# Patient Record
Sex: Female | Born: 1995 | Race: White | Hispanic: No | Marital: Single | Smoking: Never smoker
Health system: Southern US, Community
[De-identification: ages and names within clinical notes are randomized; demographics above are authoritative.]

## PROBLEM LIST (undated history)

## (undated) DIAGNOSIS — S060XAA Concussion with loss of consciousness status unknown, initial encounter: Secondary | ICD-10-CM

## (undated) DIAGNOSIS — F419 Anxiety disorder, unspecified: Secondary | ICD-10-CM

## (undated) DIAGNOSIS — S060X9A Concussion with loss of consciousness of unspecified duration, initial encounter: Secondary | ICD-10-CM

## (undated) DIAGNOSIS — R55 Syncope and collapse: Secondary | ICD-10-CM

## (undated) DIAGNOSIS — T7421XA Adult sexual abuse, confirmed, initial encounter: Secondary | ICD-10-CM

## (undated) DIAGNOSIS — Z20828 Contact with and (suspected) exposure to other viral communicable diseases: Secondary | ICD-10-CM

## (undated) DIAGNOSIS — J45909 Unspecified asthma, uncomplicated: Secondary | ICD-10-CM

## (undated) DIAGNOSIS — G43909 Migraine, unspecified, not intractable, without status migrainosus: Secondary | ICD-10-CM

## (undated) DIAGNOSIS — A770 Spotted fever due to Rickettsia rickettsii: Secondary | ICD-10-CM

## (undated) HISTORY — DX: Unspecified asthma, uncomplicated: J45.909

## (undated) HISTORY — DX: Spotted fever due to Rickettsia rickettsii: A77.0

## (undated) HISTORY — DX: Concussion with loss of consciousness status unknown, initial encounter: S06.0XAA

## (undated) HISTORY — DX: Adult sexual abuse, confirmed, initial encounter: T74.21XA

## (undated) HISTORY — DX: Syncope and collapse: R55

## (undated) HISTORY — DX: Anxiety disorder, unspecified: F41.9

## (undated) HISTORY — DX: Contact with and (suspected) exposure to other viral communicable diseases: Z20.828

## (undated) HISTORY — DX: Migraine, unspecified, not intractable, without status migrainosus: G43.909

## (undated) HISTORY — PX: TONSILLECTOMY AND ADENOIDECTOMY: SHX28

## (undated) HISTORY — DX: Concussion with loss of consciousness of unspecified duration, initial encounter: S06.0X9A

---
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
## 2015-01-14 ENCOUNTER — Other Ambulatory Visit: Payer: Self-pay | Admitting: Nurse Practitioner

## 2015-01-14 MED ORDER — DESOGESTREL-ETHINYL ESTRADIOL 0.15-0.02/0.01 MG (21/5) PO TABS: 1.0000 | ORAL_TABLET | Freq: Every day | ORAL | Status: DC

## 2015-01-14 NOTE — Telephone Encounter (Signed)
pts mother scheduled pts aex with dr Quincy Simmonds for 6/24. Pt normally sees [facility], but not able to wait until next available due to school schedule. Pts mother requests refill on birth control until appointment. Pt will run out this week.  [facility] [address]

## 2015-01-14 NOTE — Telephone Encounter (Signed)
Medication refill request: Desiree Carlson (Birth Control) Last AEX:   Next AEX: 01-24-15 Last MMG (if hormonal medication request): age 20 Refill authorized: please advise

## 2015-01-24 ENCOUNTER — Encounter: Payer: Self-pay | Admitting: Obstetrics and Gynecology

## 2015-01-24 ENCOUNTER — Ambulatory Visit (INDEPENDENT_AMBULATORY_CARE_PROVIDER_SITE_OTHER): Payer: BLUE CROSS/BLUE SHIELD | Admitting: Obstetrics and Gynecology

## 2015-01-24 VITALS — BP 110/68 | HR 68 | Resp 18 | Ht 67.5 in | Wt 191.0 lb

## 2015-01-24 DIAGNOSIS — Z113 Encounter for screening for infections with a predominantly sexual mode of transmission: Secondary | ICD-10-CM | POA: Diagnosis not present

## 2015-01-24 DIAGNOSIS — Z01419 Encounter for gynecological examination (general) (routine) without abnormal findings: Secondary | ICD-10-CM

## 2015-01-24 MED ORDER — NORELGESTROMIN-ETH ESTRADIOL 150-35 MCG/24HR TD PTWK: 1.0000 | MEDICATED_PATCH | TRANSDERMAL | Status: DC

## 2015-01-24 NOTE — Patient Instructions (Signed)

## 2015-01-24 NOTE — Progress Notes (Signed)
19 y.o. G0P0 Single Caucasian female here for annual exam.    Studying at [employer].  Works as Automotive engineer at [employer].   Became sexually active in the last year.   Less cramping with birth control pills than off of them.  Difficulty with taking it on time.  Can vary by 3 - 4 hours. Completely forgot until the next day 3 - 4 times.   PCP:   Patsi Sears   Patient's last menstrual period was 01/17/2015.          Sexually active: Yes.    The current method of family planning is OCP (estrogen/progesterone).    Exercising: Yes.    cardio, weights, Sports  Smoker:  no  Health Maintenance: Pap:  N/A Gardasil Completed 2014  TDaP:  UTD Screening Labs:  Hb today: PCP, Urine today: PCP   reports that she has never smoked. She has never used smokeless tobacco. She reports that she does not drink alcohol or use illicit drugs.  Past Medical History  Diagnosis Date  . Vasovagal syncope     under cardiology care  . Concussion     playing soccer  . Asthma     usually sports induced    Past Surgical History  Procedure Laterality Date  . Tonsillectomy and adenoidectomy      obstructing airway    Current Outpatient Prescriptions  Medication Sig Dispense Refill  . amitriptyline (ELAVIL) 50 MG tablet Take 1 tablet (50 mg total) by mouth at bedtime. (Take 25 mg by mouth every night for the first 2 weeks) 30 tablet 3  . cetirizine (ZYRTEC) 10 MG tablet Take 10 mg by mouth at bedtime.    Marland Kitchen desogestrel-ethinyl estradiol (KARIVA,AZURETTE,MIRCETTE) 0.15-0.02/0.01 MG (21/5) tablet Take 1 tablet by mouth daily. 1 Package 0  . Magnesium Oxide 500 MG TABS Take by mouth.    . Melatonin 5 MG TABS Take by mouth.    . montelukast (SINGULAIR) 10 MG tablet Take 1 tablet by mouth daily.    . mupirocin ointment (BACTROBAN) 2 % APPLY TO AFFECTED SKIN 3 TIMES A DAY  2  . vitamin B-12 (CYANOCOBALAMIN) 1000 MCG tablet Take 1,000 mcg by mouth daily.    . cephALEXin (KEFLEX) 500 MG capsule      . Coenzyme Q10 200 MG TABS Take by mouth.    . propranolol (INDERAL) 20 MG tablet Take 1 tablet (20 mg total) by mouth 2 (two) times daily. (Start with one tablet by mouth each bedtime for the first week) (Patient not taking: Reported on 01/24/2015) 60 tablet 3   No current facility-administered medications for this visit.    Family History  Problem Relation Age of Onset  . Hyperlipidemia Father   . Supraventricular tachycardia Maternal Grandmother   . COPD Paternal Grandmother   . Migraines Paternal Grandmother   . Diabetes Paternal Grandfather   . Seizures Mother     Had 2 febrile seizures as a child  . Anxiety disorder Brother   . Anxiety disorder Other     Maternal 1st Cousin    ROS:  Pertinent items are noted in HPI.  Otherwise, a comprehensive ROS was negative.  Exam:   BP 110/68 mmHg  Pulse 68  Resp 18  Ht 5' 7.5" (1.715 m)  Wt 191 lb (86.637 kg)  BMI 29.46 kg/m2  LMP 01/17/2015    General appearance: alert, cooperative and appears stated age Head: Normocephalic, without obvious abnormality, atraumatic Neck: no adenopathy, supple, symmetrical, trachea midline  and thyroid normal to inspection and palpation Lungs: clear to auscultation bilaterally Breasts: normal appearance, no masses or tenderness, Inspection negative, No nipple retraction or dimpling, No nipple discharge or bleeding, No axillary or supraclavicular adenopathy Heart: regular rate and rhythm Abdomen: soft, non-tender; bowel sounds normal; no masses,  no organomegaly Extremities: extremities normal, atraumatic, no cyanosis or edema Skin: Skin color, texture, turgor normal. No rashes or lesions Lymph nodes: Cervical, supraclavicular, and axillary nodes normal. No abnormal inguinal nodes palpated Neurologic: Grossly normal  Pelvic: External genitalia:  no lesions              Urethra:  normal appearing urethra with no masses, tenderness or lesions              Bartholins and Skenes: normal                  Vagina: normal appearing vagina with normal color and discharge, no lesions              Cervix: no lesions              Pap taken: No. Bimanual Exam:  Uterus:  normal size, contour, position, consistency, mobility, non-tender              Adnexa: normal adnexa and no mass, fullness, tenderness              Rectovaginal: No..  Confirms.              Anus:  normal sphincter tone, no lesions  Chaperone was present for exam.  Assessment:   Well woman visit with normal exam. Need for STD testing.  Need for reliable contraception.   Plan: Yearly mammogram recommended after age 17.  Recommended self breast exam.  Pap and HR HPV as above.  Due at age 3. Discussed Calcium, Vitamin D, regular exercise program including cardiovascular and weight bearing exercise. Labs performed.  Yes.  .   See orders.  GC/CT done.  Future orders for serum STD testing.  Refills given on medications.  Yes.  .  See orders.  Switch to Ortho Evra.  Instructed in use.  May consider the [facility] IUD. Condoms discussed. Follow up annually and prn.     After visit summary provided.

## 2015-01-29 LAB — IPS N GONORRHOEA AND CHLAMYDIA BY PCR

## 2015-01-30 ENCOUNTER — Telehealth: Payer: Self-pay

## 2015-01-30 NOTE — Telephone Encounter (Signed)
Called patient at [phone] to discuss Neg. GC/CT results, LMOVM to call me back.

## 2015-01-30 NOTE — Telephone Encounter (Signed)
Patient notified GC/CT negative.

## 2015-01-30 NOTE — Telephone Encounter (Signed)
-----   Message from Nunzio Cobbs, MD sent at 01/30/2015  9:37 AM EDT ----- Please report negative GC/CT to patient.

## 2015-02-10 ENCOUNTER — Other Ambulatory Visit (INDEPENDENT_AMBULATORY_CARE_PROVIDER_SITE_OTHER): Payer: BLUE CROSS/BLUE SHIELD

## 2015-02-10 DIAGNOSIS — Z113 Encounter for screening for infections with a predominantly sexual mode of transmission: Secondary | ICD-10-CM

## 2015-02-11 LAB — STD PANEL
HIV: NONREACTIVE
Hepatitis B Surface Ag: NEGATIVE

## 2015-02-11 LAB — HEPATITIS C ANTIBODY: HCV AB: NEGATIVE

## 2015-03-04 ENCOUNTER — Other Ambulatory Visit: Payer: Self-pay | Admitting: Obstetrics and Gynecology

## 2015-03-04 NOTE — Telephone Encounter (Signed)
I spoke to patient and advised Dr. Quincy Simmonds sent in one year supply of Ortho Evra on 01/24/15.  Advised [facility]  [facility]

## 2015-03-04 NOTE — Telephone Encounter (Signed)
Patient is requesting 3 refills of "patches". confirmed pharmacy on file.

## 2015-03-12 DIAGNOSIS — A770 Spotted fever due to Rickettsia rickettsii: Secondary | ICD-10-CM | POA: Insufficient documentation

## 2015-03-27 ENCOUNTER — Telehealth: Payer: Self-pay | Admitting: Obstetrics and Gynecology

## 2015-03-27 NOTE — Telephone Encounter (Signed)
I think it would be good for the patient to call office directly.  I would recommend a pregnancy test if patient is sexually active and call if the test is positive.  Does she want to switch back to an OCP?

## 2015-03-27 NOTE — Telephone Encounter (Signed)
Spoke with patient's mother Desiree Carlson. Okay per ROI. Mother states that daughter has been experiencing increased pelvic cramping like she is going to start her cycle and abdominal cramping ever since starting the Ortho Evra patch. Patient started the patch on 02/17/2015. Mother is unable to advise if patient has been changing her patch weekly and alternating locations. Patient is away at college and in class so she is unable to talk. Mother states that patient is having intermittent sharp pains around the area of the patch. Patient was previously on OCP and had mild cramping. Patient is not experiencing any nausea, vomiting, or fever per mother. Advised will speak with Dr.Silva regarding patient's symptoms and return call. Mother is agreeable.

## 2015-03-27 NOTE — Telephone Encounter (Signed)
Spoke with patient's mother Abigail Butts. Mother states that patient is in class today until 4 pm. She will notify her daughter to call the office after class. Advised phones are on today until 4:30 pm. Mother is agreeable.

## 2015-03-27 NOTE — Telephone Encounter (Signed)
Patient's mom "Abigail Butts" calling to discuss patient problems with her "patch". Abigail Butts says her daughter "keeps texting her that she is in pain and believes it is due to her patch". Dpr on file to talk with mom. Last seen 02/10/15.

## 2015-03-27 NOTE — Telephone Encounter (Signed)
Patient's mom, Abigail Butts, called back and said, "My daughter has been changing her patch weekly and switching it from side to side. She continues to have abdominal pain and uterine pain."

## 2015-03-31 NOTE — Telephone Encounter (Signed)
Left message to call Kaitlyn at [phone]. 

## 2015-04-03 NOTE — Telephone Encounter (Signed)
Left message to call Kaitlyn at [phone]. 

## 2015-04-09 NOTE — Telephone Encounter (Signed)
Spoke with patient's mother Desiree Carlson. Desiree Carlson states she is calling for refills on patient's Ortho Evra. Advised mother patient was given 12 refills at aex. Mother is agreeable and will have this rx transferred closer to the patient's school. Advised mother that if patient is still having problems with patch she needs to be seen for evaluation. Mother states that the patient does not want to come off of the patch at this time but may want to try alternatives at a later time. Mother is asking if patient could email or text our office. Advised may use mychart to communicate with our office. Mychart message access code sent for patient sign up. Mother will have patient sign up for future use. Advised patient will need to call our office with any concerns or problems. Can also seek urgent care or care at school if needed. Mother is agreeable.  Routing to Dr.Silva for review before closing encounter. I have been unable to reach the patient regarding her symptoms.

## 2015-04-09 NOTE — Telephone Encounter (Signed)
DPI form has been followed correctly.  Patient responsibility to call or contact us for problems.  Great suggesting for use of My Chart for patient!  OK to close encounter.

## 2015-05-06 ENCOUNTER — Telehealth: Payer: Self-pay | Admitting: Nurse Practitioner

## 2015-05-06 NOTE — Telephone Encounter (Signed)
Np/cx/rd for med reck. Spoke with Desiree Carlson she will have her mom to call back to reschhedule appt.

## 2015-05-07 ENCOUNTER — Ambulatory Visit: Payer: BLUE CROSS/BLUE SHIELD | Admitting: Nurse Practitioner

## 2015-05-08 NOTE — Telephone Encounter (Signed)
Patient canceled her appointment for med reck will call back to reschedule.

## 2015-05-09 ENCOUNTER — Ambulatory Visit: Payer: BLUE CROSS/BLUE SHIELD | Admitting: Nurse Practitioner

## 2015-08-12 ENCOUNTER — Ambulatory Visit: Payer: Self-pay | Admitting: Internal Medicine

## 2015-09-10 ENCOUNTER — Encounter: Payer: Self-pay | Admitting: *Deleted

## 2015-09-19 ENCOUNTER — Ambulatory Visit (INDEPENDENT_AMBULATORY_CARE_PROVIDER_SITE_OTHER): Payer: BLUE CROSS/BLUE SHIELD | Admitting: Internal Medicine

## 2015-09-19 ENCOUNTER — Encounter: Payer: Self-pay | Admitting: Internal Medicine

## 2015-09-19 VITALS — BP 116/72 | HR 63 | Ht 68.5 in | Wt 190.0 lb

## 2015-09-19 DIAGNOSIS — R55 Syncope and collapse: Secondary | ICD-10-CM | POA: Diagnosis not present

## 2015-09-19 NOTE — Progress Notes (Signed)
HPI [name] is referred today by Dr. Velna Hatchet for evaluation of syncope. She is a pleasant 20 yo woman with a h/o syncope dating back 3 years. She notes that she has passed out at least 6 times. The episodes are somewhat variable but usually associated with upright posture and related to HA. Her last episode occurred after standing for many hours at a concert. She did not have much fluid and it was warm. She was leaving the concert with a friend and passed out. She feels associated nausea and vomiting. HA is a prominent component and typically occurs after the episodes. She will feel washed out for up to a day or two. She has not lost continence or bitten her tongue. No associated sensation of palpitations. They have never occurred while driving or sitting. No Known Allergies   Current Outpatient Prescriptions  Medication Sig Dispense Refill  . cetirizine (ZYRTEC) 10 MG tablet Take 10 mg by mouth at bedtime.    . Melatonin 5 MG TABS Take by mouth.    . montelukast (SINGULAIR) 10 MG tablet Take 1 tablet by mouth daily.    . norelgestromin-ethinyl estradiol (ORTHO EVRA) 150-35 MCG/24HR transdermal patch Place 1 patch onto the skin once a week. 3 patch 12  . propranolol (INDERAL) 20 MG tablet Take 1 tablet (20 mg total) by mouth 2 (two) times daily. (Start with one tablet by mouth each bedtime for the first week) 60 tablet 3  . vitamin B-12 (CYANOCOBALAMIN) 1000 MCG tablet Take 1,000 mcg by mouth daily.     No current facility-administered medications for this visit.     Past Medical History  Diagnosis Date  . Vasovagal syncope     under cardiology care  . Concussion     playing soccer  . Asthma     usually sports induced    ROS:   All systems reviewed and negative except as noted in the HPI.   Past Surgical History  Procedure Laterality Date  . Tonsillectomy and adenoidectomy      obstructing airway     Family History  Problem Relation Age of Onset  .  Supraventricular tachycardia Maternal Grandmother   . COPD Paternal Grandmother   . Migraines Paternal Grandmother   . Diabetes Paternal Grandfather   . Seizures Mother     Had 2 febrile seizures as a child  . Anxiety disorder Brother   . Anxiety disorder Other     Maternal 1st Cousin  . Hyperlipidemia Father   . Tuberculosis Mother      Social History   Social History  . Marital Status: Single    Spouse Name: N/A  . Number of Children: N/A  . Years of Education: N/A   Occupational History  . Not on file.   Social History Main Topics  . Smoking status: Never Smoker   . Smokeless tobacco: Never Used  . Alcohol Use: No  . Drug Use: No  . Sexual Activity: Yes    Birth Control/ Protection: Pill   Other Topics Concern  . Not on file   Social History Narrative     BP 116/72 mmHg  Pulse 63  Ht 5' 8.5" (1.74 m)  Wt 190 lb (86.183 kg)  BMI 28.47 kg/m2 Orthostatic vitals: HR went from 60 to 80 with standing. Blood pressure dropped almost 10 mmHg with standing. No hypotension however. Physical Exam:  Well appearing young woman, NAD HEENT: Unremarkable Neck:  6 cm JVD, no thyromegally  Lymphatics:  No adenopathy Back:  No CVA tenderness Lungs:  Clear with no wheezes HEART:  Regular rate rhythm, no murmurs, no rubs, no clicks Abd:  soft, positive bowel sounds, no organomegally, no rebound, no guarding Ext:  2 plus pulses, no edema, no cyanosis, no clubbing Skin:  No rashes no nodules Neuro:  CN II through XII intact, motor grossly intact  EKG - nsr with no pre-excitation   Assess/Plan: 1. Syncope 2. Autonomic dysfunction/vasovagal syncope 3. Migraine HA's. Rec: Today we discussed the pathophysiology of autonomic dysfunction/Vasovagal syncope in detail. I have recommended she avoid the usual noxious stimuli as much as possible, drink plenty of fluids and eat plenty of salt, avoid ETOH and caffeine, not miss meals, and most importantly, when she feels a spell coming  on, to lie down. I will see her back on an as needed basis.  There was a question about anti-anxiety meds to help with situational anxiety that she experiences. From my perspective these medications will not make her spells worse but would need to be prescribed with great caution. I will defer to her medical/HA MD's.   Gregg Taylor,M.D.  Mikle Bosworth.D.

## 2015-09-19 NOTE — Patient Instructions (Addendum)
Medication Instructions:  Your physician recommends that you continue on your current medications as directed. Please refer to the Current Medication list given to you today.  Labwork: None ordered  Testing/Procedures: None ordered  Follow-Up: No follow up is needed at this time with Dr. Lovena Le.  He will see you on an as needed basis.  Any Other Special Instructions Will Be Listed Below (If Applicable). POTS booklet given to you today.  If you need a refill on your cardiac medications before your next appointment, please call your pharmacy.  Thank you for choosing [facility]!!

## 2015-09-29 ENCOUNTER — Other Ambulatory Visit: Payer: Self-pay | Admitting: Obstetrics and Gynecology

## 2015-10-05 ENCOUNTER — Encounter (HOSPITAL_BASED_OUTPATIENT_CLINIC_OR_DEPARTMENT_OTHER): Payer: Self-pay | Admitting: *Deleted

## 2015-10-05 ENCOUNTER — Emergency Department (HOSPITAL_BASED_OUTPATIENT_CLINIC_OR_DEPARTMENT_OTHER): Payer: BLUE CROSS/BLUE SHIELD

## 2015-10-05 ENCOUNTER — Emergency Department (HOSPITAL_BASED_OUTPATIENT_CLINIC_OR_DEPARTMENT_OTHER)
Admission: EM | Admit: 2015-10-05 | Discharge: 2015-10-05 | Disposition: A | Discharge status: Home / Self Care | Payer: BLUE CROSS/BLUE SHIELD | Attending: Emergency Medicine | Admitting: Emergency Medicine

## 2015-10-05 DIAGNOSIS — R1010 Upper abdominal pain, unspecified: Secondary | ICD-10-CM | POA: Diagnosis present

## 2015-10-05 DIAGNOSIS — R1013 Epigastric pain: Secondary | ICD-10-CM

## 2015-10-05 DIAGNOSIS — Z79899 Other long term (current) drug therapy: Secondary | ICD-10-CM | POA: Diagnosis not present

## 2015-10-05 DIAGNOSIS — J45909 Unspecified asthma, uncomplicated: Secondary | ICD-10-CM | POA: Diagnosis not present

## 2015-10-05 DIAGNOSIS — Z792 Long term (current) use of antibiotics: Secondary | ICD-10-CM | POA: Diagnosis not present

## 2015-10-05 DIAGNOSIS — Z3202 Encounter for pregnancy test, result negative: Secondary | ICD-10-CM | POA: Diagnosis not present

## 2015-10-05 DIAGNOSIS — Z87828 Personal history of other (healed) physical injury and trauma: Secondary | ICD-10-CM | POA: Diagnosis not present

## 2015-10-05 LAB — COMPREHENSIVE METABOLIC PANEL
ALBUMIN: 4 g/dL (ref 3.5–5.0)
ALT: 17 U/L (ref 14–54)
AST: 18 U/L (ref 15–41)
Alkaline Phosphatase: 56 U/L (ref 38–126)
Anion gap: 10 (ref 5–15)
BUN: 14 mg/dL (ref 6–20)
CHLORIDE: 103 mmol/L (ref 101–111)
CO2: 24 mmol/L (ref 22–32)
CREATININE: 0.86 mg/dL (ref 0.44–1.00)
Calcium: 8.9 mg/dL (ref 8.9–10.3)
GFR calc Af Amer: >60 mL/min (ref >60)
GFR calc non Af Amer: >60 mL/min (ref >60)
Glucose, Bld: 99 mg/dL (ref 65–99)
Potassium: 4.1 mmol/L (ref 3.5–5.1)
SODIUM: 137 mmol/L (ref 135–145)
Total Bilirubin: 0.4 mg/dL (ref 0.3–1.2)
Total Protein: 7.5 g/dL (ref 6.5–8.1)

## 2015-10-05 LAB — CBC WITH DIFFERENTIAL/PLATELET
Basophils Absolute: 0 10*3/uL (ref 0.0–0.1)
Basophils Relative: 0 %
EOS ABS: 0.1 10*3/uL (ref 0.0–0.7)
Eosinophils Relative: 1 %
HEMATOCRIT: 36.7 % (ref 36.0–46.0)
HEMOGLOBIN: 12.2 g/dL (ref 12.0–15.0)
LYMPHS ABS: 1.6 10*3/uL (ref 0.7–4.0)
Lymphocytes Relative: 16 %
MCH: 28.6 pg (ref 26.0–34.0)
MCHC: 33.2 g/dL (ref 30.0–36.0)
MCV: 85.9 fL (ref 78.0–100.0)
MONO ABS: 0.6 10*3/uL (ref 0.1–1.0)
MONOS PCT: 6 %
NEUTROS ABS: 7.9 10*3/uL — AB (ref 1.7–7.7)
NEUTROS PCT: 77 %
Platelets: 272 10*3/uL (ref 150–400)
RBC: 4.27 MIL/uL (ref 3.87–5.11)
RDW: 12.8 % (ref 11.5–15.5)
WBC: 10.2 10*3/uL (ref 4.0–10.5)

## 2015-10-05 LAB — URINALYSIS, ROUTINE W REFLEX MICROSCOPIC
Bilirubin Urine: NEGATIVE
GLUCOSE, UA: NEGATIVE mg/dL
HGB URINE DIPSTICK: NEGATIVE
KETONES UR: NEGATIVE mg/dL
LEUKOCYTES UA: NEGATIVE
Nitrite: NEGATIVE
PH: 6.5 (ref 5.0–8.0)
Protein, ur: NEGATIVE mg/dL
Specific Gravity, Urine: 1.009 (ref 1.005–1.030)

## 2015-10-05 LAB — PREGNANCY, URINE: Preg Test, Ur: NEGATIVE

## 2015-10-05 LAB — LIPASE, BLOOD: Lipase: 30 U/L (ref 11–51)

## 2015-10-05 MED ORDER — IBUPROFEN 400 MG PO TABS
600.0000 mg | ORAL_TABLET | Freq: Once | ORAL | Status: AC
  Administered 2015-10-05: 600 mg via ORAL
  Filled 2015-10-05: qty 1

## 2015-10-05 NOTE — ED Notes (Signed)
Pt reports upper abd pain that began yesterday, worse w/ movement and deep inspiration - pt denies any n/v/d, vaginal discharge/bleeding or fever. Pt admits she was seen on Friday for sinus infection and pink eye - pt was started on amoxicillin at that time.

## 2015-10-05 NOTE — Discharge Instructions (Signed)
We did not find serious cause of your pain today. The pain that you describe may be more musculoskeletal in nature. Continue to take tylenol and motrin as needed for pain control  Return without fail for worsening symptoms, including fever, vomiting and unable to keep down food/fluids, worsening pain, or any other symptoms concerning to you.   Abdominal Pain, Adult Many things can cause belly (abdominal) pain. Most times, the belly pain is not dangerous. Many cases of belly pain can be watched and treated at home. HOME CARE   Do not take medicines that help you go poop (laxatives) unless told to by your doctor.  Only take medicine as told by your doctor.  Eat or drink as told by your doctor. Your doctor will tell you if you should be on a special diet. GET HELP IF:  You do not know what is causing your belly pain.  You have belly pain while you are sick to your stomach (nauseous) or have runny poop (diarrhea).  You have pain while you pee or poop.  Your belly pain wakes you up at night.  You have belly pain that gets worse or better when you eat.  You have belly pain that gets worse when you eat fatty foods.  You have a fever. GET HELP RIGHT AWAY IF:   The pain does not go away within 2 hours.  You keep throwing up (vomiting).  The pain changes and is only in the right or left part of the belly.  You have bloody or tarry looking poop. MAKE SURE YOU:   Understand these instructions.  Will watch your condition.  Will get help right away if you are not doing well or get worse.   This information is not intended to replace advice given to you by your health care provider. Make sure you discuss any questions you have with your health care provider.   Document Released: 01/05/2008 Document Revised: 08/09/2014 Document Reviewed: 03/28/2013 Elsevier Interactive Patient Education [employer].

## 2015-10-05 NOTE — ED Notes (Signed)
Patient transported to X-ray 

## 2015-10-05 NOTE — ED Notes (Signed)
DC instructions reviewed with pt, opportunity for questions provided

## 2015-10-05 NOTE — ED Provider Notes (Signed)
CSN: [id number]     Arrival date & time 10/05/15  1323 History   First MD Initiated Contact with Patient 10/05/15 1339     Chief Complaint  Patient presents with  . Abdominal Pain     (Consider location/radiation/quality/duration/timing/severity/associated sxs/prior Treatment) HPI 20 year old female who presents with epigastric abdominal pain. History of asthma and no prior abdominal surgeries. States that had upper abdominal pain yesterday while at rest, initially noted when she was moving. States that she also noticed that pain was worsened when she took a deep breath in. Just prior to this also noted chest pain underneath her left breast that also was worse with movement and deep breathing prior to onset of abdominal pain. Has not had any nausea, vomiting, diarrhea, dysuria, urinary frequency hematuria, flank pain or back pain, vaginal bleeding or abnormal vaginal discharge. Recently has had of viral sinus infection and has had nasal congestion and runny nose. Had mild nonproductive cough but no fevers or chills. No difficulty breathing. No recent immobilization, recent traveling, personal or family history of PE/DVT, lower extremity edema or pain. Past Medical History  Diagnosis Date  . Vasovagal syncope     under cardiology care  . Concussion     playing soccer  . Asthma     usually sports induced   Past Surgical History  Procedure Laterality Date  . Tonsillectomy and adenoidectomy      obstructing airway   Family History  Problem Relation Age of Onset  . Supraventricular tachycardia Maternal Grandmother   . COPD Paternal Grandmother   . Migraines Paternal Grandmother   . Diabetes Paternal Grandfather   . Seizures Mother     Had 2 febrile seizures as a child  . Anxiety disorder Brother   . Anxiety disorder Other     Maternal 1st Cousin  . Hyperlipidemia Father   . Tuberculosis Mother    Social History  Substance Use Topics  . Smoking status: Never Smoker   . Smokeless  tobacco: Never Used  . Alcohol Use: No   OB History    Gravida Para Term Preterm AB TAB SAB Ectopic Multiple Living   0 0             Review of Systems 10/14 systems reviewed and are negative other than those stated in the HPI   Allergies  Review of patient's allergies indicates no known allergies.  Home Medications   Prior to Admission medications   Medication Sig Start Date End Date Taking? Authorizing Provider  amoxicillin-clavulanate (AUGMENTIN) 875-125 MG tablet Take 1 tablet by mouth 2 (two) times daily.   Yes Historical Provider, MD  cetirizine (ZYRTEC) 10 MG tablet Take 10 mg by mouth at bedtime.   Yes Historical Provider, MD  Melatonin 5 MG TABS Take by mouth.   Yes Historical Provider, MD  montelukast (SINGULAIR) 10 MG tablet Take 1 tablet by mouth daily. 12/13/12  Yes Historical Provider, MD  tobramycin (TOBREX) 0.3 % ophthalmic ointment 1 application 3 (three) times daily.   Yes Historical Provider, MD  Marilu Favre 150-35 MCG/24HR transdermal patch PLACE 1 PATCH ONTO THE SKIN ONCE A WEEK. 09/29/15  Yes Brook Oletta Lamas, MD  propranolol (INDERAL) 20 MG tablet Take 1 tablet (20 mg total) by mouth 2 (two) times daily. (Start with one tablet by mouth each bedtime for the first week) 05/29/13   Teressa Lower, MD  vitamin B-12 (CYANOCOBALAMIN) 1000 MCG tablet Take 1,000 mcg by mouth daily.    Historical Provider, MD  BP 128/92 mmHg  Pulse 76  Temp(Src) 98.3 F (36.8 C) (Oral)  Resp 16  Ht 5\' 8"  (1.727 m)  Wt 189 lb (85.73 kg)  BMI 28.74 kg/m2  SpO2 100%  LMP 09/26/2015 Physical Exam Physical Exam  Nursing note and vitals reviewed. Constitutional: Well developed, well nourished, non-toxic, and in no acute distress Head: Normocephalic and atraumatic.  Mouth/Throat: Oropharynx is clear and moist.  Neck: Normal range of motion. Neck supple.  Cardiovascular: Normal rate and regular rhythm.   Pulmonary/Chest: Effort normal and breath sounds normal.  Abdominal: Soft.  There is epigastri tenderness. There is no rebound and no guarding. No tenderness at McBurney's point. Negative Murphy's sign. Musculoskeletal: Normal range of motion.  Neurological: Alert, no facial droop, fluent speech, moves all extremities symmetrically Skin: Skin is warm and dry.  Psychiatric: Cooperative  ED Course  Procedures (including critical care time) Labs Review Labs Reviewed  CBC WITH DIFFERENTIAL/PLATELET - Abnormal; Notable for the following:    Neutro Abs 7.9 (*)    All other components within normal limits  URINALYSIS, ROUTINE W REFLEX MICROSCOPIC (NOT AT [facility])  PREGNANCY, URINE  COMPREHENSIVE METABOLIC PANEL  LIPASE, BLOOD    Imaging Review Dg Chest 2 View  10/05/2015  CLINICAL DATA:  Epigastric abdominal pain. Pain worsens with deep breath. EXAM: CHEST  2 VIEW COMPARISON:  None. FINDINGS: The heart size and mediastinal contours are within normal limits. Both lungs are clear. The visualized skeletal structures are unremarkable. IMPRESSION: No active cardiopulmonary disease. Electronically Signed   By: Kerby Moors M.D.   On: 10/05/2015 14:32   I have personally reviewed and evaluated these images and lab results as part of my medical decision-making.   EKG Interpretation None      MDM   Final diagnoses:  Epigastric abdominal pain    20 year old female who presents with epigastric abdominal pain. On presentation she is well-appearing and in no acute distress. Vital signs are non-concerning here in the emergency department. She overall has a soft and benign abdomen, with epigastric tenderness to palpation. Also noted epigastric tenderness and right lower chest wall pain with movement, which makes pain seems more musculoskeletal in nature. Unremarkable CBC, comprehensive metabolic panel, lipase, urinalysis and urine pregnancy test. PERC negative and ruled out for PE. EKG unremarkable. CXR unremarkable. Given motrin for pain control. At this time, feel unlikely  that this is a serious intraabdominal or intrathoracic process. Appropriate for discharge home. Strict return and follow-up instructions reviewed. She expressed understanding of all discharge instructions and felt comfortable with the plan of care.     Forde Dandy, MD 10/05/15 [phone]

## 2015-10-21 ENCOUNTER — Telehealth: Payer: Self-pay | Admitting: Nurse Practitioner

## 2015-10-21 NOTE — Telephone Encounter (Signed)
Patient's mom Abigail Butts (dpr on file) would like to speak with nurse regarding patient wanting a prescription for an anxiety medication.

## 2015-10-21 NOTE — Telephone Encounter (Signed)
DPR confirmed to speak to mother, Desiree Carlson. Return call to mother, she is at work and is on conference call during this phone call. She reports she concerned about her daughter who is a Doctor, hospital at [employer] and she is experiencing more than normal amount of stress for college student. Desiree Carlson states she does not feel like this is related to the birth control patch because this has been "on-going and escalating over the last year."  Desiree Carlson states she is worried about her daughter's mental state. She reports she has crying jags, hopelessness, too much pressure on herself and feels desperate. Asked mother if she has specifically asked [name] if she has thoughts to harm herself or others. Mother states she has asked and patient denies this but has asked her mom to get her something to help her. Mother states [name] felt comfortable talking to [name] so they requested [name] speak to [name] by phone and perhaps prescribe something by phone since she is in school in [place] five days a week.  Advised that although patient may need assistance, this is not something we would provide over the phone and generally we do not treat anxiety in young adults due to the potential for misdiagnosis in this age group.    Mother's conferenece call is going on during this and she has limited ability to continue to talk so she interrupts to say " I believe I hear you to say that she would have to come in and likely you wouldn't help her anyway."  Advised that she would need office visit, probably with MD and that cannot say if they would decide to treat or not but again, generally we do not treat anxiety in this age group. Advised that she could see PCP and mother quickly cuts me off stating she will call them, just wanted to start with [name] due to daughter's comfort level and disconnects call.    Call reviewed with Dr Sabra Heck, encounter closed.   CC: Edman Circle, FNP

## 2015-10-24 NOTE — Telephone Encounter (Signed)
Forwarding to Dr. Quincy Simmonds, Juluis Rainier.

## 2016-02-26 ENCOUNTER — Other Ambulatory Visit: Payer: Self-pay | Admitting: Obstetrics and Gynecology

## 2016-02-26 NOTE — Telephone Encounter (Signed)
Medication refill request: Marilu Favre Last AEX:  01/24/15 BS Next AEX: Not scheduled at this time Last MMG (if hormonal medication request): n/a Refill authorized: 09/29/15 #9 Patches 1R. Please advise. Thank you.

## 2016-02-26 NOTE — Telephone Encounter (Signed)
Medication refill request: Xulane Patch  Last AEX:  01-24-15 Next AEX: not scheduled  Last MMG (if hormonal medication request): N/A  Refill authorized: Please advise Called patient to schedule her AEX, she said that she has moved to [facility] and is wanting to get at least one refill to give her time to get in somewhere else

## 2016-02-27 MED ORDER — NORELGESTROMIN-ETH ESTRADIOL 150-35 MCG/24HR TD PTWK: MEDICATED_PATCH | TRANSDERMAL | 0 refills | Status: DC

## 2016-02-27 NOTE — Telephone Encounter (Signed)
Patient has appointment on 03/01/16 with BS. Patient is out of town and is out of medication. Please advise. Thank you.   Pharmacy on file is correct.

## 2016-02-27 NOTE — Addendum Note (Signed)
Addended by: Zoila Shutter D on: 02/27/2016 09:11 AM   Modules accepted: Orders

## 2016-03-01 ENCOUNTER — Ambulatory Visit (INDEPENDENT_AMBULATORY_CARE_PROVIDER_SITE_OTHER): Payer: BLUE CROSS/BLUE SHIELD | Admitting: Obstetrics and Gynecology

## 2016-03-01 ENCOUNTER — Encounter: Payer: Self-pay | Admitting: Obstetrics and Gynecology

## 2016-03-01 VITALS — BP 120/80 | HR 72 | Resp 14 | Ht 67.5 in | Wt 194.4 lb

## 2016-03-01 DIAGNOSIS — Z113 Encounter for screening for infections with a predominantly sexual mode of transmission: Secondary | ICD-10-CM

## 2016-03-01 DIAGNOSIS — Z01419 Encounter for gynecological examination (general) (routine) without abnormal findings: Secondary | ICD-10-CM

## 2016-03-01 MED ORDER — NORELGESTROMIN-ETH ESTRADIOL 150-35 MCG/24HR TD PTWK: MEDICATED_PATCH | TRANSDERMAL | 3 refills | Status: DC

## 2016-03-01 NOTE — Progress Notes (Signed)
20 y.o. G0P0 Single Caucasian female here for annual exam.    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   Now on Effexor for anxiety 2 - 3 weeks ago.  Feels some fatigue.  Has normal thyroid function but it is not hypothyroid per patient.  Feels tired all the time. Will see her PCP back in September for a recheck.  On OrthoEvra.  Thinks she is happy with this.  Cramping can prevent her from wanting to get out of bed.  States she is a fainter when she has blood work done.  PCP: Reginold Agent, NP.    Patient's last menstrual period was 02/19/2016.     Period Cycle (Days): 28 Period Duration (Days): 5  Period Pattern: Regular Menstrual Flow: Heavy, Moderate Menstrual Control: Maxi pad, Tampon Menstrual Control Change Freq (Hours): 2-3 times a day Dysmenorrhea: (!) Moderate Dysmenorrhea Symptoms: Cramping     Sexually active: Yes.    The current method of family planning is Patch.   Condoms.  Exercising: Yes.    Running, Sports, Exercise Classes Smoker:  no  Health Maintenance: Pap: n/a History of abnormal Pap:  no TDaP:  01/2015 Gardasil:   Completed 2014 HIV: 02/10/15 Neg Hep C: 02/10/15 Neg Screening Labs:  Hb today: PCP, Urine today: PCP   reports that she has never smoked. She has never used smokeless tobacco. She reports that she drinks about 1.8 oz of alcohol per week . She reports that she does not use drugs.  Past Medical History:  Diagnosis Date  . Asthma    usually sports induced  . Concussion    playing soccer  . Vasovagal syncope    under cardiology care    Past Surgical History:  Procedure Laterality Date  . TONSILLECTOMY AND ADENOIDECTOMY     obstructing airway    Current Outpatient Prescriptions  Medication Sig Dispense Refill  . ALPRAZolam (XANAX) 0.25 MG tablet TAKE 1 TABLET BY MOUTH TWICE A DAY AS NEEDED FOR ANXIETY  2  . buPROPion (WELLBUTRIN XL) 150 MG 24 hr tablet Take 150 mg by mouth daily.  3  .  cetirizine (ZYRTEC) 10 MG tablet Take 10 mg by mouth at bedtime.    . Melatonin 5 MG TABS Take by mouth.    . montelukast (SINGULAIR) 10 MG tablet Take 1 tablet by mouth daily.    . mupirocin ointment (BACTROBAN) 2 % APPLY ON THE SKIN 3 TIMES A DAY  2  . norelgestromin-ethinyl estradiol (XULANE) 150-35 MCG/24HR transdermal patch PLACE 1 PATCH ONTO THE SKIN ONCE A WEEK. 9 patch 0  . PROAIR RESPICLICK 123XX123 (90 Base) MCG/ACT AEPB INHALE 2 PUFFS EVERY 4-6 HOURS AS NEEDED FOR ASTHMA  3  . venlafaxine XR (EFFEXOR-XR) 75 MG 24 hr capsule TAKE ONE CAPSULE BY MOUTH EVERY DAY *START AFTER FINISHING 37.5MG  DAILY*  1   No current facility-administered medications for this visit.     Family History  Problem Relation Age of Onset  . Supraventricular tachycardia Maternal Grandmother   . COPD Paternal Grandmother   . Migraines Paternal Grandmother   . Diabetes Paternal Grandfather   . Seizures Mother     Had 2 febrile seizures as a child  . Anxiety disorder Brother   . Anxiety disorder Other     Maternal 1st Cousin  . Hyperlipidemia Father   . Tuberculosis Mother     ROS:  Pertinent items are noted in HPI.  Otherwise, a comprehensive ROS was negative.  Exam:  BP 120/80 (BP Location: Right Arm, Patient Position: Sitting, Cuff Size: Normal)   Pulse 72   Resp 14   Ht 5' 7.5" (1.715 m)   Wt 194 lb 6.4 oz (88.2 kg)   LMP 02/19/2016   BMI 30.00 kg/m     General appearance: alert, cooperative and appears stated age Head: Normocephalic, without obvious abnormality, atraumatic Neck: no adenopathy, supple, symmetrical, trachea midline and thyroid normal to inspection and palpation Lungs: clear to auscultation bilaterally Breasts: normal appearance, no masses or tenderness, No nipple retraction or dimpling, No nipple discharge or bleeding, No axillary or supraclavicular adenopathy Heart: regular rate and rhythm Abdomen: soft, non-tender; no masses, no organomegaly Extremities: extremities normal,  atraumatic, no cyanosis or edema Skin: Skin color, texture, turgor normal. No rashes or lesions Lymph nodes: Cervical, supraclavicular, and axillary nodes normal. No abnormal inguinal nodes palpated Neurologic: Grossly normal  Pelvic: External genitalia:  no lesions              Urethra:  normal appearing urethra with no masses, tenderness or lesions              Bartholins and Skenes: normal                 Vagina: normal appearing vagina with normal color and discharge, no lesions              Cervix: no lesions              Pap taken: No. Bimanual Exam:  Uterus:  normal size, contour, position, consistency, mobility, non-tender              Adnexa: no mass, fullness, tenderness             Chaperone was present for exam.  Assessment:   Well woman visit with normal exam. Dysmenorrhea.  Fatigue.  Borderline TFTs per patient.  Anxiety.  Plan: Yearly mammogram recommended after age 41.  Recommended self breast exam.  Pap and HR HPV as above. Discussed Calcium, Vitamin D, MVI, regular exercise program including cardiovascular and weight bearing exercise. Ortho Evra continuous for 9 weeks.  Then have cycle.  Disp:  9 patches, RF 3.  GC/CT probe today.  Can return for serum STD screening.  Will place a future order.  Lab is now closed for today.  She will follow up with her PCP regarding her fatigue.  Can do TFTs then.   Follow up annually and prn.   After visit summary provided.

## 2016-03-01 NOTE — Patient Instructions (Signed)
Health Maintenance, Female Adopting a healthy lifestyle and getting preventive care can go a long way to promote health and wellness. Talk with your health care provider about what schedule of regular examinations is right for you. This is a good chance for you to check in with your provider about disease prevention and staying healthy. In between checkups, there are plenty of things you can do on your own. Experts have done a lot of research about which lifestyle changes and preventive measures are most likely to keep you healthy. Ask your health care provider for more information. WEIGHT AND DIET  Eat a healthy diet  Be sure to include plenty of vegetables, fruits, low-fat dairy products, and lean protein.  Do not eat a lot of foods high in solid fats, added sugars, or salt.  Get regular exercise. This is one of the most important things you can do for your health.  Most adults should exercise for at least 150 minutes each week. The exercise should increase your heart rate and make you sweat (moderate-intensity exercise).  Most adults should also do strengthening exercises at least twice a week. This is in addition to the moderate-intensity exercise.  Maintain a healthy weight  Body mass index (BMI) is a measurement that can be used to identify possible weight problems. It estimates body fat based on height and weight. Your health care provider can help determine your BMI and help you achieve or maintain a healthy weight.  For females 20 years of age and older:   A BMI below 18.5 is considered underweight.  A BMI of 18.5 to 24.9 is normal.  A BMI of 25 to 29.9 is considered overweight.  A BMI of 30 and above is considered obese.  Watch levels of cholesterol and blood lipids  You should start having your blood tested for lipids and cholesterol at 20 years of age, then have this test every 5 years.  You may need to have your cholesterol levels checked more often if:  Your lipid  or cholesterol levels are high.  You are older than 20 years of age.  You are at high risk for heart disease.  CANCER SCREENING   Lung Cancer  Lung cancer screening is recommended for adults 55-80 years old who are at high risk for lung cancer because of a history of smoking.  A yearly low-dose CT scan of the lungs is recommended for people who:  Currently smoke.  Have quit within the past 15 years.  Have at least a 30-pack-year history of smoking. A pack year is smoking an average of one pack of cigarettes a day for 1 year.  Yearly screening should continue until it has been 15 years since you quit.  Yearly screening should stop if you develop a health problem that would prevent you from having lung cancer treatment.  Breast Cancer  Practice breast self-awareness. This means understanding how your breasts normally appear and feel.  It also means doing regular breast self-exams. Let your health care provider know about any changes, no matter how small.  If you are in your 20s or 30s, you should have a clinical breast exam (CBE) by a health care provider every 1-3 years as part of a regular health exam.  If you are 40 or older, have a CBE every year. Also consider having a breast X-ray (mammogram) every year.  If you have a family history of breast cancer, talk to your health care provider about genetic screening.  If you   are at high risk for breast cancer, talk to your health care provider about having an MRI and a mammogram every year.  Breast cancer gene (BRCA) assessment is recommended for women who have family members with BRCA-related cancers. BRCA-related cancers include:  Breast.  Ovarian.  Tubal.  Peritoneal cancers.  Results of the assessment will determine the need for genetic counseling and BRCA1 and BRCA2 testing. Cervical Cancer Your health care provider may recommend that you be screened regularly for cancer of the pelvic organs (ovaries, uterus, and  vagina). This screening involves a pelvic examination, including checking for microscopic changes to the surface of your cervix (Pap test). You may be encouraged to have this screening done every 3 years, beginning at age 21.  For women ages 30-65, health care providers may recommend pelvic exams and Pap testing every 3 years, or they may recommend the Pap and pelvic exam, combined with testing for human papilloma virus (HPV), every 5 years. Some types of HPV increase your risk of cervical cancer. Testing for HPV may also be done on women of any age with unclear Pap test results.  Other health care providers may not recommend any screening for nonpregnant women who are considered low risk for pelvic cancer and who do not have symptoms. Ask your health care provider if a screening pelvic exam is right for you.  If you have had past treatment for cervical cancer or a condition that could lead to cancer, you need Pap tests and screening for cancer for at least 20 years after your treatment. If Pap tests have been discontinued, your risk factors (such as having a new sexual partner) need to be reassessed to determine if screening should resume. Some women have medical problems that increase the chance of getting cervical cancer. In these cases, your health care provider may recommend more frequent screening and Pap tests. Colorectal Cancer  This type of cancer can be detected and often prevented.  Routine colorectal cancer screening usually begins at 20 years of age and continues through 20 years of age.  Your health care provider may recommend screening at an earlier age if you have risk factors for colon cancer.  Your health care provider may also recommend using home test kits to check for hidden blood in the stool.  A small camera at the end of a tube can be used to examine your colon directly (sigmoidoscopy or colonoscopy). This is done to check for the earliest forms of colorectal  cancer.  Routine screening usually begins at age 50.  Direct examination of the colon should be repeated every 5-10 years through 20 years of age. However, you may need to be screened more often if early forms of precancerous polyps or small growths are found. Skin Cancer  Check your skin from head to toe regularly.  Tell your health care provider about any new moles or changes in moles, especially if there is a change in a mole's shape or color.  Also tell your health care provider if you have a mole that is larger than the size of a pencil eraser.  Always use sunscreen. Apply sunscreen liberally and repeatedly throughout the day.  Protect yourself by wearing long sleeves, pants, a wide-brimmed hat, and sunglasses whenever you are outside. HEART DISEASE, DIABETES, AND HIGH BLOOD PRESSURE   High blood pressure causes heart disease and increases the risk of stroke. High blood pressure is more likely to develop in:  People who have blood pressure in the high end   of the normal range (130-139/85-89 mm Hg).  People who are overweight or obese.  People who are African American.  If you are 38-23 years of age, have your blood pressure checked every 3-5 years. If you are 61 years of age or older, have your blood pressure checked every year. You should have your blood pressure measured twice--once when you are at a hospital or clinic, and once when you are not at a hospital or clinic. Record the average of the two measurements. To check your blood pressure when you are not at a hospital or clinic, you can use:  An automated blood pressure machine at a pharmacy.  A home blood pressure monitor.  If you are between 45 years and 39 years old, ask your health care provider if you should take aspirin to prevent strokes.  Have regular diabetes screenings. This involves taking a blood sample to check your fasting blood sugar level.  If you are at a normal weight and have a low risk for diabetes,  have this test once every three years after 20 years of age.  If you are overweight and have a high risk for diabetes, consider being tested at a younger age or more often. PREVENTING INFECTION  Hepatitis B  If you have a higher risk for hepatitis B, you should be screened for this virus. You are considered at high risk for hepatitis B if:  You were born in a country where hepatitis B is common. Ask your health care provider which countries are considered high risk.  Your parents were born in a high-risk country, and you have not been immunized against hepatitis B (hepatitis B vaccine).  You have HIV or AIDS.  You use needles to inject street drugs.  You live with someone who has hepatitis B.  You have had sex with someone who has hepatitis B.  You get hemodialysis treatment.  You take certain medicines for conditions, including cancer, organ transplantation, and autoimmune conditions. Hepatitis C  Blood testing is recommended for:  Everyone born from [birth year] through 1965.  Anyone with known risk factors for hepatitis C. Sexually transmitted infections (STIs)  You should be screened for sexually transmitted infections (STIs) including gonorrhea and chlamydia if:  You are sexually active and are younger than 20 years of age.  You are older than 20 years of age and your health care provider tells you that you are at risk for this type of infection.  Your sexual activity has changed since you were last screened and you are at an increased risk for chlamydia or gonorrhea. Ask your health care provider if you are at risk.  If you do not have HIV, but are at risk, it may be recommended that you take a prescription medicine daily to prevent HIV infection. This is called pre-exposure prophylaxis (PrEP). You are considered at risk if:  You are sexually active and do not regularly use condoms or know the HIV status of your partner(s).  You take drugs by injection.  You are sexually  active with a partner who has HIV. Talk with your health care provider about whether you are at high risk of being infected with HIV. If you choose to begin PrEP, you should first be tested for HIV. You should then be tested every 3 months for as long as you are taking PrEP.  PREGNANCY   If you are premenopausal and you may become pregnant, ask your health care provider about preconception counseling.  If you may  become pregnant, take 400 to 800 micrograms (mcg) of folic acid every day.  If you want to prevent pregnancy, talk to your health care provider about birth control (contraception). OSTEOPOROSIS AND MENOPAUSE   Osteoporosis is a disease in which the bones lose minerals and strength with aging. This can result in serious bone fractures. Your risk for osteoporosis can be identified using a bone density scan.  If you are 61 years of age or older, or if you are at risk for osteoporosis and fractures, ask your health care provider if you should be screened.  Ask your health care provider whether you should take a calcium or vitamin D supplement to lower your risk for osteoporosis.  Menopause may have certain physical symptoms and risks.  Hormone replacement therapy may reduce some of these symptoms and risks. Talk to your health care provider about whether hormone replacement therapy is right for you.  HOME CARE INSTRUCTIONS   Schedule regular health, dental, and eye exams.  Stay current with your immunizations.   Do not use any tobacco products including cigarettes, chewing tobacco, or electronic cigarettes.  If you are pregnant, do not drink alcohol.  If you are breastfeeding, limit how much and how often you drink alcohol.  Limit alcohol intake to no more than 1 drink per day for nonpregnant women. One drink equals 12 ounces of beer, 5 ounces of wine, or 1 ounces of hard liquor.  Do not use street drugs.  Do not share needles.  Ask your health care provider for help if  you need support or information about quitting drugs.  Tell your health care provider if you often feel depressed.  Tell your health care provider if you have ever been abused or do not feel safe at home.   This information is not intended to replace advice given to you by your health care provider. Make sure you discuss any questions you have with your health care provider.   Document Released: 02/01/2011 Document Revised: 08/09/2014 Document Reviewed: 06/20/2013 Elsevier Interactive Patient Education [employer].

## 2016-03-02 LAB — GC/CHLAMYDIA PROBE AMP
CT Probe RNA: NOT DETECTED
GC PROBE AMP APTIMA: NOT DETECTED

## 2016-08-24 ENCOUNTER — Telehealth: Payer: Self-pay | Admitting: Obstetrics and Gynecology

## 2016-08-24 NOTE — Telephone Encounter (Signed)
Patient wants to speak with a nurse about how to get her medications while she is out of the country studying abroad

## 2016-08-24 NOTE — Telephone Encounter (Addendum)
Spoke with patient, advised [facility] to [facility] and [facility]ou[facility], start process few weeks in advance prior to leaving. Patient states she is leaving on Saturday 1/27 and will not return until May 2018, can not pick up prescription early due to insurance. Advised patient she has enough refills remaining , will need to contact [facility] at [phone] for Travel Voucher Override to assist with additional months supply to cover time out of country. Advised patient to return call to office for any additional questions. Patient verbalizes understanding and is agreeable.  Routing to provider for final review. Patient is agreeable to disposition. Will close encounter.

## 2016-08-24 NOTE — Telephone Encounter (Signed)
Spoke with patient. Patient states she will be traveling to [place] at the end of May 2018. Patient states she will be able to pick up last Xulane  refill for 90 day supply prior to leaving, but will still need 1 additional month to get through time away. Patient states unable to have medication mailed to her or pick up additional month early as insurance wont allow. Patient states  Last AEX 03/01/16, next AEX 03/09/17. Advised patient would review with provider and return call with recommendations.  Dr. Quincy Simmonds, any recommendations?

## 2016-09-20 IMAGING — CR DG CHEST 2V
2 series · 2 of 2 positions shown · non-contrast
Comparison: None.

CLINICAL DATA: Epigastric abdominal pain. Pain worsens with deep
breath.

EXAM:
CHEST  2 VIEW

[w chest pa]
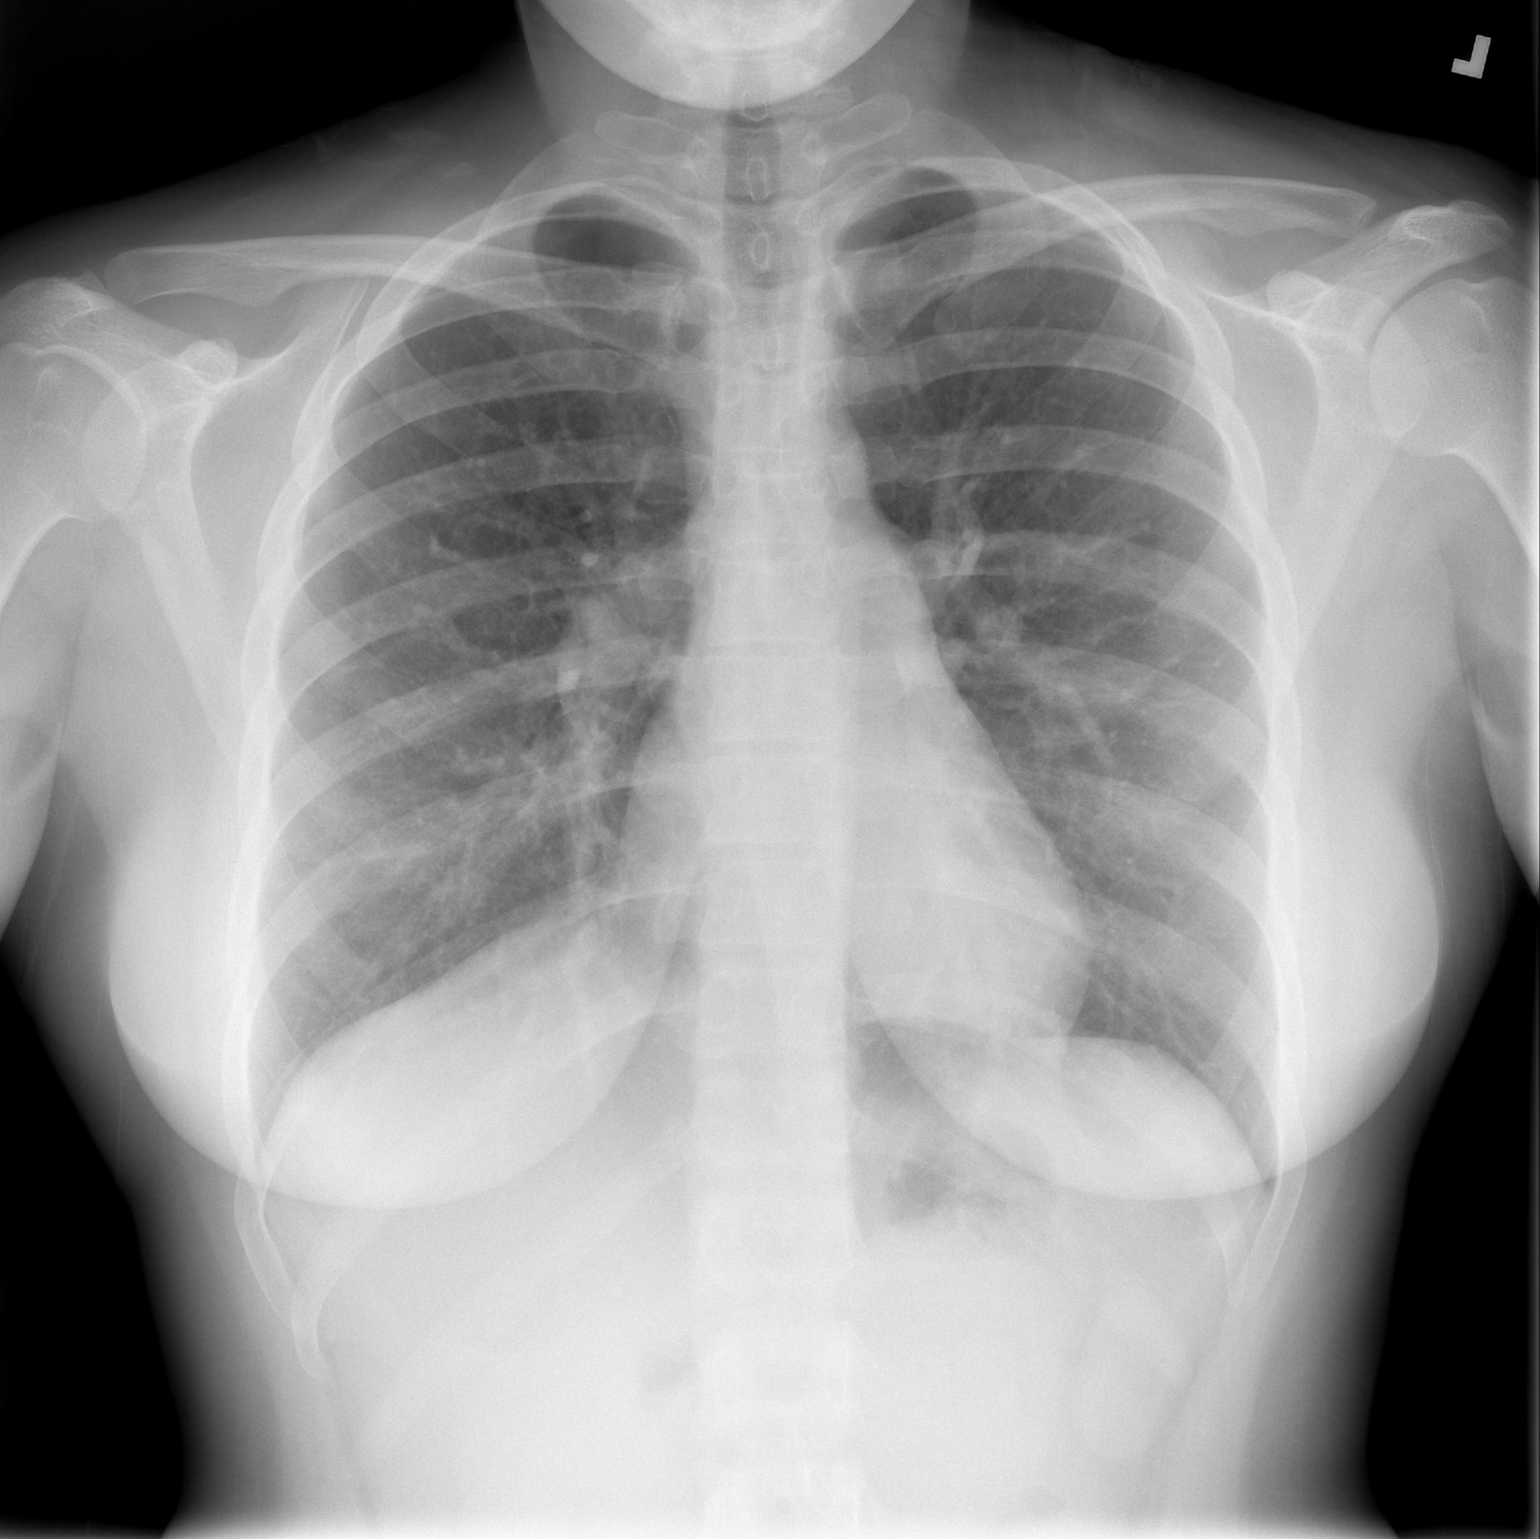

[w chest lat]
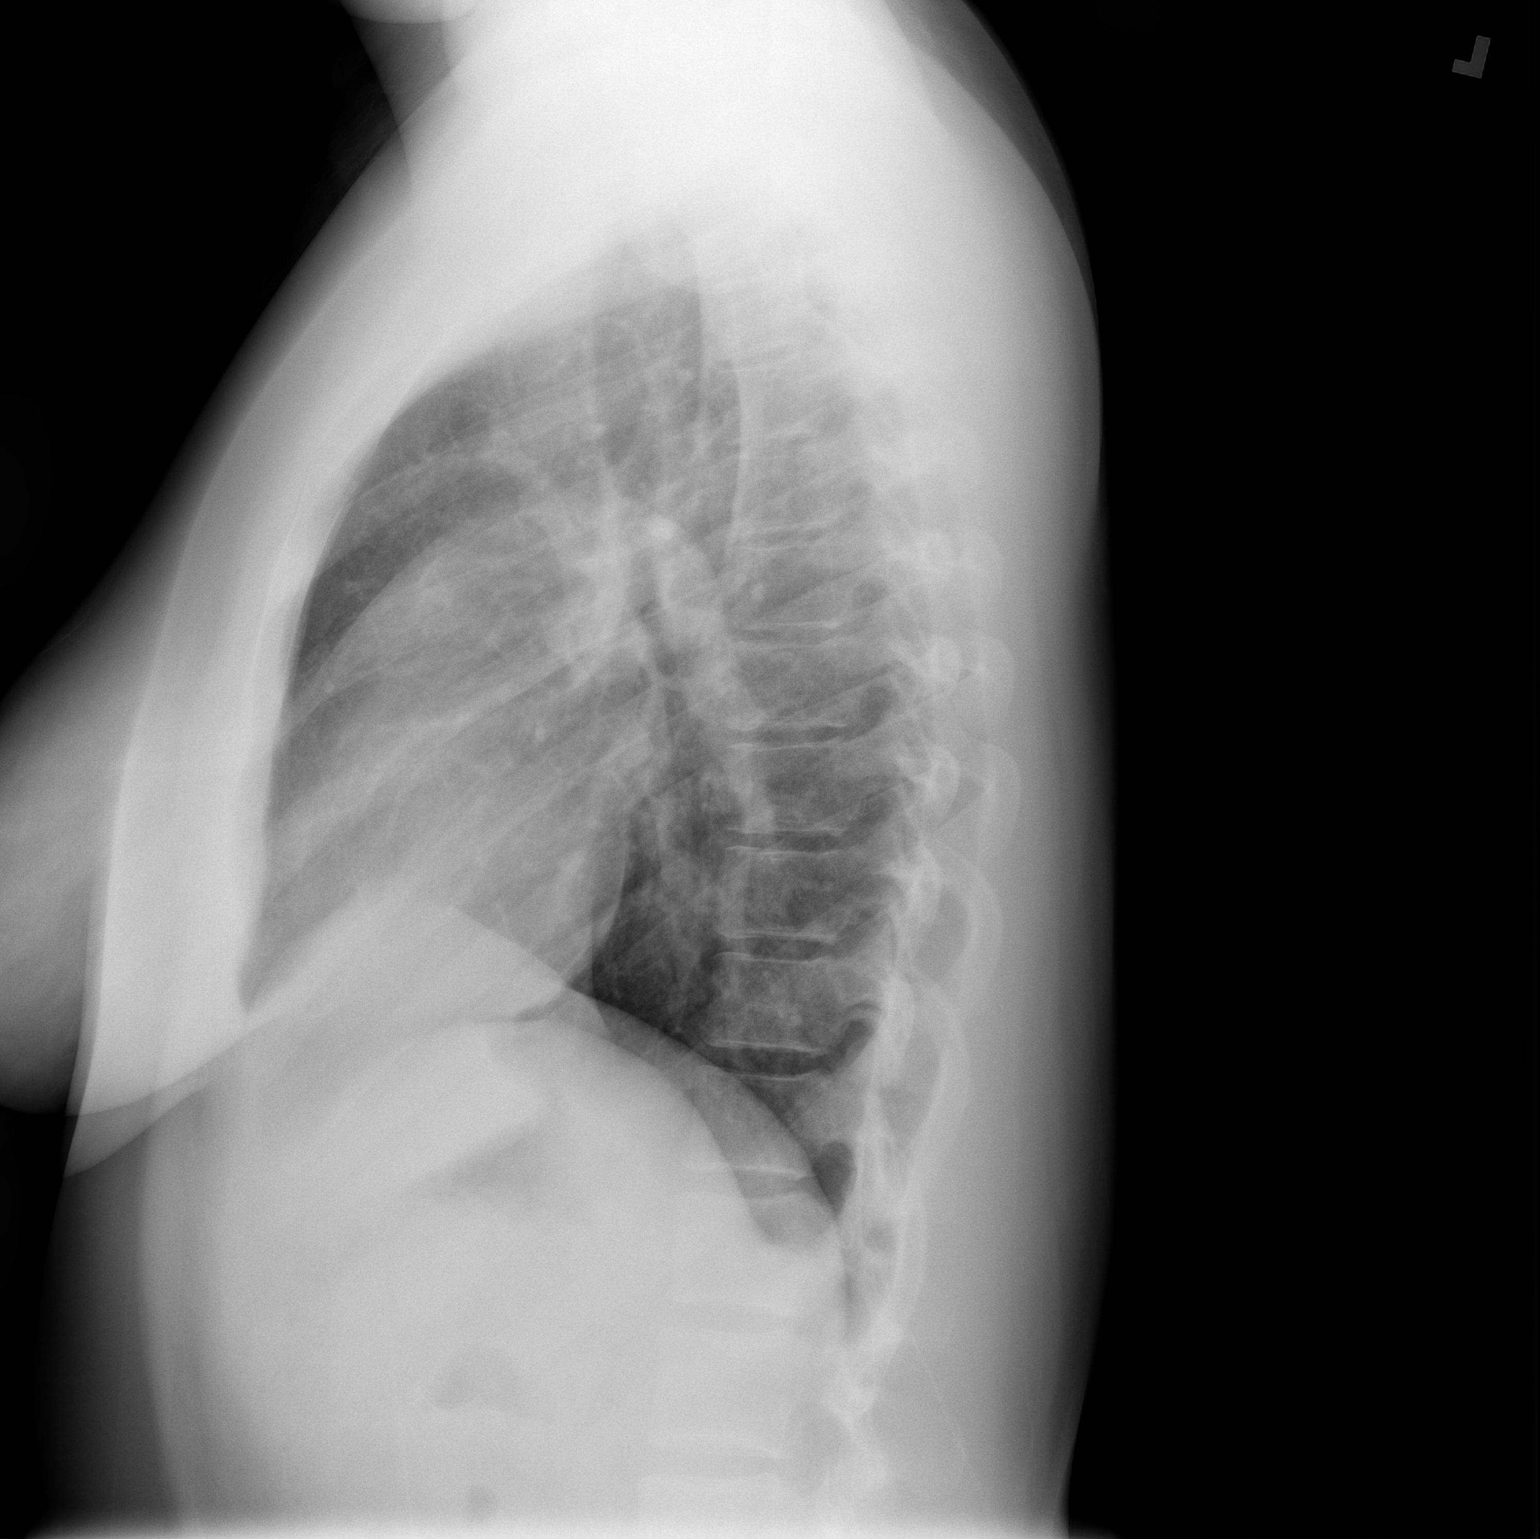

[2 of 2 positions shown; findings below may reference images not displayed]

FINDINGS: The heart size and mediastinal contours are within normal limits.
Both lungs are clear. The visualized skeletal structures are
unremarkable.
IMPRESSION: No active cardiopulmonary disease.

## 2016-11-14 ENCOUNTER — Other Ambulatory Visit: Payer: Self-pay | Admitting: Obstetrics and Gynecology

## 2016-11-15 NOTE — Telephone Encounter (Deleted)
Per pharmacy, patient is due for refill. Please advise, thank you.

## 2016-11-15 NOTE — Telephone Encounter (Addendum)
Medication refill request: XULANE patch Last AEX:  03/01/16 BS Next AEX: 03/09/17  Last MMG (if hormonal medication request): n/a Refill authorized: 03/01/16 #9 w/3 refills;   Per pharmacy, patient is due for refill. Please advise, thank you.

## 2017-03-09 ENCOUNTER — Ambulatory Visit (INDEPENDENT_AMBULATORY_CARE_PROVIDER_SITE_OTHER): Payer: BLUE CROSS/BLUE SHIELD | Admitting: Obstetrics and Gynecology

## 2017-03-09 ENCOUNTER — Encounter: Payer: Self-pay | Admitting: Obstetrics and Gynecology

## 2017-03-09 ENCOUNTER — Other Ambulatory Visit (HOSPITAL_COMMUNITY)
Admission: RE | Admit: 2017-03-09 | Discharge: 2017-03-09 | Disposition: A | Discharge status: Home / Self Care | Payer: BLUE CROSS/BLUE SHIELD | Source: Ambulatory Visit | Attending: Obstetrics and Gynecology | Admitting: Obstetrics and Gynecology

## 2017-03-09 VITALS — BP 120/70 | HR 68 | Resp 16 | Ht 67.75 in | Wt 193.0 lb

## 2017-03-09 DIAGNOSIS — Z01419 Encounter for gynecological examination (general) (routine) without abnormal findings: Secondary | ICD-10-CM | POA: Diagnosis not present

## 2017-03-09 DIAGNOSIS — Z113 Encounter for screening for infections with a predominantly sexual mode of transmission: Secondary | ICD-10-CM | POA: Diagnosis not present

## 2017-03-09 MED ORDER — NORELGESTROMIN-ETH ESTRADIOL 150-35 MCG/24HR TD PTWK: MEDICATED_PATCH | TRANSDERMAL | 3 refills | Status: DC

## 2017-03-09 NOTE — Patient Instructions (Signed)
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
## 2017-03-10 LAB — CYTOLOGY - PAP
CHLAMYDIA, DNA PROBE: NEGATIVE
Diagnosis: NEGATIVE
NEISSERIA GONORRHEA: NEGATIVE
TRICH (WINDOWPATH): NEGATIVE

## 2017-06-28 ENCOUNTER — Other Ambulatory Visit: Payer: Self-pay | Admitting: Internal Medicine

## 2017-06-28 DIAGNOSIS — R1031 Right lower quadrant pain: Secondary | ICD-10-CM

## 2017-06-28 DIAGNOSIS — R221 Localized swelling, mass and lump, neck: Secondary | ICD-10-CM

## 2017-06-28 DIAGNOSIS — J0191 Acute recurrent sinusitis, unspecified: Secondary | ICD-10-CM

## 2017-06-30 ENCOUNTER — Ambulatory Visit
Admission: RE | Admit: 2017-06-30 | Discharge: 2017-06-30 | Disposition: A | Discharge status: Home / Self Care | Payer: BLUE CROSS/BLUE SHIELD | Source: Ambulatory Visit | Attending: Internal Medicine | Admitting: Internal Medicine

## 2017-06-30 DIAGNOSIS — R1031 Right lower quadrant pain: Secondary | ICD-10-CM

## 2017-07-01 ENCOUNTER — Other Ambulatory Visit: Payer: BLUE CROSS/BLUE SHIELD

## 2017-07-04 ENCOUNTER — Other Ambulatory Visit: Payer: BLUE CROSS/BLUE SHIELD

## 2017-09-08 DIAGNOSIS — F4310 Post-traumatic stress disorder, unspecified: Secondary | ICD-10-CM | POA: Diagnosis not present

## 2017-09-08 DIAGNOSIS — F411 Generalized anxiety disorder: Secondary | ICD-10-CM | POA: Diagnosis not present

## 2017-09-08 DIAGNOSIS — F900 Attention-deficit hyperactivity disorder, predominantly inattentive type: Secondary | ICD-10-CM | POA: Diagnosis not present

## 2017-09-08 DIAGNOSIS — F331 Major depressive disorder, recurrent, moderate: Secondary | ICD-10-CM | POA: Diagnosis not present

## 2017-10-06 DIAGNOSIS — F331 Major depressive disorder, recurrent, moderate: Secondary | ICD-10-CM | POA: Diagnosis not present

## 2017-10-06 DIAGNOSIS — F900 Attention-deficit hyperactivity disorder, predominantly inattentive type: Secondary | ICD-10-CM | POA: Diagnosis not present

## 2017-10-06 DIAGNOSIS — F4310 Post-traumatic stress disorder, unspecified: Secondary | ICD-10-CM | POA: Diagnosis not present

## 2017-10-06 DIAGNOSIS — F411 Generalized anxiety disorder: Secondary | ICD-10-CM | POA: Diagnosis not present

## 2017-10-18 DIAGNOSIS — F900 Attention-deficit hyperactivity disorder, predominantly inattentive type: Secondary | ICD-10-CM | POA: Diagnosis not present

## 2017-10-18 DIAGNOSIS — F4311 Post-traumatic stress disorder, acute: Secondary | ICD-10-CM | POA: Diagnosis not present

## 2017-10-18 DIAGNOSIS — F331 Major depressive disorder, recurrent, moderate: Secondary | ICD-10-CM | POA: Diagnosis not present

## 2017-10-18 DIAGNOSIS — F411 Generalized anxiety disorder: Secondary | ICD-10-CM | POA: Diagnosis not present

## 2017-10-18 DIAGNOSIS — F4310 Post-traumatic stress disorder, unspecified: Secondary | ICD-10-CM | POA: Diagnosis not present

## 2017-10-24 DIAGNOSIS — F900 Attention-deficit hyperactivity disorder, predominantly inattentive type: Secondary | ICD-10-CM | POA: Diagnosis not present

## 2017-10-24 DIAGNOSIS — F411 Generalized anxiety disorder: Secondary | ICD-10-CM | POA: Diagnosis not present

## 2017-10-24 DIAGNOSIS — F4310 Post-traumatic stress disorder, unspecified: Secondary | ICD-10-CM | POA: Diagnosis not present

## 2017-10-24 DIAGNOSIS — F331 Major depressive disorder, recurrent, moderate: Secondary | ICD-10-CM | POA: Diagnosis not present

## 2017-10-25 DIAGNOSIS — F4311 Post-traumatic stress disorder, acute: Secondary | ICD-10-CM | POA: Diagnosis not present

## 2017-11-01 DIAGNOSIS — F4311 Post-traumatic stress disorder, acute: Secondary | ICD-10-CM | POA: Diagnosis not present

## 2017-11-08 DIAGNOSIS — F4311 Post-traumatic stress disorder, acute: Secondary | ICD-10-CM | POA: Diagnosis not present

## 2017-11-15 DIAGNOSIS — F4311 Post-traumatic stress disorder, acute: Secondary | ICD-10-CM | POA: Diagnosis not present

## 2017-11-22 DIAGNOSIS — F4311 Post-traumatic stress disorder, acute: Secondary | ICD-10-CM | POA: Diagnosis not present

## 2017-12-19 DIAGNOSIS — R300 Dysuria: Secondary | ICD-10-CM | POA: Diagnosis not present

## 2017-12-19 DIAGNOSIS — N390 Urinary tract infection, site not specified: Secondary | ICD-10-CM | POA: Diagnosis not present

## 2017-12-23 ENCOUNTER — Other Ambulatory Visit: Payer: Self-pay | Admitting: Obstetrics and Gynecology

## 2017-12-23 ENCOUNTER — Telehealth: Payer: Self-pay | Admitting: Obstetrics and Gynecology

## 2017-12-23 MED ORDER — NORELGESTROMIN-ETH ESTRADIOL 150-35 MCG/24HR TD PTWK: MEDICATED_PATCH | TRANSDERMAL | 0 refills | Status: DC

## 2017-12-23 NOTE — Telephone Encounter (Signed)
[facility] states [facility] is filling rx every 2 months. Patient is out of refills. Call to patient. Patient is using patches continuously for 3 months then taking a week off. Rx was written for 1 patch per week then one week off each month. Advised will review with Dr.Silva regarding refill and return call.  Dr.Silva please advise directions for use. Okay to use continuously for 3 months?

## 2017-12-23 NOTE — Telephone Encounter (Signed)
Call returned to patient, left detailed message, ok per dpr. Advised as seen below per Dr. Quincy Simmonds. Rx has been sent to [place]. Return call to office if any additional questions.

## 2017-12-23 NOTE — Telephone Encounter (Signed)
Ok to use Lavon continuously for 3 months at a time and then have menses.  Can refill until due for annual exam is due.

## 2017-12-23 NOTE — Telephone Encounter (Signed)
[facility]iree Carlson with [facility] in [place] called and states refill was denied for Ortho Evra patch. Patient's initial refill in 03/2017 does not equal 365 days. Please advise.   [facility] in [place] phone [phone]

## 2017-12-29 DIAGNOSIS — F4311 Post-traumatic stress disorder, acute: Secondary | ICD-10-CM | POA: Diagnosis not present

## 2018-01-04 DIAGNOSIS — F4310 Post-traumatic stress disorder, unspecified: Secondary | ICD-10-CM | POA: Diagnosis not present

## 2018-01-04 DIAGNOSIS — F4311 Post-traumatic stress disorder, acute: Secondary | ICD-10-CM | POA: Diagnosis not present

## 2018-01-04 DIAGNOSIS — F411 Generalized anxiety disorder: Secondary | ICD-10-CM | POA: Diagnosis not present

## 2018-01-04 DIAGNOSIS — F900 Attention-deficit hyperactivity disorder, predominantly inattentive type: Secondary | ICD-10-CM | POA: Diagnosis not present

## 2018-01-04 DIAGNOSIS — F331 Major depressive disorder, recurrent, moderate: Secondary | ICD-10-CM | POA: Diagnosis not present

## 2018-01-09 DIAGNOSIS — F4311 Post-traumatic stress disorder, acute: Secondary | ICD-10-CM | POA: Diagnosis not present

## 2018-01-18 DIAGNOSIS — F4311 Post-traumatic stress disorder, acute: Secondary | ICD-10-CM | POA: Diagnosis not present

## 2018-01-25 DIAGNOSIS — F4311 Post-traumatic stress disorder, acute: Secondary | ICD-10-CM | POA: Diagnosis not present

## 2018-01-30 DIAGNOSIS — F4311 Post-traumatic stress disorder, acute: Secondary | ICD-10-CM | POA: Diagnosis not present

## 2018-02-08 DIAGNOSIS — F900 Attention-deficit hyperactivity disorder, predominantly inattentive type: Secondary | ICD-10-CM | POA: Diagnosis not present

## 2018-02-08 DIAGNOSIS — F331 Major depressive disorder, recurrent, moderate: Secondary | ICD-10-CM | POA: Diagnosis not present

## 2018-02-08 DIAGNOSIS — F411 Generalized anxiety disorder: Secondary | ICD-10-CM | POA: Diagnosis not present

## 2018-02-08 DIAGNOSIS — F4310 Post-traumatic stress disorder, unspecified: Secondary | ICD-10-CM | POA: Diagnosis not present

## 2018-02-09 DIAGNOSIS — F4311 Post-traumatic stress disorder, acute: Secondary | ICD-10-CM | POA: Diagnosis not present

## 2018-02-15 DIAGNOSIS — H5213 Myopia, bilateral: Secondary | ICD-10-CM | POA: Diagnosis not present

## 2018-02-17 DIAGNOSIS — F4311 Post-traumatic stress disorder, acute: Secondary | ICD-10-CM | POA: Diagnosis not present

## 2018-03-07 DIAGNOSIS — F4311 Post-traumatic stress disorder, acute: Secondary | ICD-10-CM | POA: Diagnosis not present

## 2018-03-14 DIAGNOSIS — F4311 Post-traumatic stress disorder, acute: Secondary | ICD-10-CM | POA: Diagnosis not present

## 2018-03-15 DIAGNOSIS — K130 Diseases of lips: Secondary | ICD-10-CM | POA: Diagnosis not present

## 2018-03-15 DIAGNOSIS — B880 Other acariasis: Secondary | ICD-10-CM | POA: Diagnosis not present

## 2018-03-22 ENCOUNTER — Other Ambulatory Visit (HOSPITAL_COMMUNITY)
Admission: RE | Admit: 2018-03-22 | Discharge: 2018-03-22 | Disposition: A | Discharge status: Home / Self Care | Payer: BLUE CROSS/BLUE SHIELD | Source: Ambulatory Visit | Attending: Obstetrics and Gynecology | Admitting: Obstetrics and Gynecology

## 2018-03-22 ENCOUNTER — Ambulatory Visit: Payer: BLUE CROSS/BLUE SHIELD | Admitting: Obstetrics and Gynecology

## 2018-03-22 ENCOUNTER — Other Ambulatory Visit: Payer: Self-pay

## 2018-03-22 ENCOUNTER — Encounter: Payer: Self-pay | Admitting: Obstetrics and Gynecology

## 2018-03-22 VITALS — BP 122/76 | HR 80 | Resp 16 | Ht 67.75 in | Wt 187.0 lb

## 2018-03-22 DIAGNOSIS — Z113 Encounter for screening for infections with a predominantly sexual mode of transmission: Secondary | ICD-10-CM | POA: Insufficient documentation

## 2018-03-22 DIAGNOSIS — Z01419 Encounter for gynecological examination (general) (routine) without abnormal findings: Secondary | ICD-10-CM | POA: Diagnosis not present

## 2018-03-22 DIAGNOSIS — Z3009 Encounter for other general counseling and advice on contraception: Secondary | ICD-10-CM

## 2018-03-22 DIAGNOSIS — N760 Acute vaginitis: Secondary | ICD-10-CM

## 2018-03-22 DIAGNOSIS — N926 Irregular menstruation, unspecified: Secondary | ICD-10-CM

## 2018-03-22 LAB — POCT URINE PREGNANCY: Preg Test, Ur: NEGATIVE
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
## 2018-03-22 NOTE — Patient Instructions (Signed)
EXERCISE AND DIET:  We recommended that you start or continue a regular exercise program for good health. Regular exercise means any activity that makes your heart beat faster and makes you sweat.  We recommend exercising at least 30 minutes per day at least 3 days a week, preferably 4 or 5.  We also recommend a diet low in fat and sugar.  Inactivity, poor dietary choices and obesity can cause diabetes, heart attack, stroke, and kidney damage, among others.    ALCOHOL AND SMOKING:  Women should limit their alcohol intake to no more than 7 drinks/beers/glasses of wine (combined, not each!) per week. Moderation of alcohol intake to this level decreases your risk of breast cancer and liver damage. And of course, no recreational drugs are part of a healthy lifestyle.  And absolutely no smoking or even second hand smoke. Most people know smoking can cause heart and lung diseases, but did you know it also contributes to weakening of your bones? Aging of your skin?  Yellowing of your teeth and nails?  CALCIUM AND VITAMIN D:  Adequate intake of calcium and Vitamin D are recommended.  The recommendations for exact amounts of these supplements seem to change often, but generally speaking 600 mg of calcium (either carbonate or citrate) and 800 units of Vitamin D per day seems prudent. Certain women may benefit from higher intake of Vitamin D.  If you are among these women, your doctor will have told you during your visit.    PAP SMEARS:  Pap smears, to check for cervical cancer or precancers,  have traditionally been done yearly, although recent scientific advances have shown that most women can have pap smears less often.  However, every woman still should have a physical exam from her gynecologist every year. It will include a breast check, inspection of the vulva and vagina to check for abnormal growths or skin changes, a visual exam of the cervix, and then an exam to evaluate the size and shape of the uterus and  ovaries.  And after 22 years of age, a rectal exam is indicated to check for rectal cancers. We will also provide age appropriate advice regarding health maintenance, like when you should have certain vaccines, screening for sexually transmitted diseases, bone density testing, colonoscopy, mammograms, etc.   MAMMOGRAMS:  All women over 40 years old should have a yearly mammogram. Many facilities now offer a "3D" mammogram, which may cost around $50 extra out of pocket. If possible,  we recommend you accept the option to have the 3D mammogram performed.  It both reduces the number of women who will be called back for extra views which then turn out to be normal, and it is better than the routine mammogram at detecting truly abnormal areas.    COLONOSCOPY:  Colonoscopy to screen for colon cancer is recommended for all women at age 50.  We know, you hate the idea of the prep.  We agree, BUT, having colon cancer and not knowing it is worse!!  Colon cancer so often starts as a polyp that can be seen and removed at colonscopy, which can quite literally save your life!  And if your first colonoscopy is normal and you have no family history of colon cancer, most women don't have to have it again for 10 years.  Once every ten years, you can do something that may end up saving your life, right?  We will be happy to help you get it scheduled when you are ready.    Be sure to check your insurance coverage so you understand how much it will cost.  It may be covered as a preventative service at no cost, but you should check your particular policy.      Intrauterine Device Information An intrauterine device (IUD) is inserted into your uterus to prevent pregnancy. There are two types of IUDs available:  Copper IUD-This type of IUD is wrapped in copper wire and is placed inside the uterus. Copper makes the uterus and fallopian tubes produce a fluid that kills sperm. The copper IUD can stay in place for 10 years.  Hormone  IUD-This type of IUD contains the hormone progestin (synthetic progesterone). The hormone thickens the cervical mucus and prevents sperm from entering the uterus. It also thins the uterine lining to prevent implantation of a fertilized egg. The hormone can weaken or kill the sperm that get into the uterus. One type of hormone IUD can stay in place for 5 years, and another type can stay in place for 3 years.  Your health care provider will make sure you are a good candidate for a contraceptive IUD. Discuss with your health care provider the possible side effects. Advantages of an intrauterine device  IUDs are highly effective, reversible, long acting, and low maintenance.  There are no estrogen-related side effects.  An IUD can be used when breastfeeding.  IUDs are not associated with weight gain.  The copper IUD works immediately after insertion.  The hormone IUD works right away if inserted within 7 days of your period starting. You will need to use a backup method of birth control for 7 days if the hormone IUD is inserted at any other time in your cycle.  The copper IUD does not interfere with your female hormones.  The hormone IUD can make heavy menstrual periods lighter and decrease cramping.  The hormone IUD can be used for 3 or 5 years.  The copper IUD can be used for 10 years. Disadvantages of an intrauterine device  The hormone IUD can be associated with irregular bleeding patterns.  The copper IUD can make your menstrual flow heavier and more painful.  You may experience cramping and vaginal bleeding after insertion. This information is not intended to replace advice given to you by your health care provider. Make sure you discuss any questions you have with your health care provider. Document Released: 06/22/2004 Document Revised: 12/25/2015 Document Reviewed: 01/07/2013 Elsevier Interactive Patient Education  2017 [employer].

## 2018-03-23 LAB — CERVICOVAGINAL ANCILLARY ONLY
Bacterial vaginitis: NEGATIVE
CANDIDA VAGINITIS: NEGATIVE
CHLAMYDIA, DNA PROBE: NEGATIVE
Neisseria Gonorrhea: NEGATIVE
Trichomonas: NEGATIVE

## 2018-03-23 LAB — HEPATITIS C ANTIBODY

## 2018-03-23 LAB — COMPREHENSIVE METABOLIC PANEL
ALBUMIN: 4.2 g/dL (ref 3.5–5.5)
ALK PHOS: 64 IU/L (ref 39–117)
ALT: 28 IU/L (ref 0–32)
AST: 35 IU/L (ref 0–40)
Albumin/Globulin Ratio: 1.5 (ref 1.2–2.2)
BUN / CREAT RATIO: 17 (ref 9–23)
BUN: 12 mg/dL (ref 6–20)
Bilirubin Total: 0.2 mg/dL (ref 0.0–1.2)
CALCIUM: 8.9 mg/dL (ref 8.7–10.2)
CO2: 24 mmol/L (ref 20–29)
CREATININE: 0.7 mg/dL (ref 0.57–1.00)
Chloride: 102 mmol/L (ref 96–106)
GFR, EST AFRICAN AMERICAN: 142 mL/min/{1.73_m2} (ref >59)
GFR, EST NON AFRICAN AMERICAN: 123 mL/min/{1.73_m2} (ref >59)
GLUCOSE: 77 mg/dL (ref 65–99)
Globulin, Total: 2.8 g/dL (ref 1.5–4.5)
Potassium: 4.5 mmol/L (ref 3.5–5.2)
Sodium: 139 mmol/L (ref 134–144)
TOTAL PROTEIN: 7 g/dL (ref 6.0–8.5)

## 2018-03-23 LAB — CBC
HEMOGLOBIN: 12.7 g/dL (ref 11.1–15.9)
Hematocrit: 39.4 % (ref 34.0–46.6)
MCH: 30.2 pg (ref 26.6–33.0)
MCHC: 32.2 g/dL (ref 31.5–35.7)
MCV: 94 fL (ref 79–97)
Platelets: 333 10*3/uL (ref 150–450)
RBC: 4.2 x10E6/uL (ref 3.77–5.28)
RDW: 12.9 % (ref 12.3–15.4)
WBC: 7.7 10*3/uL (ref 3.4–10.8)

## 2018-03-23 LAB — LIPID PANEL
CHOL/HDL RATIO: 3.4 ratio (ref 0.0–4.4)
Cholesterol, Total: 206 mg/dL — ABNORMAL HIGH (ref 100–199)
HDL: 60 mg/dL (ref >39)
LDL CALC: 128 mg/dL — AB (ref 0–99)
Triglycerides: 88 mg/dL (ref 0–149)
VLDL Cholesterol Cal: 18 mg/dL (ref 5–40)

## 2018-03-23 LAB — HEP, RPR, HIV PANEL
HIV SCREEN 4TH GENERATION: NONREACTIVE
Hepatitis B Surface Ag: NEGATIVE
RPR Ser Ql: NONREACTIVE

## 2018-03-28 ENCOUNTER — Telehealth: Payer: Self-pay | Admitting: Obstetrics and Gynecology

## 2018-03-28 DIAGNOSIS — F4311 Post-traumatic stress disorder, acute: Secondary | ICD-10-CM | POA: Diagnosis not present

## 2018-03-28 MED ORDER — MISOPROSTOL 200 MCG PO TABS: ORAL_TABLET | ORAL | 0 refills | Status: DC

## 2018-03-28 NOTE — Telephone Encounter (Signed)
Kyleena IUD appointment scheduled for patient. She is coming from [place]. Offered appointments, would like to plan for when she removes her next ortho evra patch. Placing new patch on today. Procedure appointment scheduled for 04/21/2018 with Dr. Quincy Simmonds. Instructions given. Cytotec sent to Pharmacy in [place] per patient request.   Take Cytotec 200 mcg tablet.  1 tablet PO night before the procedure. 1 tablet PO the morning of the procedure. 800 mg (Can purchase over the counter, you will need four 200 mg pills) 1 hour before appointment.  Take with food.  Be sure to eat and drink before appointment.

## 2018-03-28 NOTE — Telephone Encounter (Signed)
OK for IUD insertion.  I signed the orders for the Cytotec.

## 2018-03-28 NOTE — Telephone Encounter (Signed)
Spoke with patient regarding benefit for a Kyleena IUD insertion. Patient understood and agreeable. Patient "just finished" her cycle, but states she uses a patch for birth control. Call forwarded to Traci Fast, RN to review scheduling time frame and pre-appointment instructions  Forwarding to [employer], RN

## 2018-04-10 DIAGNOSIS — F900 Attention-deficit hyperactivity disorder, predominantly inattentive type: Secondary | ICD-10-CM | POA: Diagnosis not present

## 2018-04-10 DIAGNOSIS — F411 Generalized anxiety disorder: Secondary | ICD-10-CM | POA: Diagnosis not present

## 2018-04-10 DIAGNOSIS — F4310 Post-traumatic stress disorder, unspecified: Secondary | ICD-10-CM | POA: Diagnosis not present

## 2018-04-10 DIAGNOSIS — F331 Major depressive disorder, recurrent, moderate: Secondary | ICD-10-CM | POA: Diagnosis not present

## 2018-04-19 ENCOUNTER — Telehealth: Payer: Self-pay | Admitting: Obstetrics and Gynecology

## 2018-04-19 NOTE — Telephone Encounter (Signed)
Patient cancelled her kyleena procedure for Friday because she can't get off work. Would like a call to reschedule.

## 2018-04-20 NOTE — Telephone Encounter (Signed)
Call placed to reschedule procedure appointment.

## 2018-04-21 ENCOUNTER — Ambulatory Visit: Payer: Self-pay | Admitting: Obstetrics and Gynecology

## 2018-04-29 ENCOUNTER — Other Ambulatory Visit: Payer: Self-pay | Admitting: Obstetrics and Gynecology

## 2018-05-02 ENCOUNTER — Other Ambulatory Visit: Payer: Self-pay | Admitting: Obstetrics and Gynecology

## 2018-05-02 DIAGNOSIS — F4311 Post-traumatic stress disorder, acute: Secondary | ICD-10-CM | POA: Diagnosis not present

## 2018-05-02 NOTE — Telephone Encounter (Signed)
Medication refill request: Xulane patch Last AEX:  03/22/2018 Next AEX: 04/02/2019 Last MMG (if hormonal medication request): n/a Refill authorized: #12 patches with 3 refills.

## 2018-05-08 DIAGNOSIS — J45901 Unspecified asthma with (acute) exacerbation: Secondary | ICD-10-CM | POA: Diagnosis not present

## 2018-05-08 DIAGNOSIS — J069 Acute upper respiratory infection, unspecified: Secondary | ICD-10-CM | POA: Diagnosis not present

## 2018-05-08 DIAGNOSIS — R05 Cough: Secondary | ICD-10-CM | POA: Diagnosis not present

## 2018-05-17 DIAGNOSIS — F4311 Post-traumatic stress disorder, acute: Secondary | ICD-10-CM | POA: Diagnosis not present

## 2018-05-22 DIAGNOSIS — R350 Frequency of micturition: Secondary | ICD-10-CM | POA: Diagnosis not present

## 2018-05-22 DIAGNOSIS — R0789 Other chest pain: Secondary | ICD-10-CM | POA: Diagnosis not present

## 2018-05-22 DIAGNOSIS — R1084 Generalized abdominal pain: Secondary | ICD-10-CM | POA: Diagnosis not present

## 2018-05-22 DIAGNOSIS — R1031 Right lower quadrant pain: Secondary | ICD-10-CM | POA: Diagnosis not present

## 2018-05-22 DIAGNOSIS — R61 Generalized hyperhidrosis: Secondary | ICD-10-CM | POA: Diagnosis not present

## 2018-05-22 DIAGNOSIS — B2790 Infectious mononucleosis, unspecified without complication: Secondary | ICD-10-CM | POA: Diagnosis not present

## 2018-05-22 DIAGNOSIS — J111 Influenza due to unidentified influenza virus with other respiratory manifestations: Secondary | ICD-10-CM | POA: Diagnosis not present

## 2018-05-22 DIAGNOSIS — R112 Nausea with vomiting, unspecified: Secondary | ICD-10-CM | POA: Diagnosis not present

## 2018-05-24 DIAGNOSIS — F4311 Post-traumatic stress disorder, acute: Secondary | ICD-10-CM | POA: Diagnosis not present

## 2018-05-25 DIAGNOSIS — R5381 Other malaise: Secondary | ICD-10-CM | POA: Diagnosis not present

## 2018-05-25 DIAGNOSIS — A770 Spotted fever due to Rickettsia rickettsii: Secondary | ICD-10-CM | POA: Diagnosis not present

## 2018-05-29 DIAGNOSIS — D2261 Melanocytic nevi of right upper limb, including shoulder: Secondary | ICD-10-CM | POA: Diagnosis not present

## 2018-05-29 DIAGNOSIS — D2262 Melanocytic nevi of left upper limb, including shoulder: Secondary | ICD-10-CM | POA: Diagnosis not present

## 2018-05-29 DIAGNOSIS — R21 Rash and other nonspecific skin eruption: Secondary | ICD-10-CM | POA: Diagnosis not present

## 2018-05-29 DIAGNOSIS — D225 Melanocytic nevi of trunk: Secondary | ICD-10-CM | POA: Diagnosis not present

## 2018-05-30 DIAGNOSIS — K625 Hemorrhage of anus and rectum: Secondary | ICD-10-CM | POA: Diagnosis not present

## 2018-05-30 DIAGNOSIS — K582 Mixed irritable bowel syndrome: Secondary | ICD-10-CM | POA: Diagnosis not present

## 2018-05-30 DIAGNOSIS — K602 Anal fissure, unspecified: Secondary | ICD-10-CM | POA: Diagnosis not present

## 2018-05-30 DIAGNOSIS — R194 Change in bowel habit: Secondary | ICD-10-CM | POA: Diagnosis not present

## 2018-06-06 ENCOUNTER — Encounter: Payer: Self-pay | Admitting: Obstetrics and Gynecology

## 2018-06-06 DIAGNOSIS — F331 Major depressive disorder, recurrent, moderate: Secondary | ICD-10-CM | POA: Diagnosis not present

## 2018-06-06 DIAGNOSIS — F4310 Post-traumatic stress disorder, unspecified: Secondary | ICD-10-CM | POA: Diagnosis not present

## 2018-06-06 DIAGNOSIS — F411 Generalized anxiety disorder: Secondary | ICD-10-CM | POA: Diagnosis not present

## 2018-06-06 DIAGNOSIS — F900 Attention-deficit hyperactivity disorder, predominantly inattentive type: Secondary | ICD-10-CM | POA: Diagnosis not present

## 2018-06-07 DIAGNOSIS — F4311 Post-traumatic stress disorder, acute: Secondary | ICD-10-CM | POA: Diagnosis not present

## 2018-06-09 DIAGNOSIS — F4311 Post-traumatic stress disorder, acute: Secondary | ICD-10-CM | POA: Diagnosis not present

## 2018-06-13 ENCOUNTER — Ambulatory Visit (INDEPENDENT_AMBULATORY_CARE_PROVIDER_SITE_OTHER): Payer: BLUE CROSS/BLUE SHIELD | Admitting: Infectious Diseases

## 2018-06-13 ENCOUNTER — Encounter: Payer: Self-pay | Admitting: Infectious Diseases

## 2018-06-13 VITALS — BP 144/103 | HR 76 | Temp 98.3°F | Wt 184.8 lb

## 2018-06-13 DIAGNOSIS — M2550 Pain in unspecified joint: Secondary | ICD-10-CM | POA: Diagnosis not present

## 2018-06-13 NOTE — Addendum Note (Signed)
Addended by: Dolan Amen D on: 06/13/2018 03:22 PM   Modules accepted: Orders

## 2018-06-13 NOTE — Progress Notes (Signed)
   Subjective:    Patient ID: RONIQUA KINTZ, female    DOB: 1996/07/20, 22 y.o.   MRN: [id number]  HPI 22 yo F with hx of illness lasting for several years. Has been intermittent.  Since August this year has seen 4 MDs. Had labs done in October. 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  Has been having inconsistent BM, heartburn, anorexia, abd pain, lower back pain (consistent for several months), chest pain, confused (had epsiodes where she could not find her way home). Exhaustion.  Headaches unrelieved with OTCs (post-ocular). Tingling in fingers and toes. Nausea daily, occas emesis.  Was seen at [facility] and saw her Reginold Agent NP. Was dx with RMSF. Was seen by urgent care in [place] for CP.  Was then seen by GI and was referred here.   Rash- has had bumps, thought they were chiggers.   CMV, EBV.  Had mono her senior year of high school of high school.  Lived in [place] for 3 months 2018, otherwise only lived in [place].  Started lithium last week.  Vyvanse for ADHD. Xanax prn for anxiety, panic.   The past medical history, family history and social history were reviewed/updated in EPIC   Review of Systems  Constitutional: Positive for appetite change, chills and fever. Negative for unexpected weight change.  Cardiovascular: Positive for chest pain.  Gastrointestinal: Positive for abdominal pain, constipation and diarrhea.  Genitourinary: Negative for difficulty urinating.  Musculoskeletal: Positive for arthralgias and back pain.  Neurological: Positive for numbness and headaches.  Hematological: Negative for adenopathy.  Psychiatric/Behavioral: The patient is nervous/anxious.        Objective:   Physical Exam  Constitutional: She is oriented to person, place, and time. She appears well-developed and well-nourished.  HENT:  Head: Normocephalic and atraumatic.  Mouth/Throat: No oropharyngeal exudate.  Eyes: Pupils are equal, round,  and reactive to light. Conjunctivae and EOM are normal. No scleral icterus.  Neck: Normal range of motion. Neck supple.  Cardiovascular: Normal rate, regular rhythm and normal heart sounds.  Pulmonary/Chest: Effort normal and breath sounds normal.  Abdominal: Soft. Bowel sounds are normal. She exhibits no distension. There is no tenderness.  Musculoskeletal: Normal range of motion. She exhibits no edema.       Arms: Lymphadenopathy:    She has no cervical adenopathy.  Neurological: She is alert and oriented to person, place, and time. No cranial nerve deficit or sensory deficit. She exhibits normal muscle tone.  Skin: Skin is warm and dry. No rash noted.  Psychiatric: She has a normal mood and affect.      Assessment & Plan:
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
## 2018-06-13 NOTE — Addendum Note (Signed)
Addended by: Dolan Amen D on: 06/13/2018 03:25 PM   Modules accepted: Orders

## 2018-06-15 DIAGNOSIS — F4311 Post-traumatic stress disorder, acute: Secondary | ICD-10-CM | POA: Diagnosis not present

## 2018-06-16 LAB — COMPREHENSIVE METABOLIC PANEL
AG Ratio: 1.5 (calc) (ref 1.0–2.5)
ALBUMIN MSPROF: 4.2 g/dL (ref 3.6–5.1)
ALT: 12 U/L (ref 6–29)
AST: 14 U/L (ref 10–30)
Alkaline phosphatase (APISO): 57 U/L (ref 33–115)
BUN: 12 mg/dL (ref 7–25)
CHLORIDE: 105 mmol/L (ref 98–110)
CO2: 24 mmol/L (ref 20–32)
Calcium: 9.3 mg/dL (ref 8.6–10.2)
Creat: 0.7 mg/dL (ref 0.50–1.10)
GLOBULIN: 2.8 g/dL (ref 1.9–3.7)
GLUCOSE: 80 mg/dL (ref 65–99)
POTASSIUM: 4.4 mmol/L (ref 3.5–5.3)
Sodium: 138 mmol/L (ref 135–146)
Total Bilirubin: 0.2 mg/dL (ref 0.2–1.2)
Total Protein: 7 g/dL (ref 6.1–8.1)

## 2018-06-16 LAB — CBC WITH DIFFERENTIAL/PLATELET
BASOS PCT: 0.4 %
Basophils Absolute: 55 cells/uL (ref 0–200)
EOS ABS: 164 {cells}/uL (ref 15–500)
EOS PCT: 1.2 %
HCT: 40.3 % (ref 35.0–45.0)
Hemoglobin: 13.5 g/dL (ref 11.7–15.5)
Lymphs Abs: 2466 cells/uL (ref 850–3900)
MCH: 30.2 pg (ref 27.0–33.0)
MCHC: 33.5 g/dL (ref 32.0–36.0)
MCV: 90.2 fL (ref 80.0–100.0)
MONOS PCT: 6.3 %
MPV: 11.4 fL (ref 7.5–12.5)
NEUTROS ABS: 10152 {cells}/uL — AB (ref 1500–7800)
Neutrophils Relative %: 74.1 %
PLATELETS: 372 10*3/uL (ref 140–400)
RBC: 4.47 10*6/uL (ref 3.80–5.10)
RDW: 12 % (ref 11.0–15.0)
TOTAL LYMPHOCYTE: 18 %
WBC mixed population: 863 cells/uL (ref 200–950)
WBC: 13.7 10*3/uL — AB (ref 3.8–10.8)

## 2018-06-16 LAB — CORTISOL: Cortisol, Plasma: 16.6 ug/dL

## 2018-06-16 LAB — EPSTEIN-BARR VIRUS NUCLEAR ANTIGEN ANTIBODY, IGG

## 2018-06-16 LAB — SEDIMENTATION RATE: SED RATE: 14 mm/h (ref 0–20)

## 2018-06-16 LAB — EPSTEIN-BARR VIRUS VCA, IGG: EBV VCA IgG: 98.5 U/mL — ABNORMAL HIGH

## 2018-06-16 LAB — TOXOPLASMA GONDII ANTIBODY, IGM: Toxoplasma Antibody- IgM: <8 AU/mL

## 2018-06-16 LAB — CMV ABS, IGG+IGM (CYTOMEGALOVIRUS)
CMV IgM: <30 AU/mL
CYTOMEGALOVIRUS AB-IGG: 0.84 U/mL — AB

## 2018-06-16 LAB — TOXOPLASMA GONDII ANTIBODY, IGG: Toxoplasma IgG Ratio: <7.2 IU/mL

## 2018-06-16 LAB — ANCA SCREEN W REFLEX TITER: ANCA SCREEN: NEGATIVE

## 2018-06-16 LAB — ANGIOTENSIN CONVERTING ENZYME: Angiotensin-Converting Enzyme: 32 U/L (ref 9–67)

## 2018-06-16 LAB — ANTI-NUCLEAR AB-TITER (ANA TITER): ANA Titer 1: 1:40 {titer} — ABNORMAL HIGH

## 2018-06-16 LAB — EPSTEIN-BARR VIRUS VCA, IGM: EBV VCA IgM: <36 U/mL

## 2018-06-16 LAB — ANA: ANA: POSITIVE — AB

## 2018-06-16 LAB — C-REACTIVE PROTEIN: CRP: 10.6 mg/L — AB (ref <8.0)

## 2018-06-20 ENCOUNTER — Telehealth: Payer: Self-pay | Admitting: Infectious Diseases

## 2018-06-20 DIAGNOSIS — R768 Other specified abnormal immunological findings in serum: Secondary | ICD-10-CM

## 2018-06-20 NOTE — Telephone Encounter (Signed)
Called pt and let her know about her tests.  Leaving town 12-1 to go to treatment facility in [place].  Will try to get her into [facility] prior to that

## 2018-06-21 DIAGNOSIS — F4311 Post-traumatic stress disorder, acute: Secondary | ICD-10-CM | POA: Diagnosis not present

## 2018-06-27 DIAGNOSIS — F4311 Post-traumatic stress disorder, acute: Secondary | ICD-10-CM | POA: Diagnosis not present

## 2018-07-03 ENCOUNTER — Telehealth: Payer: Self-pay | Admitting: *Deleted

## 2018-07-03 NOTE — Telephone Encounter (Signed)
-----   Message from [facility] sent at 07/03/2018 11:34 AM EST ----- Contact: mother- [phone] or patient - [phone] Patient's mother called because is going to a facility for treatment and would like to speak to Dr. Johnnye Sima before this happens to get some input. Patient will be admitted this upcoming Thursday (December 5th)

## 2018-07-03 NOTE — Telephone Encounter (Signed)
Called pt back and spoke with her and her mother about her sx.  Suggested NSAID or tylenol for her muscle and joint aches.  For her GI upset (alternating diarrhea, constipation, has blood in BM).  Will call Gi at [facility] for appt for her while there.
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
## 2018-07-21 ENCOUNTER — Ambulatory Visit: Payer: BLUE CROSS/BLUE SHIELD | Admitting: Rheumatology

## 2019-03-18 ENCOUNTER — Emergency Department
Admission: EM | Admit: 2019-03-18 | Discharge: 2019-03-18 | Disposition: A | Discharge status: Home / Self Care | Payer: BC Managed Care – PPO | Attending: Emergency Medicine | Admitting: Emergency Medicine

## 2019-03-18 DIAGNOSIS — R1111 Vomiting without nausea: Secondary | ICD-10-CM

## 2019-03-18 DIAGNOSIS — F10129 Alcohol abuse with intoxication, unspecified: Secondary | ICD-10-CM | POA: Insufficient documentation

## 2019-03-18 DIAGNOSIS — F1290 Cannabis use, unspecified, uncomplicated: Secondary | ICD-10-CM | POA: Insufficient documentation

## 2019-03-18 DIAGNOSIS — F10929 Alcohol use, unspecified with intoxication, unspecified: Secondary | ICD-10-CM

## 2019-03-18 LAB — ECG 12-LEAD
Atrial Rate: 82 {beats}/min
P Axis: 60 degrees
P-R Interval: 200 ms
Q-T Interval: 404 ms
QRS Duration: 94 ms
QTC Calculation (Bezet): 472 ms
R Axis: 56 degrees
T Axis: 45 degrees
Ventricular Rate: 82 {beats}/min

## 2019-03-18 LAB — ETHANOL: Alcohol: 212 mg/dL — ABNORMAL HIGH

## 2019-03-18 MED ORDER — ONDANSETRON HCL 4 MG/2ML IJ SOLN
4.00 mg | Freq: Once | INTRAMUSCULAR | Status: AC
Start: 2019-03-18 — End: 2019-03-18
  Administered 2019-03-18: 01:00:00 4 mg via INTRAVENOUS
  Filled 2019-03-18: qty 2

## 2019-03-18 MED ORDER — LACTATED RINGERS IV BOLUS
1000.00 mL | Freq: Once | INTRAVENOUS | Status: AC
Start: 2019-03-18 — End: 2019-03-18
  Administered 2019-03-18: 01:00:00 1000 mL via INTRAVENOUS
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
## 2019-03-18 NOTE — Discharge Instructions (Signed)
Alcohol Intoxication    You have been seen for alcohol intoxication. This is another name for being drunk.    Using alcohol as you did today suggests you might have a problem with alcohol abuse.    Alcohol abuse can cause many problems such as liver disease, stomach ulcers and pancreatitis.    Drinking enough alcohol can cause you to stop breathing and die.    Fortunately, you did not suffer any life-threatening complications today.    Do not drive a vehicle if you have been drinking alcohol. You might hurt or kill yourself or someone else if you drive after drinking alcohol.    Return here or go to the nearest Emergency Department immediately if:   You drink enough to lose consciousness or black out.   You become confused, have trouble thinking or are so tired you find it hard to function, even if you haven't been drinking alcohol.    If you can't follow up with your doctor, or if at any time you feel you need to be rechecked or seen again, come back here or go to the nearest emergency department.               Marijuana Abuse    You have been seen for marijuana abuse.    Marijuana is an illegal drug that people use for a variety of reasons. Marijuana can be abused in a number of ways. It can be smoked or eaten. Smoking marijuana causes damage to your lungs. It can increase your risk of lung infections or cancers. Marijuana use can also increase the risk of heart attacks and strokes. Using marijuana may also make it more likely that you will use other drugs. These can be very harmful to your health. People under the influence of marijuana are more likely to get into an accident.    Some symptoms of marijuana use are:   Sleepiness.   Weight gain from an increased appetite.   Paranoia or sense that someone is out to get you.   Fast heart rate.   Dizziness or lightheadedness when you stand up.   Red eyes.   Anxiety or panic attack.    You should stop using marijuana because of the health  risks.    YOU SHOULD SEEK MEDICAL ATTENTION IMMEDIATELY, EITHER HERE OR AT THE NEAREST EMERGENCY DEPARTMENT, IF ANY OF THE FOLLOWING OCCUR:   Your symptoms get worse.   You have any other problems or concerns.             Vomiting    You have been seen for vomiting.    Vomiting (throwing-up) can be caused by many different things. Most of the time the cause IS NOT serious. The doctor feels it is OK for you to go home today.    Common causes of vomiting include the following:   Gastroenteritis (stomach flu), usually with diarrhea.   Other illnesses. Sometimes medical conditions like diabetes, heart problems, headaches, or infections can make someone throw up.    Bowel obstructions (blockages) can cause vomiting and make patients unable to have bowel movements (stool) or pass gas.   Vomiting can be a symptom of appendicitis, especially if there is also pain in the right lower abdomen (belly).    Sometimes it is hard to find out what is causing the vomiting. Vomiting can be treated with anti-nausea medicines like promethazine (Phenergan), prochlorperazine (Compazine) or ondansetron (Zofran).    Try to drink liquids to avoid dehydration. Don't  drink a lot of fluid all at once. Take small sips throughout the day.    YOU SHOULD SEEK MEDICAL ATTENTION IMMEDIATELY, EITHER HERE OR AT THE NEAREST EMERGENCY DEPARTMENT, IF ANY OF THE FOLLOWING OCCURS:   You can't stop vomiting or your vomiting doesn't get better with medication.   You cannot keep liquids down.   You have severe sudden chest or belly pain after vomiting.   You have abdominal pain.

## 2019-04-02 ENCOUNTER — Ambulatory Visit: Payer: BLUE CROSS/BLUE SHIELD | Admitting: Obstetrics and Gynecology

## 2020-01-09 DIAGNOSIS — M797 Fibromyalgia: Secondary | ICD-10-CM | POA: Insufficient documentation

## 2020-01-28 DIAGNOSIS — R1030 Lower abdominal pain, unspecified: Secondary | ICD-10-CM | POA: Insufficient documentation

## 2020-01-28 DIAGNOSIS — R1310 Dysphagia, unspecified: Secondary | ICD-10-CM | POA: Insufficient documentation

## 2020-01-28 DIAGNOSIS — K602 Anal fissure, unspecified: Secondary | ICD-10-CM | POA: Insufficient documentation

## 2020-01-28 DIAGNOSIS — R112 Nausea with vomiting, unspecified: Secondary | ICD-10-CM | POA: Insufficient documentation

## 2020-01-28 DIAGNOSIS — R197 Diarrhea, unspecified: Secondary | ICD-10-CM | POA: Insufficient documentation

## 2020-01-28 DIAGNOSIS — K921 Melena: Secondary | ICD-10-CM | POA: Insufficient documentation

## 2020-03-24 DIAGNOSIS — K3189 Other diseases of stomach and duodenum: Secondary | ICD-10-CM | POA: Insufficient documentation

## 2020-04-14 DIAGNOSIS — Z91410 Personal history of adult physical and sexual abuse: Secondary | ICD-10-CM | POA: Insufficient documentation

## 2020-06-25 DIAGNOSIS — R0981 Nasal congestion: Secondary | ICD-10-CM | POA: Diagnosis not present

## 2020-06-25 DIAGNOSIS — J209 Acute bronchitis, unspecified: Secondary | ICD-10-CM | POA: Diagnosis not present

## 2020-11-25 DIAGNOSIS — L738 Other specified follicular disorders: Secondary | ICD-10-CM | POA: Diagnosis not present

## 2020-11-25 DIAGNOSIS — L720 Epidermal cyst: Secondary | ICD-10-CM | POA: Diagnosis not present

## 2021-03-02 DIAGNOSIS — C801 Malignant (primary) neoplasm, unspecified: Secondary | ICD-10-CM

## 2021-03-02 HISTORY — DX: Malignant (primary) neoplasm, unspecified: C80.1

## 2021-03-03 DIAGNOSIS — F332 Major depressive disorder, recurrent severe without psychotic features: Secondary | ICD-10-CM | POA: Diagnosis not present

## 2021-03-10 DIAGNOSIS — Z79899 Other long term (current) drug therapy: Secondary | ICD-10-CM | POA: Diagnosis not present

## 2021-03-10 DIAGNOSIS — R946 Abnormal results of thyroid function studies: Secondary | ICD-10-CM | POA: Diagnosis not present

## 2021-03-10 DIAGNOSIS — Z0000 Encounter for general adult medical examination without abnormal findings: Secondary | ICD-10-CM | POA: Diagnosis not present
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
## 2021-03-13 ENCOUNTER — Inpatient Hospital Stay: Payer: BC Managed Care – PPO | Attending: Physician Assistant | Admitting: Physician Assistant

## 2021-03-13 ENCOUNTER — Inpatient Hospital Stay: Payer: BC Managed Care – PPO

## 2021-03-13 ENCOUNTER — Other Ambulatory Visit: Payer: Self-pay

## 2021-03-13 ENCOUNTER — Encounter: Payer: Self-pay | Admitting: Physician Assistant

## 2021-03-13 VITALS — BP 117/67 | HR 66 | Temp 98.9°F | Resp 16 | Ht 67.75 in | Wt 234.5 lb

## 2021-03-13 DIAGNOSIS — R110 Nausea: Secondary | ICD-10-CM | POA: Diagnosis not present

## 2021-03-13 DIAGNOSIS — R1030 Lower abdominal pain, unspecified: Secondary | ICD-10-CM | POA: Insufficient documentation

## 2021-03-13 DIAGNOSIS — Z79899 Other long term (current) drug therapy: Secondary | ICD-10-CM | POA: Insufficient documentation

## 2021-03-13 DIAGNOSIS — J45909 Unspecified asthma, uncomplicated: Secondary | ICD-10-CM | POA: Diagnosis not present

## 2021-03-13 DIAGNOSIS — Z8619 Personal history of other infectious and parasitic diseases: Secondary | ICD-10-CM | POA: Diagnosis not present

## 2021-03-13 DIAGNOSIS — D72829 Elevated white blood cell count, unspecified: Secondary | ICD-10-CM | POA: Diagnosis not present

## 2021-03-13 DIAGNOSIS — R5383 Other fatigue: Secondary | ICD-10-CM | POA: Diagnosis not present

## 2021-03-13 DIAGNOSIS — Z808 Family history of malignant neoplasm of other organs or systems: Secondary | ICD-10-CM | POA: Diagnosis not present

## 2021-03-13 DIAGNOSIS — R109 Unspecified abdominal pain: Secondary | ICD-10-CM | POA: Insufficient documentation

## 2021-03-13 DIAGNOSIS — C9210 Chronic myeloid leukemia, BCR/ABL-positive, not having achieved remission: Secondary | ICD-10-CM | POA: Diagnosis not present

## 2021-03-16 ENCOUNTER — Other Ambulatory Visit (HOSPITAL_COMMUNITY): Payer: Self-pay | Admitting: Internal Medicine

## 2021-03-16 ENCOUNTER — Other Ambulatory Visit: Payer: Self-pay

## 2021-03-16 ENCOUNTER — Inpatient Hospital Stay: Payer: BC Managed Care – PPO

## 2021-03-16 DIAGNOSIS — Z808 Family history of malignant neoplasm of other organs or systems: Secondary | ICD-10-CM | POA: Diagnosis not present

## 2021-03-16 DIAGNOSIS — D72829 Elevated white blood cell count, unspecified: Secondary | ICD-10-CM | POA: Insufficient documentation

## 2021-03-16 DIAGNOSIS — R109 Unspecified abdominal pain: Secondary | ICD-10-CM | POA: Diagnosis not present

## 2021-03-16 DIAGNOSIS — R110 Nausea: Secondary | ICD-10-CM | POA: Diagnosis not present

## 2021-03-16 DIAGNOSIS — Z0000 Encounter for general adult medical examination without abnormal findings: Secondary | ICD-10-CM | POA: Diagnosis not present

## 2021-03-16 DIAGNOSIS — C9210 Chronic myeloid leukemia, BCR/ABL-positive, not having achieved remission: Secondary | ICD-10-CM | POA: Diagnosis not present

## 2021-03-16 DIAGNOSIS — R1030 Lower abdominal pain, unspecified: Secondary | ICD-10-CM | POA: Diagnosis not present

## 2021-03-16 DIAGNOSIS — Z79899 Other long term (current) drug therapy: Secondary | ICD-10-CM | POA: Diagnosis not present

## 2021-03-16 DIAGNOSIS — J45909 Unspecified asthma, uncomplicated: Secondary | ICD-10-CM | POA: Diagnosis not present

## 2021-03-16 DIAGNOSIS — R5383 Other fatigue: Secondary | ICD-10-CM | POA: Diagnosis not present

## 2021-03-16 DIAGNOSIS — K654 Sclerosing mesenteritis: Secondary | ICD-10-CM

## 2021-03-16 DIAGNOSIS — Z8619 Personal history of other infectious and parasitic diseases: Secondary | ICD-10-CM | POA: Diagnosis not present

## 2021-03-16 DIAGNOSIS — Z1339 Encounter for screening examination for other mental health and behavioral disorders: Secondary | ICD-10-CM | POA: Diagnosis not present

## 2021-03-16 LAB — CMP (CANCER CENTER ONLY)
ALT: 32 U/L (ref 0–44)
AST: 24 U/L (ref 15–41)
Albumin: 3.7 g/dL (ref 3.5–5.0)
Alkaline Phosphatase: 87 U/L (ref 38–126)
Anion gap: 12 (ref 5–15)
BUN: 10 mg/dL (ref 6–20)
CO2: 25 mmol/L (ref 22–32)
Calcium: 9.2 mg/dL (ref 8.9–10.3)
Chloride: 103 mmol/L (ref 98–111)
Creatinine: 0.88 mg/dL (ref 0.44–1.00)
GFR, Estimated: >60 mL/min (ref >60)
Glucose, Bld: 98 mg/dL (ref 70–99)
Potassium: 4.3 mmol/L (ref 3.5–5.1)
Sodium: 140 mmol/L (ref 135–145)
Total Bilirubin: 0.3 mg/dL (ref 0.3–1.2)
Total Protein: 7.4 g/dL (ref 6.5–8.1)

## 2021-03-16 LAB — CBC WITH DIFFERENTIAL (CANCER CENTER ONLY)
Abs Immature Granulocytes: 3.42 10*3/uL — ABNORMAL HIGH (ref 0.00–0.07)
Basophils Absolute: 1.1 10*3/uL — ABNORMAL HIGH (ref 0.0–0.1)
Basophils Relative: 3 %
Eosinophils Absolute: 1.1 10*3/uL — ABNORMAL HIGH (ref 0.0–0.5)
Eosinophils Relative: 3 %
HCT: 42.5 % (ref 36.0–46.0)
Hemoglobin: 13.9 g/dL (ref 12.0–15.0)
Immature Granulocytes: 10 %
Lymphocytes Relative: 13 %
Lymphs Abs: 4.5 10*3/uL — ABNORMAL HIGH (ref 0.7–4.0)
MCH: 27.4 pg (ref 26.0–34.0)
MCHC: 32.7 g/dL (ref 30.0–36.0)
MCV: 83.8 fL (ref 80.0–100.0)
Monocytes Absolute: 1.6 10*3/uL — ABNORMAL HIGH (ref 0.1–1.0)
Monocytes Relative: 4 %
Neutro Abs: 24.1 10*3/uL — ABNORMAL HIGH (ref 1.7–7.7)
Neutrophils Relative %: 67 %
Platelet Count: 361 10*3/uL (ref 150–400)
RBC: 5.07 MIL/uL (ref 3.87–5.11)
RDW: 13.8 % (ref 11.5–15.5)
WBC Count: 35.7 10*3/uL — ABNORMAL HIGH (ref 4.0–10.5)
nRBC: 0 % (ref 0.0–0.2)

## 2021-03-16 LAB — SAVE SMEAR(SSMR), FOR PROVIDER SLIDE REVIEW

## 2021-03-16 LAB — SEDIMENTATION RATE: Sed Rate: 15 mm/hr (ref 0–22)

## 2021-03-16 LAB — C-REACTIVE PROTEIN: CRP: 1.6 mg/dL — ABNORMAL HIGH (ref <1.0)

## 2021-03-17 ENCOUNTER — Ambulatory Visit (HOSPITAL_COMMUNITY)
Admission: RE | Admit: 2021-03-17 | Discharge: 2021-03-17 | Disposition: A | Discharge status: Home / Self Care | Payer: BC Managed Care – PPO | Source: Ambulatory Visit | Attending: Internal Medicine | Admitting: Internal Medicine

## 2021-03-17 DIAGNOSIS — K654 Sclerosing mesenteritis: Secondary | ICD-10-CM | POA: Diagnosis not present

## 2021-03-17 DIAGNOSIS — R109 Unspecified abdominal pain: Secondary | ICD-10-CM | POA: Diagnosis not present

## 2021-03-17 MED ORDER — IOHEXOL 350 MG/ML SOLN
80.0000 mL | Freq: Once | INTRAVENOUS | Status: AC | PRN
  Administered 2021-03-17: 80 mL via INTRAVENOUS

## 2021-03-18 ENCOUNTER — Telehealth: Payer: Self-pay | Admitting: *Deleted

## 2021-03-18 NOTE — Telephone Encounter (Addendum)
[name] patient regarding CT scan results. Spoke with pt and advised that her scan was normal. No unusual findings.  Advised that Dede Query, PA is waiting for the rest of her specialized lab results to come back before deciding next steps. Advised that those labs can take 10 days or more.m Advised that Murray Hodgkins will call when she has those results. Pt voiced understanding

## 2021-03-18 NOTE — Telephone Encounter (Signed)
Received vm message from Dr. Hoover Brunette office. Pt has had CT scan of ABD/pelvis. No unusual findings. He states pt is ok for Bone Marrow biopsy   Please advise

## 2021-03-24 ENCOUNTER — Telehealth: Payer: Self-pay | Admitting: Physician Assistant

## 2021-03-24 LAB — JAK2 (INCLUDING V617F AND EXON 12), MPL,& CALR W/RFL MPN PANEL (NGS)

## 2021-03-24 LAB — BCR ABL1 FISH (GENPATH)

## 2021-03-24 NOTE — Telephone Encounter (Signed)
Scheduled per provider request. Called and spoke with patient. Confirmed appt

## 2021-03-25 ENCOUNTER — Inpatient Hospital Stay (HOSPITAL_BASED_OUTPATIENT_CLINIC_OR_DEPARTMENT_OTHER): Payer: BC Managed Care – PPO | Admitting: Hematology and Oncology

## 2021-03-25 ENCOUNTER — Other Ambulatory Visit: Payer: Self-pay

## 2021-03-25 VITALS — BP 126/70 | HR 68 | Temp 98.6°F | Resp 17 | Wt 237.3 lb

## 2021-03-25 DIAGNOSIS — R1030 Lower abdominal pain, unspecified: Secondary | ICD-10-CM | POA: Diagnosis not present

## 2021-03-25 DIAGNOSIS — R109 Unspecified abdominal pain: Secondary | ICD-10-CM | POA: Diagnosis not present

## 2021-03-25 DIAGNOSIS — C9210 Chronic myeloid leukemia, BCR/ABL-positive, not having achieved remission: Secondary | ICD-10-CM | POA: Diagnosis not present

## 2021-03-25 DIAGNOSIS — J45909 Unspecified asthma, uncomplicated: Secondary | ICD-10-CM | POA: Diagnosis not present

## 2021-03-25 DIAGNOSIS — R110 Nausea: Secondary | ICD-10-CM | POA: Diagnosis not present

## 2021-03-25 DIAGNOSIS — R5383 Other fatigue: Secondary | ICD-10-CM | POA: Diagnosis not present

## 2021-03-25 DIAGNOSIS — Z79899 Other long term (current) drug therapy: Secondary | ICD-10-CM | POA: Diagnosis not present

## 2021-03-25 DIAGNOSIS — Z8619 Personal history of other infectious and parasitic diseases: Secondary | ICD-10-CM | POA: Diagnosis not present

## 2021-03-25 DIAGNOSIS — Z808 Family history of malignant neoplasm of other organs or systems: Secondary | ICD-10-CM | POA: Diagnosis not present

## 2021-03-30 ENCOUNTER — Telehealth: Payer: Self-pay | Admitting: Pharmacist

## 2021-03-30 ENCOUNTER — Telehealth: Payer: Self-pay | Admitting: *Deleted

## 2021-03-30 ENCOUNTER — Telehealth: Payer: Self-pay

## 2021-03-30 ENCOUNTER — Other Ambulatory Visit (HOSPITAL_COMMUNITY): Payer: Self-pay

## 2021-03-30 DIAGNOSIS — C9210 Chronic myeloid leukemia, BCR/ABL-positive, not having achieved remission: Secondary | ICD-10-CM

## 2021-03-30 MED ORDER — DASATINIB 100 MG PO TABS
100.0000 mg | ORAL_TABLET | Freq: Every day | ORAL | 3 refills | Status: DC
  Filled 2021-03-30: qty 30, 30d supply, fill #0

## 2021-03-30 MED ORDER — DASATINIB 100 MG PO TABS: 100.0000 mg | ORAL_TABLET | Freq: Every day | ORAL | 3 refills | Status: DC

## 2021-03-30 NOTE — Telephone Encounter (Signed)
Oral Oncology Patient Advocate Encounter   Received notification from [place] that prior authorization for [facility] is required.   PA submitted on CoverMyMeds Key [id number] Status is pending   Oral Oncology Clinic will continue to follow.  [facility] [place] Phone [phone] Fax [phone] 03/30/2021 10:00 AM

## 2021-03-30 NOTE — Telephone Encounter (Signed)
Her care for reproductive planning [facility] happen at the [facility].  She will need to ask her medical oncologist for a referral to that university's reproductive endocrinology team to start this discussion.   Please let me know if I can do anything further!

## 2021-03-30 NOTE — Telephone Encounter (Signed)
Received call from pt's mother with 2 concerns.   #1 pt is c/o pain 'all over'  Asked her to be more specific. Pt  9on speaker phone) states she has pain in her abdomen, head, joints, hurts to take a deep breathe. This has started snce her appt with Dr. Lorenso Courier last week. #2 Pt's mother was asking about fertility preservation as her daughter is to start Dasatinib soon. She is asking for a contact with fertility clinic to potentially harvest pt's eggs.  Discussed the above with Dr. Lorenso Courier.  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 Pt has a h/o anxiety and he feels likely her c/o are somatic manifestations of her anxiety with new diagnoses. As far as fertility-Dasatinib does not affect future reproduction abilities but it does cause fetal harm. Pt should not get pregnant for that reason. So while pt is on this medication should use birth control consistently. She is likely to be on this for at least 3 years. At that time, it will be determined if she would need to continue indefinitely.The bottom line if that pt should not get pregnant while on Dasatinib. If she should get to a point where should does not need to take the medication, she should be able to get pregnant as the medication doers not affect her reproductive abilities.  Advised that she could explore surrogate pregnancy with her eggs if she needs to stay on Dasatinib. If that is the case then harvesting eggs may be what she needs.  Explained all of the above to the patient and her mother. Provide # to [facility] reproduction clinic. Advised that we will send a referral.

## 2021-03-30 NOTE — Telephone Encounter (Signed)
Mom informed of Dr. Elza Rafter recommendation. She said that they are not yet at a university they are still at [facility]. Trying to get in at [facility].  I did explain this is not a procedure that Dr. Quincy Simmonds performs and that she would need referral, possibly locally to Dr. Kerin Perna, but I think that her oncologist would have experience with this and be able to help with the referral. She will contact oncologist.

## 2021-03-30 NOTE — Telephone Encounter (Signed)
Oral Oncology Patient Advocate Encounter  Prior Authorization for Sprycel has been approved.    PA# [id number] Effective dates: 03/30/2021 through 10/28/2021  Patient must fill at [facility] will continue to follow.   [place] [facility] [place] Phone [phone] Fax [phone] 03/30/2021 1:55 PM

## 2021-03-30 NOTE — Telephone Encounter (Addendum)
Oral Oncology Pharmacist Encounter  Received new prescription for Sprycel (dasatinib) for the treatment of newly diagnosed chronic phase CML, planned duration until disease progression or unacceptable drug toxicity.  Prescription dose and frequency assessed for appropriateness. CBC w/ Diff and CMP from 03/16/21 assessed, noted pt with leukocytosis (WBC 35.7 K/uL), CMP unremarkable - no baseline dose adjustments required.  Current medication list in Epic reviewed, DDIs with Sprycel identified: Concomitant use of Sprycel and pantoprazole is contraindicated due to PPIs decreased serum concentration of Sprycel. If patient is still utilizing pantoprazole, this will need to be discontinued. If patient needs to utilize acid-reducing therapy while on Sprycel, will need to switch to agents such as Tums and separate at least 2 hours prior to Sprycel. Category C DDI between Sprycel and Pristiq as well as Sprycel and Viibryd due to risk possible increase bleed risk. Will counsel patient to monitor for s/sx of bleeding/bruising. No change in therapy warranted at this time.  Evaluated chart and no patient barriers to medication adherence noted.   Patient's insurance requires that Sprycel be filled through [facility]. Prescription redirected for dispensing.  Oral Oncology Clinic will continue to follow for insurance authorization, copayment issues, initial counseling and start date.  Leron Croak, PharmD, BCPS Hematology/Oncology Clinical Pharmacist [facility] [phone] 03/30/2021 10:13 AM

## 2021-03-30 NOTE — Telephone Encounter (Signed)
Mom Called. Patient has been diagnosed with Myl------? Leukemia and they would like information on how to proceed to "harvest eggs prior to her treatments".

## 2021-03-30 NOTE — Telephone Encounter (Signed)
Pt and her mother voiced understanding.

## 2021-03-31 ENCOUNTER — Telehealth: Payer: Self-pay | Admitting: Hematology and Oncology

## 2021-03-31 DIAGNOSIS — Z3169 Encounter for other general counseling and advice on procreation: Secondary | ICD-10-CM | POA: Diagnosis not present

## 2021-03-31 NOTE — Telephone Encounter (Signed)
Scheduled appt per 8/30 sch msg. Pt aware.
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
## 2021-04-02 ENCOUNTER — Other Ambulatory Visit: Payer: Self-pay | Admitting: Radiology

## 2021-04-03 ENCOUNTER — Ambulatory Visit (HOSPITAL_COMMUNITY)
Admission: RE | Admit: 2021-04-03 | Discharge: 2021-04-03 | Disposition: A | Discharge status: Home / Self Care | Payer: BC Managed Care – PPO | Source: Ambulatory Visit | Attending: Hematology and Oncology | Admitting: Hematology and Oncology

## 2021-04-03 ENCOUNTER — Telehealth: Payer: Self-pay | Admitting: Radiology

## 2021-04-03 ENCOUNTER — Encounter (HOSPITAL_COMMUNITY): Payer: Self-pay

## 2021-04-03 ENCOUNTER — Other Ambulatory Visit: Payer: Self-pay

## 2021-04-03 DIAGNOSIS — Z7951 Long term (current) use of inhaled steroids: Secondary | ICD-10-CM | POA: Insufficient documentation

## 2021-04-03 DIAGNOSIS — C9210 Chronic myeloid leukemia, BCR/ABL-positive, not having achieved remission: Secondary | ICD-10-CM | POA: Diagnosis not present

## 2021-04-03 DIAGNOSIS — Z79899 Other long term (current) drug therapy: Secondary | ICD-10-CM | POA: Diagnosis not present

## 2021-04-03 DIAGNOSIS — J45909 Unspecified asthma, uncomplicated: Secondary | ICD-10-CM | POA: Diagnosis not present

## 2021-04-03 LAB — CBC WITH DIFFERENTIAL/PLATELET
Abs Immature Granulocytes: 3 10*3/uL — ABNORMAL HIGH (ref 0.00–0.07)
Band Neutrophils: 3 %
Basophils Absolute: 1.3 10*3/uL — ABNORMAL HIGH (ref 0.0–0.1)
Basophils Relative: 3 %
Eosinophils Absolute: 0.4 10*3/uL (ref 0.0–0.5)
Eosinophils Relative: 1 %
HCT: 37.4 % (ref 36.0–46.0)
Hemoglobin: 11.9 g/dL — ABNORMAL LOW (ref 12.0–15.0)
Lymphocytes Relative: 5 %
Lymphs Abs: 2.2 10*3/uL (ref 0.7–4.0)
MCH: 27.9 pg (ref 26.0–34.0)
MCHC: 31.8 g/dL (ref 30.0–36.0)
MCV: 87.6 fL (ref 80.0–100.0)
Metamyelocytes Relative: 1 %
Monocytes Absolute: 0.4 10*3/uL (ref 0.1–1.0)
Monocytes Relative: 1 %
Myelocytes: 6 %
Neutro Abs: 35.7 10*3/uL — ABNORMAL HIGH (ref 1.7–7.7)
Neutrophils Relative %: 80 %
Platelets: 306 10*3/uL (ref 150–400)
RBC: 4.27 MIL/uL (ref 3.87–5.11)
RDW: 15 % (ref 11.5–15.5)
WBC: 43 10*3/uL — ABNORMAL HIGH (ref 4.0–10.5)
nRBC: 0 % (ref 0.0–0.2)

## 2021-04-03 MED ORDER — DIPHENHYDRAMINE HCL 50 MG/ML IJ SOLN
INTRAMUSCULAR | Status: AC
  Filled 2021-04-03: qty 1

## 2021-04-03 MED ORDER — FENTANYL CITRATE (PF) 100 MCG/2ML IJ SOLN
INTRAMUSCULAR | Status: AC
  Filled 2021-04-03: qty 2

## 2021-04-03 MED ORDER — FENTANYL CITRATE (PF) 100 MCG/2ML IJ SOLN
INTRAMUSCULAR | Status: AC | PRN
  Administered 2021-04-03 (×2): 50 ug via INTRAVENOUS

## 2021-04-03 MED ORDER — SODIUM CHLORIDE 0.9 % IV SOLN: INTRAVENOUS | Status: DC

## 2021-04-03 MED ORDER — DIPHENHYDRAMINE HCL 50 MG/ML IJ SOLN
INTRAMUSCULAR | Status: AC | PRN
Start: 2021-04-03 — End: 2021-04-03
  Administered 2021-04-03: 25 mg via INTRAVENOUS

## 2021-04-03 MED ORDER — LIDOCAINE HCL (PF) 1 % IJ SOLN
INTRAMUSCULAR | Status: AC | PRN
  Administered 2021-04-03: 20 mL

## 2021-04-03 MED ORDER — MIDAZOLAM HCL 2 MG/2ML IJ SOLN
INTRAMUSCULAR | Status: AC
  Filled 2021-04-03: qty 6

## 2021-04-03 MED ORDER — MIDAZOLAM HCL 2 MG/2ML IJ SOLN
INTRAMUSCULAR | Status: AC | PRN
  Administered 2021-04-03 (×4): 1 mg via INTRAVENOUS

## 2021-04-03 NOTE — Discharge Instructions (Signed)
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
## 2021-04-03 NOTE — Procedures (Signed)
Interventional Radiology Procedure Note  Procedure: CT guided bone marrow aspiration and biopsy  Complications: None  EBL: < 10 mL  Findings: Aspirate and core biopsy performed of bone marrow in left iliac bone.  Plan: Bedrest supine x 1 hrs  Eva Griffo T. Davonna Ertl, M.D Pager:  [phone]    

## 2021-04-03 NOTE — Progress Notes (Signed)
Pt mother called about pt c/o pain. S/p CT guided bone marrow bx early today. No complications noted during procedure and no out of the ordinary post procedure pain. States has been taking Tylenol and using ice packs without much relief. States site looks ok, no swelling, bruising or bleeding.  Recommended begin Ibuprofen 600-'800mg'$  every 6-8 hrs in addition to Tylenol. Can alternate ice/heat for comfort. Expect discomfort to continue to improve, can contact call service if not improved by tomorrow.  Ascencion Dike PA-C Interventional Radiology 04/03/2021 3:05 PM

## 2021-04-08 ENCOUNTER — Other Ambulatory Visit: Payer: Self-pay | Admitting: *Deleted

## 2021-04-08 ENCOUNTER — Telehealth: Payer: Self-pay | Admitting: *Deleted

## 2021-04-08 DIAGNOSIS — C9210 Chronic myeloid leukemia, BCR/ABL-positive, not having achieved remission: Secondary | ICD-10-CM

## 2021-04-08 NOTE — Telephone Encounter (Signed)
Received call from pt's mother with concerns about her daughter. Pt is c/o ongoing pain at site of bone marrow biopsy from 04/03/21 and continued generalized body aches and pains.  Advised that pt can take tylenol /ibuprofen for the pain and also aspply ice packs to the biopsy site. \Advised it will gradually get better. Reaffirmed that CML is not causing her current symptoms, that all her other labs look good. Pt's mother understands that her daughter's high anxiety levels are not helping her, pt is staying in bed all the time, binging on high sugar foods etc. Advised that I could send in a SW referral to assist with pt's coping skills. Pt currently in therapy but therapist is unfamiliar with CML. Advised that Dr. Lorenso Courier and I will talk more to her about the results of her biopsy at her Friday appt. And answer questions that she has.  Pt's mom voiced understanding and appreciation for lengthy call and advise.

## 2021-04-09 DIAGNOSIS — R7401 Elevation of levels of liver transaminase levels: Secondary | ICD-10-CM | POA: Diagnosis not present

## 2021-04-09 DIAGNOSIS — Z6836 Body mass index (BMI) 36.0-36.9, adult: Secondary | ICD-10-CM | POA: Diagnosis not present

## 2021-04-09 DIAGNOSIS — C9210 Chronic myeloid leukemia, BCR/ABL-positive, not having achieved remission: Secondary | ICD-10-CM | POA: Diagnosis not present

## 2021-04-09 DIAGNOSIS — F419 Anxiety disorder, unspecified: Secondary | ICD-10-CM | POA: Diagnosis not present

## 2021-04-09 DIAGNOSIS — C801 Malignant (primary) neoplasm, unspecified: Secondary | ICD-10-CM | POA: Diagnosis not present

## 2021-04-09 DIAGNOSIS — J45909 Unspecified asthma, uncomplicated: Secondary | ICD-10-CM | POA: Diagnosis not present

## 2021-04-09 DIAGNOSIS — R5382 Chronic fatigue, unspecified: Secondary | ICD-10-CM | POA: Diagnosis not present

## 2021-04-09 DIAGNOSIS — Z79899 Other long term (current) drug therapy: Secondary | ICD-10-CM | POA: Diagnosis not present

## 2021-04-09 DIAGNOSIS — Z7951 Long term (current) use of inhaled steroids: Secondary | ICD-10-CM | POA: Diagnosis not present

## 2021-04-10 ENCOUNTER — Other Ambulatory Visit: Payer: Self-pay

## 2021-04-10 ENCOUNTER — Inpatient Hospital Stay: Payer: BC Managed Care – PPO | Attending: Physician Assistant | Admitting: Hematology and Oncology

## 2021-04-10 VITALS — BP 130/70 | HR 82 | Temp 97.7°F | Resp 20 | Ht 67.75 in | Wt 239.2 lb

## 2021-04-10 DIAGNOSIS — R1030 Lower abdominal pain, unspecified: Secondary | ICD-10-CM | POA: Diagnosis not present

## 2021-04-10 DIAGNOSIS — Z808 Family history of malignant neoplasm of other organs or systems: Secondary | ICD-10-CM | POA: Insufficient documentation

## 2021-04-10 DIAGNOSIS — Z79899 Other long term (current) drug therapy: Secondary | ICD-10-CM | POA: Diagnosis not present

## 2021-04-10 DIAGNOSIS — C9210 Chronic myeloid leukemia, BCR/ABL-positive, not having achieved remission: Secondary | ICD-10-CM | POA: Insufficient documentation

## 2021-04-10 DIAGNOSIS — D72829 Elevated white blood cell count, unspecified: Secondary | ICD-10-CM | POA: Diagnosis not present
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
## 2021-04-10 NOTE — Telephone Encounter (Signed)
Oral Chemotherapy Pharmacist Encounter  I spoke with patient for overview of: Sprycel for the treatment of chronic phase chronic myeloid leukemia, planned duration until disease progression or unacceptable toxicity.  Counseled patient on administration, dosing, side effects, monitoring, drug-food interactions, safe handling, storage, and disposal. Patient's LMP was 03/10/21, patient endorses being on birth control.   Patient will take Sprycel 100 mg tablets, 1 tablet by mouth once daily without regard to food.  Patient instructed that administering with food may decrease GI irritation.   Patient knows to avoid grapefruit or grapefruit juice while on therapy with Sprycel.  Sprycel start date: patient will start once received from [place], anticipate this will be delivered to patient week of 04/13/21-04/17/21.  Adverse effects include but are not limited to: fluid retention, nausea, diarrhea, rash, fatigue, dyspnea, hemorrhage, and thrombocytopenia.  Patient has anti-emetic on hand and knows to take it if nausea develops.   Patient will obtain anti diarrheal and alert the office of 4 or more loose stools above baseline.  Reviewed with patient importance of keeping a medication schedule and plan for any missed doses. No barriers to medication adherence identified.  Medication reconciliation performed and medication/allergy list updated. Patient agreeable to not taking pantoprazole while on Sprycel. Medication list updated.   Insurance authorization for Sprycel has been obtained. Patient's insurance requires Sprycel to be filled through [place]. Phone number provided to set up medication shipment [phone])  All questions answered.  Ms. Teal and her mother voiced understanding and appreciation.   Medication education handout placed in mail for patient. Patient knows to call the office with questions or concerns. Oral Chemotherapy Clinic phone number provided to  patient.   Leron Croak, PharmD, BCPS Hematology/Oncology Clinical Pharmacist [facility] [phone] 04/10/2021 1:31 PM

## 2021-04-13 ENCOUNTER — Encounter: Payer: Self-pay | Admitting: Licensed Clinical Social Worker

## 2021-04-13 ENCOUNTER — Encounter: Payer: Self-pay | Admitting: General Practice

## 2021-04-13 NOTE — Progress Notes (Signed)
[place] Work  Clinical Social Work was referred by medical team for assessment of support needs around diagnosis.  Clinical Social Worker contacted caregiver by phone (mom- Abigail Butts) to offer support and assess for needs.    Pt is currently connected with counseling, but, per mom, therapist is not at all familiar with diagnosis.  They are looking for peer support group for Desiree Carlson as well as potentially extra one-on-one support around specific parts of diagnosis/ treatment.  CSW explained options of blood cancer support group and potential support from spiritual care/ [facility]. Mom is also interested in being added to caregiver support group.  CSW sent request to [facility] to contact mom re: more specifics about support group and for potential individual support for pt.    [place], [place] Worker [employer]

## 2021-04-13 NOTE — Progress Notes (Signed)
[place] Spiritual Care Note  Referred by Sharyn Lull Zavala/LCSW for support to Leaann's mother Abigail Butts, who has inquired about Blood Cancer Support Group, which I facilitate, and individual emotional support. Altamese Cabal by phone, providing information and answering questions; she has my direct-dial number and plans to reach out about next steps.   [place], [place], [facility] Pager [phone] Voicemail [phone]

## 2021-04-14 ENCOUNTER — Encounter (HOSPITAL_COMMUNITY): Payer: Self-pay | Admitting: Hematology and Oncology

## 2021-04-14 DIAGNOSIS — C9210 Chronic myeloid leukemia, BCR/ABL-positive, not having achieved remission: Secondary | ICD-10-CM | POA: Diagnosis not present

## 2021-04-20 DIAGNOSIS — C9210 Chronic myeloid leukemia, BCR/ABL-positive, not having achieved remission: Secondary | ICD-10-CM | POA: Diagnosis not present

## 2021-04-21 DIAGNOSIS — F419 Anxiety disorder, unspecified: Secondary | ICD-10-CM | POA: Diagnosis not present

## 2021-04-21 DIAGNOSIS — C9210 Chronic myeloid leukemia, BCR/ABL-positive, not having achieved remission: Secondary | ICD-10-CM | POA: Diagnosis not present

## 2021-04-22 ENCOUNTER — Telehealth: Payer: Self-pay | Admitting: *Deleted

## 2021-04-23 DIAGNOSIS — C9210 Chronic myeloid leukemia, BCR/ABL-positive, not having achieved remission: Secondary | ICD-10-CM | POA: Diagnosis not present

## 2021-04-23 DIAGNOSIS — Z6836 Body mass index (BMI) 36.0-36.9, adult: Secondary | ICD-10-CM | POA: Diagnosis not present

## 2021-04-24 ENCOUNTER — Telehealth: Payer: Self-pay | Admitting: *Deleted

## 2021-04-24 NOTE — Telephone Encounter (Signed)
[name] patient's mother , Tessla Spurling after noting in chart that patient seems to have established care @ [facility].  Spoke with Abigail Butts and asked how James was doing. She states that they had gone to [facility] for a 2nd opinion and have decided to stay with that practice. She states they offer additional services for patient in her age group as well as counseling etc.  Abigail Butts states she was pleased with [place] (Dr. Lorenso Courier) but the additional services [facility] offers has been very helpful. Encouraged her to call at any point in time should she need [place]. Advised that we will be here for her and Moesha if needed.  Appts for October and November has been cancelled per [facility] request.  Dr. Lorenso Courier made aware  of the above situation.

## 2021-04-27 DIAGNOSIS — R5383 Other fatigue: Secondary | ICD-10-CM | POA: Diagnosis not present

## 2021-04-27 DIAGNOSIS — J029 Acute pharyngitis, unspecified: Secondary | ICD-10-CM | POA: Diagnosis not present

## 2021-04-27 DIAGNOSIS — D72829 Elevated white blood cell count, unspecified: Secondary | ICD-10-CM | POA: Diagnosis not present

## 2021-04-27 DIAGNOSIS — D84821 Immunodeficiency due to drugs: Secondary | ICD-10-CM | POA: Diagnosis not present

## 2021-04-30 NOTE — Telephone Encounter (Signed)
Opened in error

## 2021-05-04 DIAGNOSIS — K219 Gastro-esophageal reflux disease without esophagitis: Secondary | ICD-10-CM | POA: Diagnosis not present

## 2021-05-04 DIAGNOSIS — R5383 Other fatigue: Secondary | ICD-10-CM | POA: Diagnosis not present

## 2021-05-04 DIAGNOSIS — R110 Nausea: Secondary | ICD-10-CM | POA: Diagnosis not present

## 2021-05-04 DIAGNOSIS — C9210 Chronic myeloid leukemia, BCR/ABL-positive, not having achieved remission: Secondary | ICD-10-CM | POA: Diagnosis not present

## 2021-05-04 DIAGNOSIS — R519 Headache, unspecified: Secondary | ICD-10-CM | POA: Diagnosis not present

## 2021-05-04 DIAGNOSIS — Z7189 Other specified counseling: Secondary | ICD-10-CM | POA: Diagnosis not present

## 2021-05-04 DIAGNOSIS — R197 Diarrhea, unspecified: Secondary | ICD-10-CM | POA: Diagnosis not present

## 2021-05-04 DIAGNOSIS — Z79899 Other long term (current) drug therapy: Secondary | ICD-10-CM | POA: Diagnosis not present

## 2021-05-04 LAB — SURGICAL PATHOLOGY

## 2021-05-14 ENCOUNTER — Ambulatory Visit: Payer: BC Managed Care – PPO | Admitting: Hematology and Oncology

## 2021-05-14 ENCOUNTER — Other Ambulatory Visit: Payer: BC Managed Care – PPO

## 2021-05-18 DIAGNOSIS — F4310 Post-traumatic stress disorder, unspecified: Secondary | ICD-10-CM | POA: Diagnosis not present

## 2021-05-18 DIAGNOSIS — F988 Other specified behavioral and emotional disorders with onset usually occurring in childhood and adolescence: Secondary | ICD-10-CM | POA: Diagnosis not present

## 2021-05-18 DIAGNOSIS — F339 Major depressive disorder, recurrent, unspecified: Secondary | ICD-10-CM | POA: Diagnosis not present

## 2021-05-18 DIAGNOSIS — F419 Anxiety disorder, unspecified: Secondary | ICD-10-CM | POA: Diagnosis not present

## 2021-05-21 DIAGNOSIS — R5383 Other fatigue: Secondary | ICD-10-CM | POA: Diagnosis not present

## 2021-05-21 DIAGNOSIS — F419 Anxiety disorder, unspecified: Secondary | ICD-10-CM | POA: Diagnosis not present

## 2021-05-21 DIAGNOSIS — Z7951 Long term (current) use of inhaled steroids: Secondary | ICD-10-CM | POA: Diagnosis not present

## 2021-05-21 DIAGNOSIS — R197 Diarrhea, unspecified: Secondary | ICD-10-CM | POA: Diagnosis not present

## 2021-05-21 DIAGNOSIS — K219 Gastro-esophageal reflux disease without esophagitis: Secondary | ICD-10-CM | POA: Diagnosis not present

## 2021-05-21 DIAGNOSIS — J069 Acute upper respiratory infection, unspecified: Secondary | ICD-10-CM | POA: Diagnosis not present

## 2021-05-21 DIAGNOSIS — C9210 Chronic myeloid leukemia, BCR/ABL-positive, not having achieved remission: Secondary | ICD-10-CM | POA: Diagnosis not present

## 2021-05-21 DIAGNOSIS — Z79899 Other long term (current) drug therapy: Secondary | ICD-10-CM | POA: Diagnosis not present

## 2021-05-21 DIAGNOSIS — M791 Myalgia, unspecified site: Secondary | ICD-10-CM | POA: Diagnosis not present

## 2021-05-21 DIAGNOSIS — G43909 Migraine, unspecified, not intractable, without status migrainosus: Secondary | ICD-10-CM | POA: Diagnosis not present

## 2021-05-21 DIAGNOSIS — R0602 Shortness of breath: Secondary | ICD-10-CM | POA: Diagnosis not present

## 2021-05-27 DIAGNOSIS — C9210 Chronic myeloid leukemia, BCR/ABL-positive, not having achieved remission: Secondary | ICD-10-CM | POA: Diagnosis not present

## 2021-05-28 DIAGNOSIS — F339 Major depressive disorder, recurrent, unspecified: Secondary | ICD-10-CM | POA: Diagnosis not present

## 2021-05-28 DIAGNOSIS — Z7182 Exercise counseling: Secondary | ICD-10-CM | POA: Diagnosis not present

## 2021-05-28 DIAGNOSIS — F419 Anxiety disorder, unspecified: Secondary | ICD-10-CM | POA: Diagnosis not present

## 2021-05-28 DIAGNOSIS — M898X9 Other specified disorders of bone, unspecified site: Secondary | ICD-10-CM | POA: Diagnosis not present

## 2021-05-28 DIAGNOSIS — R5383 Other fatigue: Secondary | ICD-10-CM | POA: Diagnosis not present

## 2021-05-28 DIAGNOSIS — R519 Headache, unspecified: Secondary | ICD-10-CM | POA: Diagnosis not present

## 2021-05-28 DIAGNOSIS — M791 Myalgia, unspecified site: Secondary | ICD-10-CM | POA: Diagnosis not present

## 2021-05-28 DIAGNOSIS — M549 Dorsalgia, unspecified: Secondary | ICD-10-CM | POA: Diagnosis not present

## 2021-05-28 DIAGNOSIS — C9210 Chronic myeloid leukemia, BCR/ABL-positive, not having achieved remission: Secondary | ICD-10-CM | POA: Diagnosis not present

## 2021-05-28 DIAGNOSIS — M79604 Pain in right leg: Secondary | ICD-10-CM | POA: Diagnosis not present

## 2021-05-28 DIAGNOSIS — M79605 Pain in left leg: Secondary | ICD-10-CM | POA: Diagnosis not present

## 2021-05-28 DIAGNOSIS — Z79899 Other long term (current) drug therapy: Secondary | ICD-10-CM | POA: Diagnosis not present

## 2021-06-15 DIAGNOSIS — F4310 Post-traumatic stress disorder, unspecified: Secondary | ICD-10-CM | POA: Diagnosis not present

## 2021-06-15 DIAGNOSIS — F339 Major depressive disorder, recurrent, unspecified: Secondary | ICD-10-CM | POA: Diagnosis not present

## 2021-06-15 DIAGNOSIS — R051 Acute cough: Secondary | ICD-10-CM | POA: Diagnosis not present

## 2021-06-15 DIAGNOSIS — R52 Pain, unspecified: Secondary | ICD-10-CM | POA: Diagnosis not present

## 2021-06-15 DIAGNOSIS — C9210 Chronic myeloid leukemia, BCR/ABL-positive, not having achieved remission: Secondary | ICD-10-CM | POA: Diagnosis not present

## 2021-06-15 DIAGNOSIS — F419 Anxiety disorder, unspecified: Secondary | ICD-10-CM | POA: Diagnosis not present

## 2021-06-15 DIAGNOSIS — F988 Other specified behavioral and emotional disorders with onset usually occurring in childhood and adolescence: Secondary | ICD-10-CM | POA: Diagnosis not present

## 2021-07-02 DIAGNOSIS — F339 Major depressive disorder, recurrent, unspecified: Secondary | ICD-10-CM | POA: Diagnosis not present

## 2021-07-02 DIAGNOSIS — Z881 Allergy status to other antibiotic agents status: Secondary | ICD-10-CM | POA: Diagnosis not present

## 2021-07-02 DIAGNOSIS — F32A Depression, unspecified: Secondary | ICD-10-CM | POA: Diagnosis not present

## 2021-07-02 DIAGNOSIS — C9210 Chronic myeloid leukemia, BCR/ABL-positive, not having achieved remission: Secondary | ICD-10-CM | POA: Diagnosis not present

## 2021-07-02 DIAGNOSIS — Z6838 Body mass index (BMI) 38.0-38.9, adult: Secondary | ICD-10-CM | POA: Diagnosis not present

## 2021-07-02 DIAGNOSIS — F419 Anxiety disorder, unspecified: Secondary | ICD-10-CM | POA: Diagnosis not present

## 2021-07-02 DIAGNOSIS — R197 Diarrhea, unspecified: Secondary | ICD-10-CM | POA: Diagnosis not present

## 2021-07-02 DIAGNOSIS — Z79899 Other long term (current) drug therapy: Secondary | ICD-10-CM | POA: Diagnosis not present

## 2021-07-08 DIAGNOSIS — C9210 Chronic myeloid leukemia, BCR/ABL-positive, not having achieved remission: Secondary | ICD-10-CM | POA: Diagnosis not present

## 2021-07-08 DIAGNOSIS — R5383 Other fatigue: Secondary | ICD-10-CM | POA: Diagnosis not present

## 2021-07-08 DIAGNOSIS — R5381 Other malaise: Secondary | ICD-10-CM | POA: Diagnosis not present

## 2021-07-13 DIAGNOSIS — F419 Anxiety disorder, unspecified: Secondary | ICD-10-CM | POA: Diagnosis not present

## 2021-07-13 DIAGNOSIS — F988 Other specified behavioral and emotional disorders with onset usually occurring in childhood and adolescence: Secondary | ICD-10-CM | POA: Diagnosis not present

## 2021-07-13 DIAGNOSIS — F339 Major depressive disorder, recurrent, unspecified: Secondary | ICD-10-CM | POA: Diagnosis not present

## 2021-07-13 DIAGNOSIS — F4310 Post-traumatic stress disorder, unspecified: Secondary | ICD-10-CM | POA: Diagnosis not present

## 2021-07-14 DIAGNOSIS — F332 Major depressive disorder, recurrent severe without psychotic features: Secondary | ICD-10-CM | POA: Diagnosis not present

## 2021-07-20 ENCOUNTER — Ambulatory Visit: Payer: BC Managed Care – PPO | Admitting: Hematology and Oncology

## 2021-07-20 ENCOUNTER — Other Ambulatory Visit: Payer: BC Managed Care – PPO

## 2021-07-31 DIAGNOSIS — R52 Pain, unspecified: Secondary | ICD-10-CM | POA: Diagnosis not present

## 2021-07-31 DIAGNOSIS — B349 Viral infection, unspecified: Secondary | ICD-10-CM | POA: Diagnosis not present

## 2021-07-31 DIAGNOSIS — Z20828 Contact with and (suspected) exposure to other viral communicable diseases: Secondary | ICD-10-CM | POA: Diagnosis not present

## 2021-08-12 DIAGNOSIS — R5383 Other fatigue: Secondary | ICD-10-CM | POA: Diagnosis not present

## 2021-08-12 DIAGNOSIS — R5381 Other malaise: Secondary | ICD-10-CM | POA: Diagnosis not present

## 2021-08-12 DIAGNOSIS — C9210 Chronic myeloid leukemia, BCR/ABL-positive, not having achieved remission: Secondary | ICD-10-CM | POA: Diagnosis not present

## 2021-08-18 DIAGNOSIS — F988 Other specified behavioral and emotional disorders with onset usually occurring in childhood and adolescence: Secondary | ICD-10-CM | POA: Diagnosis not present

## 2021-08-18 DIAGNOSIS — F339 Major depressive disorder, recurrent, unspecified: Secondary | ICD-10-CM | POA: Diagnosis not present

## 2021-08-18 DIAGNOSIS — F4310 Post-traumatic stress disorder, unspecified: Secondary | ICD-10-CM | POA: Diagnosis not present

## 2021-08-18 DIAGNOSIS — F419 Anxiety disorder, unspecified: Secondary | ICD-10-CM | POA: Diagnosis not present

## 2021-09-30 DIAGNOSIS — Z6839 Body mass index (BMI) 39.0-39.9, adult: Secondary | ICD-10-CM | POA: Diagnosis not present

## 2021-09-30 DIAGNOSIS — C9210 Chronic myeloid leukemia, BCR/ABL-positive, not having achieved remission: Secondary | ICD-10-CM | POA: Diagnosis not present

## 2021-10-05 ENCOUNTER — Other Ambulatory Visit: Payer: Self-pay

## 2021-10-05 ENCOUNTER — Other Ambulatory Visit (HOSPITAL_COMMUNITY)
Admission: RE | Admit: 2021-10-05 | Discharge: 2021-10-05 | Disposition: A | Discharge status: Home / Self Care | Payer: BC Managed Care – PPO | Source: Ambulatory Visit | Attending: Obstetrics and Gynecology | Admitting: Obstetrics and Gynecology

## 2021-10-05 ENCOUNTER — Ambulatory Visit (INDEPENDENT_AMBULATORY_CARE_PROVIDER_SITE_OTHER): Payer: BC Managed Care – PPO | Admitting: Obstetrics and Gynecology

## 2021-10-05 ENCOUNTER — Encounter: Payer: Self-pay | Admitting: Obstetrics and Gynecology

## 2021-10-05 VITALS — BP 122/70 | HR 83 | Ht 68.0 in | Wt 262.0 lb

## 2021-10-05 DIAGNOSIS — Z119 Encounter for screening for infectious and parasitic diseases, unspecified: Secondary | ICD-10-CM

## 2021-10-05 DIAGNOSIS — Z113 Encounter for screening for infections with a predominantly sexual mode of transmission: Secondary | ICD-10-CM | POA: Insufficient documentation

## 2021-10-05 DIAGNOSIS — Z01419 Encounter for gynecological examination (general) (routine) without abnormal findings: Secondary | ICD-10-CM

## 2021-10-05 NOTE — Patient Instructions (Signed)
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
## 2021-10-06 LAB — CYTOLOGY - PAP
Chlamydia: NEGATIVE
Comment: NEGATIVE
Comment: NEGATIVE
Comment: NEGATIVE
Comment: NORMAL
Diagnosis: NEGATIVE
High risk HPV: NEGATIVE
Neisseria Gonorrhea: NEGATIVE
Trichomonas: NEGATIVE

## 2021-10-06 LAB — HSV(HERPES SIMPLEX VRS) I + II AB-IGG
HAV 1 IGG,TYPE SPECIFIC AB: <0.9 index
HSV 2 IGG,TYPE SPECIFIC AB: <0.9 index

## 2021-10-06 LAB — HEPATITIS C ANTIBODY
Hepatitis C Ab: NONREACTIVE
SIGNAL TO CUT-OFF: 0.11 (ref <1.00)

## 2021-10-06 LAB — HIV ANTIBODY (ROUTINE TESTING W REFLEX): HIV 1&2 Ab, 4th Generation: NONREACTIVE

## 2021-10-06 LAB — RPR: RPR Ser Ql: NONREACTIVE
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
## 2021-11-04 DIAGNOSIS — F329 Major depressive disorder, single episode, unspecified: Secondary | ICD-10-CM | POA: Diagnosis not present

## 2021-11-04 DIAGNOSIS — F064 Anxiety disorder due to known physiological condition: Secondary | ICD-10-CM | POA: Diagnosis not present

## 2021-11-04 DIAGNOSIS — F4312 Post-traumatic stress disorder, chronic: Secondary | ICD-10-CM | POA: Diagnosis not present

## 2021-11-05 ENCOUNTER — Ambulatory Visit: Payer: BC Managed Care – PPO | Admitting: Obstetrics and Gynecology

## 2021-11-19 ENCOUNTER — Ambulatory Visit (INDEPENDENT_AMBULATORY_CARE_PROVIDER_SITE_OTHER): Payer: BC Managed Care – PPO | Admitting: Radiology

## 2021-11-19 ENCOUNTER — Encounter: Payer: Self-pay | Admitting: Radiology

## 2021-11-19 VITALS — BP 114/74

## 2021-11-19 DIAGNOSIS — F329 Major depressive disorder, single episode, unspecified: Secondary | ICD-10-CM | POA: Diagnosis not present

## 2021-11-19 DIAGNOSIS — F4312 Post-traumatic stress disorder, chronic: Secondary | ICD-10-CM | POA: Diagnosis not present

## 2021-11-19 DIAGNOSIS — F064 Anxiety disorder due to known physiological condition: Secondary | ICD-10-CM | POA: Diagnosis not present

## 2021-11-19 DIAGNOSIS — N9089 Other specified noninflammatory disorders of vulva and perineum: Secondary | ICD-10-CM

## 2021-11-19 DIAGNOSIS — Z113 Encounter for screening for infections with a predominantly sexual mode of transmission: Secondary | ICD-10-CM | POA: Diagnosis not present

## 2021-11-19 NOTE — Progress Notes (Signed)
? ? ? ? ?  Subjective: Desiree Carlson is a 26 y.o. female who complains of a right vulvar lesion/ulcer (nails cut area 2 weeks ago), also with ingrown hair upper left groin/crease of leg (x's 1 year), also had unprotected intercourse last Wednesday (used Plan B Thursday morning) and is concerned about pregnancy and STDs (had negative STD testing 09/2021). Was supposed to return for IUD insertion 10/2021 (wants to make sure she does not have STDs prior to insertion---also with hx Leukemia (dx'd 03/2021). ? ?Review of Systems otherwise negative  ? ?Objective: ? ?-Vulva: without discharge, vesicular lesion present mid right labia minora, cultured ?-Vagina: discharge present, aptima swab obtained ?-Cervix: no lesion or discharge, no CMT ?-Perineum: no lesions ?-Uterus: Mobile, non tender ?-Adnexa: no masses or tenderness ? ?Chaperone offered and declined. ? ?Assessment:/Plan:  ?1. Screening examination for STD (sexually transmitted disease) ?STD serology in 4-6 weeks ?May take a UPT in 2 weeks if no menses ?Reassured bleeding may be irregular after taking Plan B ?- SURESWAB CT/NG/T. vaginalis ? ?2. Vulvar lesion ?Comfort measures reviewed ?Will contact with results of culture and manage accordingly ?- SureSwab HSV, Type 1/2 DNA, PCR  ? ?Plans to reschedule IUD insertion soon ?Will contact patient with results of testing completed today. Avoid intercourse until symptoms are resolved. Safe sex encouraged. Avoid the use of soaps or perfumed products in the peri area. Avoid tub baths and sitting in sweaty or wet clothing for prolonged periods of time.   ?

## 2021-11-20 LAB — SURESWAB CT/NG/T. VAGINALIS
C. trachomatis RNA, TMA: NOT DETECTED
N. gonorrhoeae RNA, TMA: NOT DETECTED
Trichomonas vaginalis RNA: NOT DETECTED

## 2021-11-25 DIAGNOSIS — R079 Chest pain, unspecified: Secondary | ICD-10-CM | POA: Diagnosis not present

## 2021-11-25 DIAGNOSIS — J90 Pleural effusion, not elsewhere classified: Secondary | ICD-10-CM | POA: Diagnosis not present

## 2021-11-25 DIAGNOSIS — M549 Dorsalgia, unspecified: Secondary | ICD-10-CM | POA: Diagnosis not present

## 2021-11-25 DIAGNOSIS — Z6839 Body mass index (BMI) 39.0-39.9, adult: Secondary | ICD-10-CM | POA: Diagnosis not present

## 2021-11-25 DIAGNOSIS — F419 Anxiety disorder, unspecified: Secondary | ICD-10-CM | POA: Diagnosis not present

## 2021-11-25 DIAGNOSIS — Z79899 Other long term (current) drug therapy: Secondary | ICD-10-CM | POA: Diagnosis not present

## 2021-11-25 DIAGNOSIS — Z7951 Long term (current) use of inhaled steroids: Secondary | ICD-10-CM | POA: Diagnosis not present

## 2021-11-25 DIAGNOSIS — C9210 Chronic myeloid leukemia, BCR/ABL-positive, not having achieved remission: Secondary | ICD-10-CM | POA: Diagnosis not present

## 2021-11-25 LAB — SURESWAB HSV, TYPE 1/2 DNA, PCR
HSV 1 DNA: NOT DETECTED
HSV 2 DNA: NOT DETECTED

## 2021-11-26 DIAGNOSIS — C9210 Chronic myeloid leukemia, BCR/ABL-positive, not having achieved remission: Secondary | ICD-10-CM | POA: Diagnosis not present

## 2021-12-04 DIAGNOSIS — F339 Major depressive disorder, recurrent, unspecified: Secondary | ICD-10-CM | POA: Diagnosis not present

## 2021-12-04 DIAGNOSIS — F988 Other specified behavioral and emotional disorders with onset usually occurring in childhood and adolescence: Secondary | ICD-10-CM | POA: Diagnosis not present

## 2021-12-04 DIAGNOSIS — F419 Anxiety disorder, unspecified: Secondary | ICD-10-CM | POA: Diagnosis not present

## 2021-12-04 DIAGNOSIS — F4310 Post-traumatic stress disorder, unspecified: Secondary | ICD-10-CM | POA: Diagnosis not present

## 2021-12-10 DIAGNOSIS — F064 Anxiety disorder due to known physiological condition: Secondary | ICD-10-CM | POA: Diagnosis not present

## 2021-12-10 DIAGNOSIS — F4312 Post-traumatic stress disorder, chronic: Secondary | ICD-10-CM | POA: Diagnosis not present

## 2021-12-10 DIAGNOSIS — F329 Major depressive disorder, single episode, unspecified: Secondary | ICD-10-CM | POA: Diagnosis not present

## 2021-12-24 DIAGNOSIS — R21 Rash and other nonspecific skin eruption: Secondary | ICD-10-CM | POA: Diagnosis not present

## 2021-12-24 DIAGNOSIS — F419 Anxiety disorder, unspecified: Secondary | ICD-10-CM | POA: Diagnosis not present

## 2021-12-24 DIAGNOSIS — C9211 Chronic myeloid leukemia, BCR/ABL-positive, in remission: Secondary | ICD-10-CM | POA: Diagnosis not present

## 2021-12-24 DIAGNOSIS — R519 Headache, unspecified: Secondary | ICD-10-CM | POA: Diagnosis not present

## 2021-12-24 DIAGNOSIS — M549 Dorsalgia, unspecified: Secondary | ICD-10-CM | POA: Diagnosis not present

## 2021-12-24 DIAGNOSIS — Z9221 Personal history of antineoplastic chemotherapy: Secondary | ICD-10-CM | POA: Diagnosis not present

## 2021-12-24 DIAGNOSIS — F329 Major depressive disorder, single episode, unspecified: Secondary | ICD-10-CM | POA: Diagnosis not present

## 2021-12-24 DIAGNOSIS — Z6838 Body mass index (BMI) 38.0-38.9, adult: Secondary | ICD-10-CM | POA: Diagnosis not present

## 2021-12-24 DIAGNOSIS — Z7951 Long term (current) use of inhaled steroids: Secondary | ICD-10-CM | POA: Diagnosis not present

## 2021-12-24 DIAGNOSIS — R079 Chest pain, unspecified: Secondary | ICD-10-CM | POA: Diagnosis not present

## 2021-12-24 DIAGNOSIS — Z79899 Other long term (current) drug therapy: Secondary | ICD-10-CM | POA: Diagnosis not present

## 2021-12-24 DIAGNOSIS — C9210 Chronic myeloid leukemia, BCR/ABL-positive, not having achieved remission: Secondary | ICD-10-CM | POA: Diagnosis not present

## 2021-12-24 DIAGNOSIS — J45909 Unspecified asthma, uncomplicated: Secondary | ICD-10-CM | POA: Diagnosis not present

## 2021-12-25 DIAGNOSIS — F064 Anxiety disorder due to known physiological condition: Secondary | ICD-10-CM | POA: Diagnosis not present

## 2021-12-25 DIAGNOSIS — L710 Perioral dermatitis: Secondary | ICD-10-CM | POA: Diagnosis not present

## 2021-12-25 DIAGNOSIS — F4312 Post-traumatic stress disorder, chronic: Secondary | ICD-10-CM | POA: Diagnosis not present

## 2021-12-25 DIAGNOSIS — L219 Seborrheic dermatitis, unspecified: Secondary | ICD-10-CM | POA: Diagnosis not present

## 2021-12-25 DIAGNOSIS — D229 Melanocytic nevi, unspecified: Secondary | ICD-10-CM | POA: Diagnosis not present

## 2021-12-25 DIAGNOSIS — F424 Excoriation (skin-picking) disorder: Secondary | ICD-10-CM | POA: Diagnosis not present

## 2021-12-25 DIAGNOSIS — F329 Major depressive disorder, single episode, unspecified: Secondary | ICD-10-CM | POA: Diagnosis not present

## 2021-12-25 DIAGNOSIS — R21 Rash and other nonspecific skin eruption: Secondary | ICD-10-CM | POA: Diagnosis not present

## 2021-12-25 DIAGNOSIS — C9210 Chronic myeloid leukemia, BCR/ABL-positive, not having achieved remission: Secondary | ICD-10-CM | POA: Diagnosis not present

## 2021-12-29 DIAGNOSIS — R1084 Generalized abdominal pain: Secondary | ICD-10-CM | POA: Diagnosis not present

## 2021-12-29 DIAGNOSIS — K5900 Constipation, unspecified: Secondary | ICD-10-CM | POA: Diagnosis not present

## 2021-12-31 DIAGNOSIS — M25511 Pain in right shoulder: Secondary | ICD-10-CM | POA: Diagnosis not present

## 2021-12-31 DIAGNOSIS — M791 Myalgia, unspecified site: Secondary | ICD-10-CM | POA: Diagnosis not present

## 2021-12-31 DIAGNOSIS — R0789 Other chest pain: Secondary | ICD-10-CM | POA: Diagnosis not present

## 2021-12-31 DIAGNOSIS — R5383 Other fatigue: Secondary | ICD-10-CM | POA: Diagnosis not present

## 2021-12-31 DIAGNOSIS — Z6838 Body mass index (BMI) 38.0-38.9, adult: Secondary | ICD-10-CM | POA: Diagnosis not present

## 2021-12-31 DIAGNOSIS — M549 Dorsalgia, unspecified: Secondary | ICD-10-CM | POA: Diagnosis not present

## 2021-12-31 DIAGNOSIS — C9210 Chronic myeloid leukemia, BCR/ABL-positive, not having achieved remission: Secondary | ICD-10-CM | POA: Diagnosis not present

## 2021-12-31 DIAGNOSIS — M898X9 Other specified disorders of bone, unspecified site: Secondary | ICD-10-CM | POA: Diagnosis not present

## 2021-12-31 DIAGNOSIS — R530 Neoplastic (malignant) related fatigue: Secondary | ICD-10-CM | POA: Diagnosis not present

## 2021-12-31 DIAGNOSIS — R519 Headache, unspecified: Secondary | ICD-10-CM | POA: Diagnosis not present

## 2021-12-31 DIAGNOSIS — R109 Unspecified abdominal pain: Secondary | ICD-10-CM | POA: Diagnosis not present

## 2022-01-05 DIAGNOSIS — F064 Anxiety disorder due to known physiological condition: Secondary | ICD-10-CM | POA: Diagnosis not present

## 2022-01-05 DIAGNOSIS — F4312 Post-traumatic stress disorder, chronic: Secondary | ICD-10-CM | POA: Diagnosis not present

## 2022-01-05 DIAGNOSIS — F329 Major depressive disorder, single episode, unspecified: Secondary | ICD-10-CM | POA: Diagnosis not present

## 2022-01-12 DIAGNOSIS — F4310 Post-traumatic stress disorder, unspecified: Secondary | ICD-10-CM | POA: Diagnosis not present

## 2022-01-12 DIAGNOSIS — F339 Major depressive disorder, recurrent, unspecified: Secondary | ICD-10-CM | POA: Diagnosis not present

## 2022-01-12 DIAGNOSIS — F419 Anxiety disorder, unspecified: Secondary | ICD-10-CM | POA: Diagnosis not present

## 2022-01-12 DIAGNOSIS — F988 Other specified behavioral and emotional disorders with onset usually occurring in childhood and adolescence: Secondary | ICD-10-CM | POA: Diagnosis not present

## 2022-01-14 DIAGNOSIS — L710 Perioral dermatitis: Secondary | ICD-10-CM | POA: Diagnosis not present

## 2022-01-14 DIAGNOSIS — R21 Rash and other nonspecific skin eruption: Secondary | ICD-10-CM | POA: Diagnosis not present

## 2022-01-14 DIAGNOSIS — L24A9 Irritant contact dermatitis due friction or contact with other specified body fluids: Secondary | ICD-10-CM | POA: Diagnosis not present

## 2022-01-14 DIAGNOSIS — L219 Seborrheic dermatitis, unspecified: Secondary | ICD-10-CM | POA: Diagnosis not present

## 2022-01-19 DIAGNOSIS — K219 Gastro-esophageal reflux disease without esophagitis: Secondary | ICD-10-CM | POA: Diagnosis not present

## 2022-01-19 DIAGNOSIS — C9210 Chronic myeloid leukemia, BCR/ABL-positive, not having achieved remission: Secondary | ICD-10-CM | POA: Diagnosis not present

## 2022-01-19 DIAGNOSIS — K582 Mixed irritable bowel syndrome: Secondary | ICD-10-CM | POA: Diagnosis not present

## 2022-01-19 DIAGNOSIS — Z6838 Body mass index (BMI) 38.0-38.9, adult: Secondary | ICD-10-CM | POA: Diagnosis not present

## 2022-01-19 DIAGNOSIS — M549 Dorsalgia, unspecified: Secondary | ICD-10-CM | POA: Diagnosis not present

## 2022-01-19 DIAGNOSIS — K649 Unspecified hemorrhoids: Secondary | ICD-10-CM | POA: Diagnosis not present

## 2022-01-20 DIAGNOSIS — K649 Unspecified hemorrhoids: Secondary | ICD-10-CM | POA: Insufficient documentation

## 2022-01-21 DIAGNOSIS — F419 Anxiety disorder, unspecified: Secondary | ICD-10-CM | POA: Diagnosis not present

## 2022-01-21 DIAGNOSIS — K2930 Chronic superficial gastritis without bleeding: Secondary | ICD-10-CM | POA: Diagnosis not present

## 2022-01-21 DIAGNOSIS — J45909 Unspecified asthma, uncomplicated: Secondary | ICD-10-CM | POA: Diagnosis not present

## 2022-01-21 DIAGNOSIS — F4310 Post-traumatic stress disorder, unspecified: Secondary | ICD-10-CM | POA: Diagnosis not present

## 2022-01-21 DIAGNOSIS — F909 Attention-deficit hyperactivity disorder, unspecified type: Secondary | ICD-10-CM | POA: Diagnosis not present

## 2022-01-21 DIAGNOSIS — R1013 Epigastric pain: Secondary | ICD-10-CM | POA: Diagnosis not present

## 2022-01-21 DIAGNOSIS — K3189 Other diseases of stomach and duodenum: Secondary | ICD-10-CM | POA: Diagnosis not present

## 2022-01-21 DIAGNOSIS — C9210 Chronic myeloid leukemia, BCR/ABL-positive, not having achieved remission: Secondary | ICD-10-CM | POA: Diagnosis not present

## 2022-01-21 DIAGNOSIS — K582 Mixed irritable bowel syndrome: Secondary | ICD-10-CM | POA: Diagnosis not present

## 2022-01-21 DIAGNOSIS — F329 Major depressive disorder, single episode, unspecified: Secondary | ICD-10-CM | POA: Diagnosis not present

## 2022-01-21 DIAGNOSIS — R12 Heartburn: Secondary | ICD-10-CM | POA: Diagnosis not present

## 2022-01-21 DIAGNOSIS — K219 Gastro-esophageal reflux disease without esophagitis: Secondary | ICD-10-CM | POA: Diagnosis not present

## 2022-01-21 DIAGNOSIS — Z8616 Personal history of COVID-19: Secondary | ICD-10-CM | POA: Diagnosis not present

## 2022-01-21 DIAGNOSIS — Z79899 Other long term (current) drug therapy: Secondary | ICD-10-CM | POA: Diagnosis not present

## 2022-01-21 DIAGNOSIS — F4323 Adjustment disorder with mixed anxiety and depressed mood: Secondary | ICD-10-CM | POA: Diagnosis not present

## 2022-01-22 DIAGNOSIS — K6289 Other specified diseases of anus and rectum: Secondary | ICD-10-CM | POA: Diagnosis not present

## 2022-01-22 DIAGNOSIS — R150 Incomplete defecation: Secondary | ICD-10-CM | POA: Diagnosis not present

## 2022-01-22 DIAGNOSIS — K589 Irritable bowel syndrome without diarrhea: Secondary | ICD-10-CM | POA: Diagnosis not present

## 2022-01-22 DIAGNOSIS — K5900 Constipation, unspecified: Secondary | ICD-10-CM | POA: Diagnosis not present

## 2022-01-26 DIAGNOSIS — F4312 Post-traumatic stress disorder, chronic: Secondary | ICD-10-CM | POA: Diagnosis not present

## 2022-01-26 DIAGNOSIS — F329 Major depressive disorder, single episode, unspecified: Secondary | ICD-10-CM | POA: Diagnosis not present

## 2022-01-26 DIAGNOSIS — F064 Anxiety disorder due to known physiological condition: Secondary | ICD-10-CM | POA: Diagnosis not present

## 2022-01-26 DIAGNOSIS — K5902 Outlet dysfunction constipation: Secondary | ICD-10-CM | POA: Insufficient documentation

## 2022-01-28 DIAGNOSIS — Z79899 Other long term (current) drug therapy: Secondary | ICD-10-CM | POA: Diagnosis not present

## 2022-01-28 DIAGNOSIS — G43109 Migraine with aura, not intractable, without status migrainosus: Secondary | ICD-10-CM | POA: Diagnosis not present

## 2022-01-28 DIAGNOSIS — Z6838 Body mass index (BMI) 38.0-38.9, adult: Secondary | ICD-10-CM | POA: Diagnosis not present

## 2022-01-28 DIAGNOSIS — F988 Other specified behavioral and emotional disorders with onset usually occurring in childhood and adolescence: Secondary | ICD-10-CM | POA: Diagnosis not present

## 2022-01-28 DIAGNOSIS — K589 Irritable bowel syndrome without diarrhea: Secondary | ICD-10-CM | POA: Diagnosis not present

## 2022-01-28 DIAGNOSIS — Z8616 Personal history of COVID-19: Secondary | ICD-10-CM | POA: Diagnosis not present

## 2022-01-28 DIAGNOSIS — K219 Gastro-esophageal reflux disease without esophagitis: Secondary | ICD-10-CM | POA: Diagnosis not present

## 2022-01-28 DIAGNOSIS — C9210 Chronic myeloid leukemia, BCR/ABL-positive, not having achieved remission: Secondary | ICD-10-CM | POA: Diagnosis not present

## 2022-01-28 DIAGNOSIS — F4310 Post-traumatic stress disorder, unspecified: Secondary | ICD-10-CM | POA: Diagnosis not present
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
## 2022-02-09 ENCOUNTER — Ambulatory Visit: Payer: BC Managed Care – PPO | Attending: Student | Admitting: Physical Therapy

## 2022-02-10 DIAGNOSIS — F4310 Post-traumatic stress disorder, unspecified: Secondary | ICD-10-CM | POA: Diagnosis not present

## 2022-02-10 DIAGNOSIS — F339 Major depressive disorder, recurrent, unspecified: Secondary | ICD-10-CM | POA: Diagnosis not present

## 2022-02-10 DIAGNOSIS — F988 Other specified behavioral and emotional disorders with onset usually occurring in childhood and adolescence: Secondary | ICD-10-CM | POA: Diagnosis not present

## 2022-02-10 DIAGNOSIS — F419 Anxiety disorder, unspecified: Secondary | ICD-10-CM | POA: Diagnosis not present

## 2022-02-17 DIAGNOSIS — R300 Dysuria: Secondary | ICD-10-CM | POA: Diagnosis not present

## 2022-02-25 DIAGNOSIS — N390 Urinary tract infection, site not specified: Secondary | ICD-10-CM | POA: Diagnosis not present

## 2022-02-25 DIAGNOSIS — M549 Dorsalgia, unspecified: Secondary | ICD-10-CM | POA: Diagnosis not present

## 2022-02-25 DIAGNOSIS — G43109 Migraine with aura, not intractable, without status migrainosus: Secondary | ICD-10-CM | POA: Diagnosis not present

## 2022-02-25 DIAGNOSIS — C9210 Chronic myeloid leukemia, BCR/ABL-positive, not having achieved remission: Secondary | ICD-10-CM | POA: Diagnosis not present

## 2022-02-25 DIAGNOSIS — R221 Localized swelling, mass and lump, neck: Secondary | ICD-10-CM | POA: Diagnosis not present

## 2022-02-25 DIAGNOSIS — Z1159 Encounter for screening for other viral diseases: Secondary | ICD-10-CM | POA: Diagnosis not present

## 2022-03-01 ENCOUNTER — Encounter (HOSPITAL_BASED_OUTPATIENT_CLINIC_OR_DEPARTMENT_OTHER): Payer: Self-pay | Admitting: Emergency Medicine

## 2022-03-01 ENCOUNTER — Emergency Department (HOSPITAL_BASED_OUTPATIENT_CLINIC_OR_DEPARTMENT_OTHER)
Admission: EM | Admit: 2022-03-01 | Discharge: 2022-03-01 | Discharge status: Against Medical Advice | Payer: BC Managed Care – PPO | Attending: Emergency Medicine | Admitting: Emergency Medicine

## 2022-03-01 ENCOUNTER — Other Ambulatory Visit: Payer: Self-pay

## 2022-03-01 DIAGNOSIS — R531 Weakness: Secondary | ICD-10-CM | POA: Diagnosis not present

## 2022-03-01 DIAGNOSIS — Z5321 Procedure and treatment not carried out due to patient leaving prior to being seen by health care provider: Secondary | ICD-10-CM | POA: Diagnosis not present

## 2022-03-01 DIAGNOSIS — M7918 Myalgia, other site: Secondary | ICD-10-CM | POA: Insufficient documentation

## 2022-03-01 LAB — CBC WITH DIFFERENTIAL/PLATELET
Abs Immature Granulocytes: 0.04 10*3/uL (ref 0.00–0.07)
Basophils Absolute: 0.1 10*3/uL (ref 0.0–0.1)
Basophils Relative: 1 %
Eosinophils Absolute: 0.3 10*3/uL (ref 0.0–0.5)
Eosinophils Relative: 2 %
HCT: 39.4 % (ref 36.0–46.0)
Hemoglobin: 13.3 g/dL (ref 12.0–15.0)
Immature Granulocytes: 0 %
Lymphocytes Relative: 35 %
Lymphs Abs: 5.3 10*3/uL — ABNORMAL HIGH (ref 0.7–4.0)
MCH: 29 pg (ref 26.0–34.0)
MCHC: 33.8 g/dL (ref 30.0–36.0)
MCV: 85.8 fL (ref 80.0–100.0)
Monocytes Absolute: 1.3 10*3/uL — ABNORMAL HIGH (ref 0.1–1.0)
Monocytes Relative: 9 %
Neutro Abs: 7.9 10*3/uL — ABNORMAL HIGH (ref 1.7–7.7)
Neutrophils Relative %: 53 %
Platelets: 295 10*3/uL (ref 150–400)
RBC: 4.59 MIL/uL (ref 3.87–5.11)
RDW: 13.3 % (ref 11.5–15.5)
WBC: 14.9 10*3/uL — ABNORMAL HIGH (ref 4.0–10.5)
nRBC: 0 % (ref 0.0–0.2)

## 2022-03-01 LAB — URINALYSIS, ROUTINE W REFLEX MICROSCOPIC
Bilirubin Urine: NEGATIVE
Glucose, UA: NEGATIVE mg/dL
Hgb urine dipstick: NEGATIVE
Ketones, ur: NEGATIVE mg/dL
Leukocytes,Ua: NEGATIVE
Nitrite: NEGATIVE
Protein, ur: NEGATIVE mg/dL
Specific Gravity, Urine: <1.005 — ABNORMAL LOW (ref 1.005–1.030)
pH: 6.5 (ref 5.0–8.0)

## 2022-03-01 LAB — COMPREHENSIVE METABOLIC PANEL
ALT: 37 U/L (ref 0–44)
AST: 28 U/L (ref 15–41)
Albumin: 4.6 g/dL (ref 3.5–5.0)
Alkaline Phosphatase: 69 U/L (ref 38–126)
Anion gap: 10 (ref 5–15)
BUN: 8 mg/dL (ref 6–20)
CO2: 23 mmol/L (ref 22–32)
Calcium: 9.1 mg/dL (ref 8.9–10.3)
Chloride: 105 mmol/L (ref 98–111)
Creatinine, Ser: 0.76 mg/dL (ref 0.44–1.00)
GFR, Estimated: >60 mL/min (ref >60)
Glucose, Bld: 88 mg/dL (ref 70–99)
Potassium: 4 mmol/L (ref 3.5–5.1)
Sodium: 138 mmol/L (ref 135–145)
Total Bilirubin: 0.4 mg/dL (ref 0.3–1.2)
Total Protein: 7.4 g/dL (ref 6.5–8.1)

## 2022-03-01 LAB — PREGNANCY, URINE: Preg Test, Ur: NEGATIVE

## 2022-03-01 LAB — LIPASE, BLOOD: Lipase: 25 U/L (ref 11–51)

## 2022-03-01 NOTE — ED Triage Notes (Signed)
Pt here from home with c/o general pain and weakness , just finished antibiotics for an uti , sent over for possible MRI ,

## 2022-03-03 ENCOUNTER — Ambulatory Visit: Payer: BC Managed Care – PPO | Admitting: Physical Therapy

## 2022-03-03 DIAGNOSIS — R319 Hematuria, unspecified: Secondary | ICD-10-CM | POA: Insufficient documentation

## 2022-03-03 DIAGNOSIS — G8929 Other chronic pain: Secondary | ICD-10-CM | POA: Diagnosis not present

## 2022-03-03 DIAGNOSIS — C9210 Chronic myeloid leukemia, BCR/ABL-positive, not having achieved remission: Secondary | ICD-10-CM | POA: Diagnosis not present

## 2022-03-03 DIAGNOSIS — F339 Major depressive disorder, recurrent, unspecified: Secondary | ICD-10-CM | POA: Diagnosis not present

## 2022-03-03 DIAGNOSIS — R300 Dysuria: Secondary | ICD-10-CM | POA: Diagnosis not present

## 2022-03-03 DIAGNOSIS — Z79899 Other long term (current) drug therapy: Secondary | ICD-10-CM | POA: Diagnosis not present

## 2022-03-03 DIAGNOSIS — Z8616 Personal history of COVID-19: Secondary | ICD-10-CM | POA: Diagnosis not present

## 2022-03-03 DIAGNOSIS — R519 Headache, unspecified: Secondary | ICD-10-CM | POA: Diagnosis not present

## 2022-03-03 DIAGNOSIS — J45909 Unspecified asthma, uncomplicated: Secondary | ICD-10-CM | POA: Diagnosis not present

## 2022-03-03 DIAGNOSIS — F419 Anxiety disorder, unspecified: Secondary | ICD-10-CM | POA: Diagnosis not present

## 2022-03-03 DIAGNOSIS — M549 Dorsalgia, unspecified: Secondary | ICD-10-CM | POA: Diagnosis not present

## 2022-03-03 DIAGNOSIS — G43909 Migraine, unspecified, not intractable, without status migrainosus: Secondary | ICD-10-CM | POA: Diagnosis not present

## 2022-03-05 ENCOUNTER — Other Ambulatory Visit: Payer: Self-pay | Admitting: Adult Health

## 2022-03-05 DIAGNOSIS — C9210 Chronic myeloid leukemia, BCR/ABL-positive, not having achieved remission: Secondary | ICD-10-CM

## 2022-03-05 DIAGNOSIS — R1084 Generalized abdominal pain: Secondary | ICD-10-CM

## 2022-03-05 DIAGNOSIS — R197 Diarrhea, unspecified: Secondary | ICD-10-CM

## 2022-03-05 DIAGNOSIS — R110 Nausea: Secondary | ICD-10-CM

## 2022-03-05 DIAGNOSIS — R490 Dysphonia: Secondary | ICD-10-CM

## 2022-03-05 DIAGNOSIS — G43109 Migraine with aura, not intractable, without status migrainosus: Secondary | ICD-10-CM

## 2022-03-05 DIAGNOSIS — R221 Localized swelling, mass and lump, neck: Secondary | ICD-10-CM

## 2022-03-09 DIAGNOSIS — M546 Pain in thoracic spine: Secondary | ICD-10-CM | POA: Diagnosis not present

## 2022-03-09 DIAGNOSIS — R2689 Other abnormalities of gait and mobility: Secondary | ICD-10-CM | POA: Diagnosis not present

## 2022-03-09 DIAGNOSIS — R5383 Other fatigue: Secondary | ICD-10-CM | POA: Diagnosis not present

## 2022-03-09 DIAGNOSIS — M545 Low back pain, unspecified: Secondary | ICD-10-CM | POA: Diagnosis not present

## 2022-03-09 DIAGNOSIS — M549 Dorsalgia, unspecified: Secondary | ICD-10-CM | POA: Diagnosis not present

## 2022-03-09 DIAGNOSIS — C9210 Chronic myeloid leukemia, BCR/ABL-positive, not having achieved remission: Secondary | ICD-10-CM | POA: Diagnosis not present

## 2022-03-09 DIAGNOSIS — M6281 Muscle weakness (generalized): Secondary | ICD-10-CM | POA: Diagnosis not present

## 2022-03-09 DIAGNOSIS — R29898 Other symptoms and signs involving the musculoskeletal system: Secondary | ICD-10-CM | POA: Diagnosis not present

## 2022-03-10 DIAGNOSIS — F332 Major depressive disorder, recurrent severe without psychotic features: Secondary | ICD-10-CM | POA: Diagnosis not present
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
## 2022-03-11 ENCOUNTER — Encounter: Payer: Self-pay | Admitting: Physical Therapy

## 2022-03-11 ENCOUNTER — Ambulatory Visit: Payer: BC Managed Care – PPO | Attending: Student | Admitting: Physical Therapy

## 2022-03-11 DIAGNOSIS — M6281 Muscle weakness (generalized): Secondary | ICD-10-CM | POA: Diagnosis not present

## 2022-03-11 DIAGNOSIS — M62838 Other muscle spasm: Secondary | ICD-10-CM | POA: Diagnosis not present

## 2022-03-12 DIAGNOSIS — R29898 Other symptoms and signs involving the musculoskeletal system: Secondary | ICD-10-CM | POA: Diagnosis not present

## 2022-03-12 DIAGNOSIS — M545 Low back pain, unspecified: Secondary | ICD-10-CM | POA: Diagnosis not present

## 2022-03-12 DIAGNOSIS — M546 Pain in thoracic spine: Secondary | ICD-10-CM | POA: Diagnosis not present

## 2022-03-12 DIAGNOSIS — R2689 Other abnormalities of gait and mobility: Secondary | ICD-10-CM | POA: Diagnosis not present

## 2022-03-12 DIAGNOSIS — R5383 Other fatigue: Secondary | ICD-10-CM | POA: Diagnosis not present

## 2022-03-12 DIAGNOSIS — M549 Dorsalgia, unspecified: Secondary | ICD-10-CM | POA: Diagnosis not present

## 2022-03-12 DIAGNOSIS — M6281 Muscle weakness (generalized): Secondary | ICD-10-CM | POA: Diagnosis not present

## 2022-03-12 DIAGNOSIS — C9210 Chronic myeloid leukemia, BCR/ABL-positive, not having achieved remission: Secondary | ICD-10-CM | POA: Diagnosis not present

## 2022-03-16 DIAGNOSIS — F329 Major depressive disorder, single episode, unspecified: Secondary | ICD-10-CM | POA: Diagnosis not present

## 2022-03-16 DIAGNOSIS — Z888 Allergy status to other drugs, medicaments and biological substances status: Secondary | ICD-10-CM | POA: Diagnosis not present

## 2022-03-16 DIAGNOSIS — M5442 Lumbago with sciatica, left side: Secondary | ICD-10-CM | POA: Diagnosis not present

## 2022-03-16 DIAGNOSIS — N83201 Unspecified ovarian cyst, right side: Secondary | ICD-10-CM | POA: Diagnosis not present

## 2022-03-16 DIAGNOSIS — J45909 Unspecified asthma, uncomplicated: Secondary | ICD-10-CM | POA: Diagnosis not present

## 2022-03-16 DIAGNOSIS — R1032 Left lower quadrant pain: Secondary | ICD-10-CM | POA: Diagnosis not present

## 2022-03-16 DIAGNOSIS — R1084 Generalized abdominal pain: Secondary | ICD-10-CM | POA: Diagnosis not present

## 2022-03-16 DIAGNOSIS — R590 Localized enlarged lymph nodes: Secondary | ICD-10-CM | POA: Diagnosis not present

## 2022-03-16 DIAGNOSIS — Z79899 Other long term (current) drug therapy: Secondary | ICD-10-CM | POA: Diagnosis not present

## 2022-03-16 DIAGNOSIS — Z881 Allergy status to other antibiotic agents status: Secondary | ICD-10-CM | POA: Diagnosis not present

## 2022-03-16 DIAGNOSIS — K760 Fatty (change of) liver, not elsewhere classified: Secondary | ICD-10-CM | POA: Diagnosis not present

## 2022-03-16 DIAGNOSIS — H9201 Otalgia, right ear: Secondary | ICD-10-CM | POA: Diagnosis not present

## 2022-03-16 DIAGNOSIS — Z856 Personal history of leukemia: Secondary | ICD-10-CM | POA: Diagnosis not present

## 2022-03-16 DIAGNOSIS — R110 Nausea: Secondary | ICD-10-CM | POA: Diagnosis not present

## 2022-03-16 DIAGNOSIS — R490 Dysphonia: Secondary | ICD-10-CM | POA: Diagnosis not present

## 2022-03-16 DIAGNOSIS — N83202 Unspecified ovarian cyst, left side: Secondary | ICD-10-CM | POA: Diagnosis not present

## 2022-03-16 DIAGNOSIS — D252 Subserosal leiomyoma of uterus: Secondary | ICD-10-CM | POA: Diagnosis not present

## 2022-03-18 ENCOUNTER — Inpatient Hospital Stay: Admission: RE | Admit: 2022-03-18 | Payer: BC Managed Care – PPO | Source: Ambulatory Visit

## 2022-03-18 ENCOUNTER — Other Ambulatory Visit: Payer: BC Managed Care – PPO

## 2022-03-19 DIAGNOSIS — M5416 Radiculopathy, lumbar region: Secondary | ICD-10-CM | POA: Diagnosis not present

## 2022-03-19 DIAGNOSIS — M546 Pain in thoracic spine: Secondary | ICD-10-CM | POA: Diagnosis not present

## 2022-03-19 DIAGNOSIS — R5383 Other fatigue: Secondary | ICD-10-CM | POA: Diagnosis not present

## 2022-03-19 DIAGNOSIS — M545 Low back pain, unspecified: Secondary | ICD-10-CM | POA: Diagnosis not present

## 2022-03-19 DIAGNOSIS — C9210 Chronic myeloid leukemia, BCR/ABL-positive, not having achieved remission: Secondary | ICD-10-CM | POA: Diagnosis not present

## 2022-03-19 DIAGNOSIS — R29898 Other symptoms and signs involving the musculoskeletal system: Secondary | ICD-10-CM | POA: Diagnosis not present

## 2022-03-19 DIAGNOSIS — M549 Dorsalgia, unspecified: Secondary | ICD-10-CM | POA: Diagnosis not present

## 2022-03-19 DIAGNOSIS — M6281 Muscle weakness (generalized): Secondary | ICD-10-CM | POA: Diagnosis not present

## 2022-03-19 DIAGNOSIS — R2689 Other abnormalities of gait and mobility: Secondary | ICD-10-CM | POA: Diagnosis not present

## 2022-03-20 ENCOUNTER — Ambulatory Visit
Admission: RE | Admit: 2022-03-20 | Discharge: 2022-03-20 | Disposition: A | Discharge status: Home / Self Care | Payer: BC Managed Care – PPO | Source: Ambulatory Visit | Attending: Adult Health | Admitting: Adult Health

## 2022-03-20 DIAGNOSIS — Q048 Other specified congenital malformations of brain: Secondary | ICD-10-CM | POA: Diagnosis not present

## 2022-03-20 DIAGNOSIS — G43109 Migraine with aura, not intractable, without status migrainosus: Secondary | ICD-10-CM | POA: Diagnosis not present

## 2022-03-20 DIAGNOSIS — J341 Cyst and mucocele of nose and nasal sinus: Secondary | ICD-10-CM | POA: Diagnosis not present

## 2022-03-20 IMAGING — CT CT BIOPSY
1 of 3 series · 15 of 28 positions shown, 19 images · non-contrast
Comparison: none

CLINICAL DATA: Recent diagnosis of chronic myeloid leukemia and
need for bone marrow biopsy for further evaluation.

[Series 2: i-spiral 5.0 br40 · axial · 0.89mm/px · z∈[+1020,+1111]mm · 15 of 30 slices shown, 19 images]
[im 2/30  mediastinal]
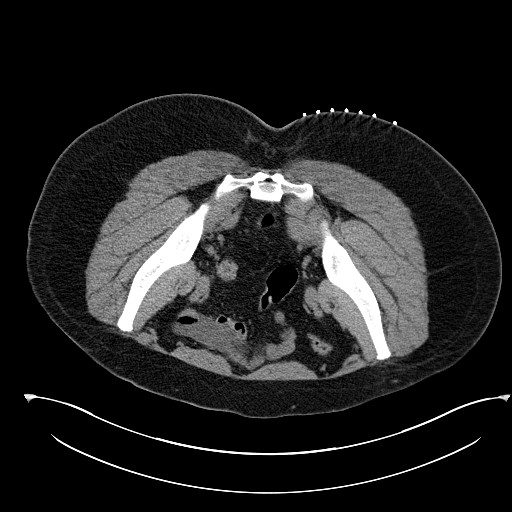
[im 2/30  lung]
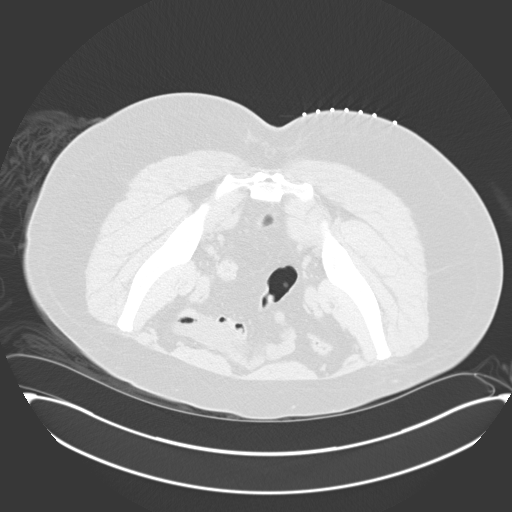
[im 5/30  lung]
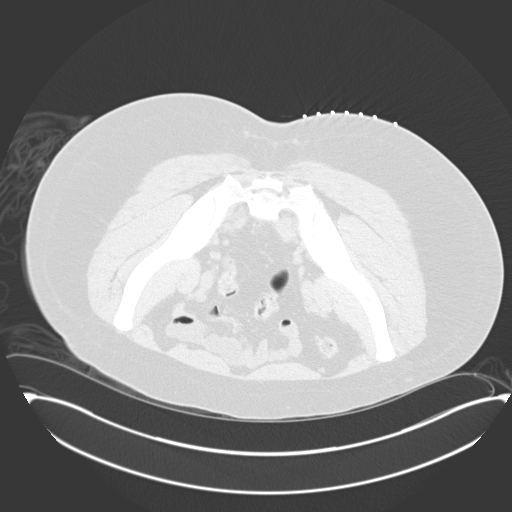
[im 6/30  lung]
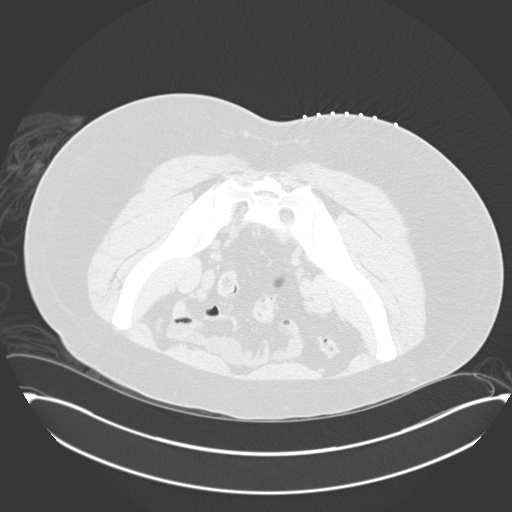
[im 7/30  lung]
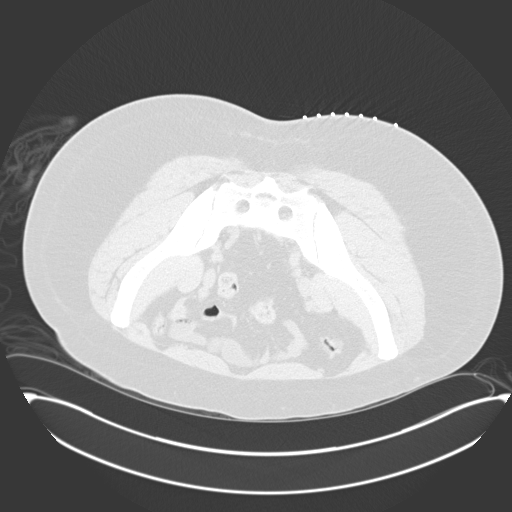
[im 10/30  mediastinal]
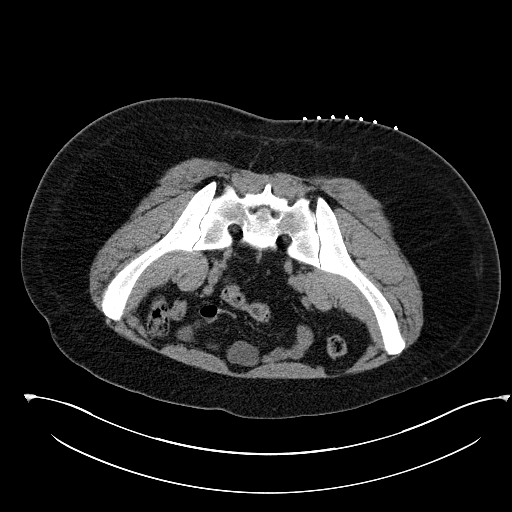
[im 10/30  lung]
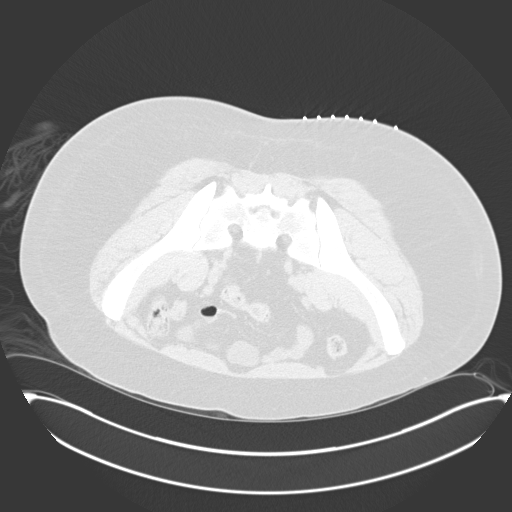
[im 12/30  lung]
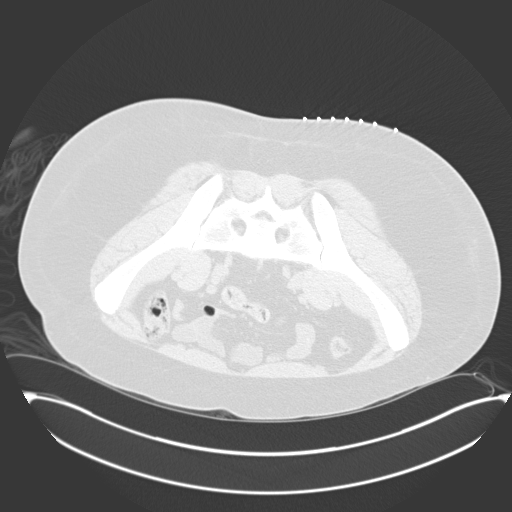
[im 13/30  lung]
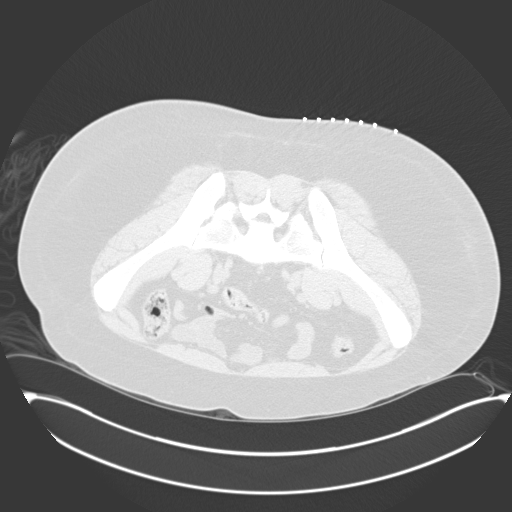
[im 16/30  lung]
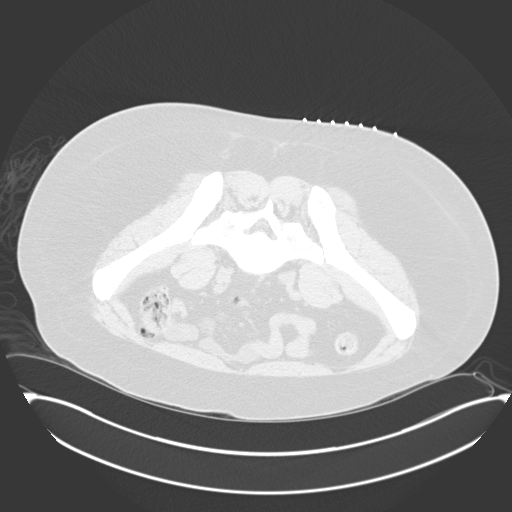
[im 17/30  mediastinal]
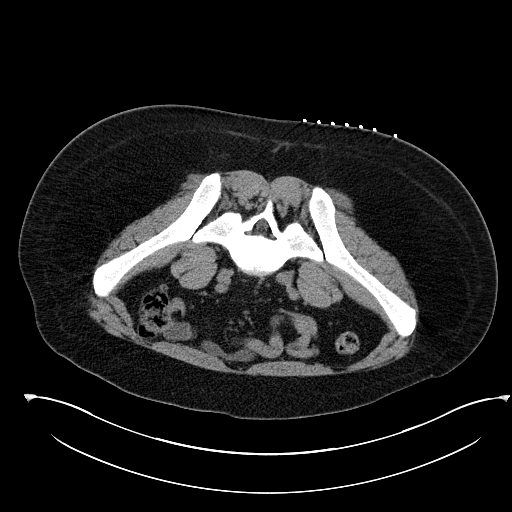
[im 17/30  lung]
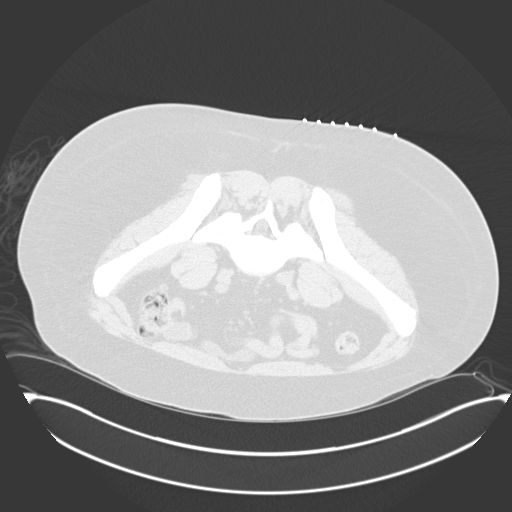
[im 18/30  lung]
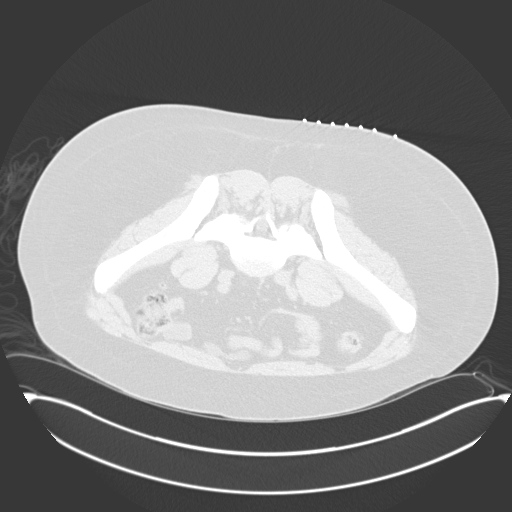
[im 21/30  lung]
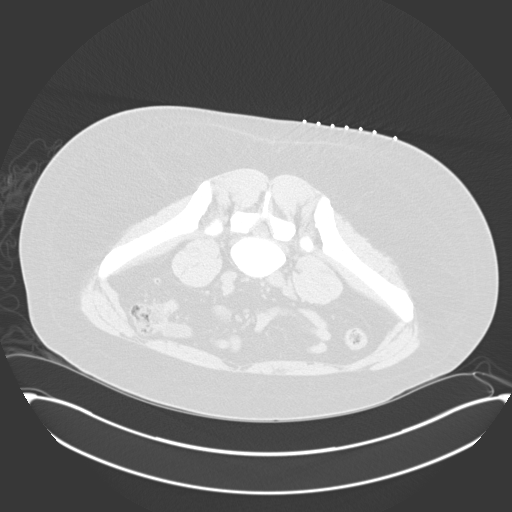
[im 23/30  lung]
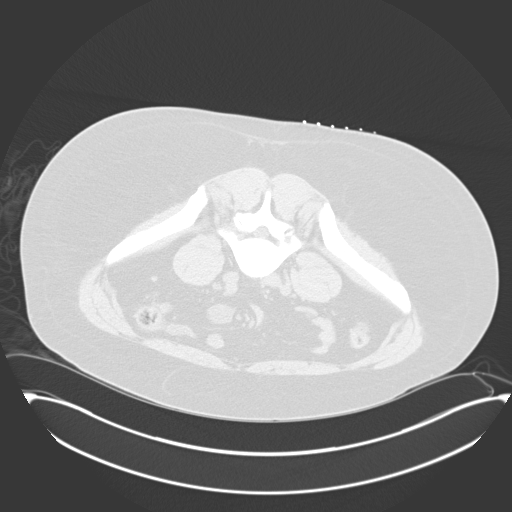
[im 24/30  mediastinal]
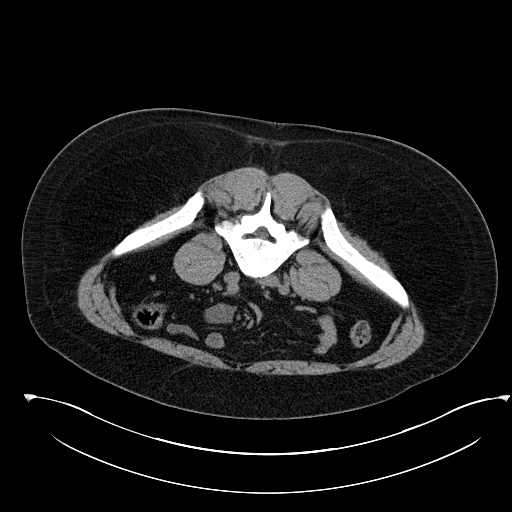
[im 24/30  lung]
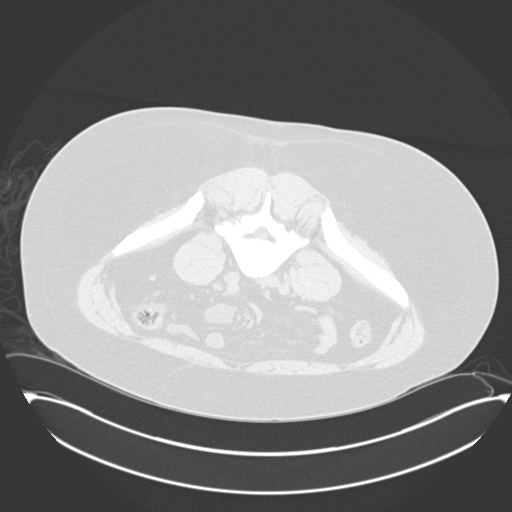
[im 27/30  lung]
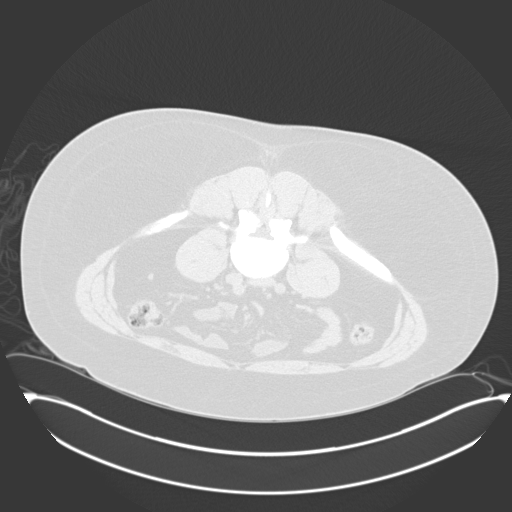
[im 28/30  lung]
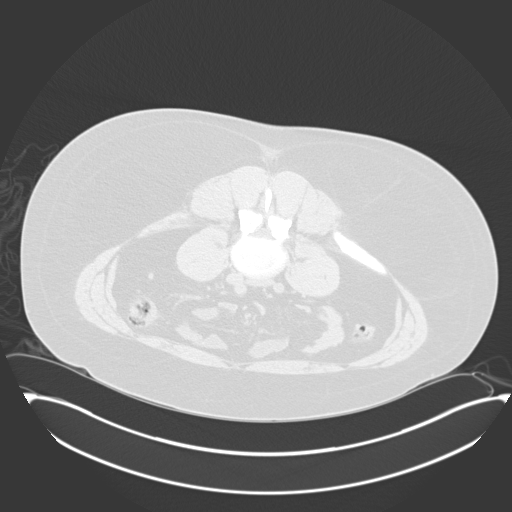

[15 of 28 positions shown; findings below may reference images not displayed]

EXAM:
CT GUIDED BONE MARROW ASPIRATION AND BIOPSY

ANESTHESIA/SEDATION:
Versed 4.0 mg IV, Fentanyl 100 mcg IV

Total Moderate Sedation Time:   20 minutes.

The patient's level of consciousness and physiologic status were
continuously monitored during the procedure by Radiology nursing.

PROCEDURE:
The procedure risks, benefits, and alternatives were explained to
the patient. Questions regarding the procedure were encouraged and
answered. The patient understands and consents to the procedure. A
time out was performed prior to initiating the procedure.

The left gluteal region was prepped with chlorhexidine. Sterile gown
and sterile gloves were used for the procedure. Local anesthesia was
provided with 1% Lidocaine.

Under CT guidance, an 11 gauge On Control bone cutting needle was
advanced from a posterior approach into the left iliac bone. Needle
positioning was confirmed with CT. Initial non heparinized and
heparinized aspirate samples were obtained of bone marrow. Core
biopsy was performed via the On Control drill needle.

COMPLICATIONS:
None
FINDINGS: Inspection of initial aspirate did reveal visible particles. Intact
core biopsy sample was obtained.
IMPRESSION: CT guided bone marrow biopsy of left posterior iliac bone with both
aspirate and core samples obtained.

## 2022-03-20 MED ORDER — GADOBENATE DIMEGLUMINE 529 MG/ML IV SOLN
20.0000 mL | Freq: Once | INTRAVENOUS | Status: AC | PRN
  Administered 2022-03-20: 20 mL via INTRAVENOUS

## 2022-03-22 ENCOUNTER — Other Ambulatory Visit: Payer: Self-pay | Admitting: Adult Health

## 2022-03-22 DIAGNOSIS — M5416 Radiculopathy, lumbar region: Secondary | ICD-10-CM

## 2022-03-22 DIAGNOSIS — R200 Anesthesia of skin: Secondary | ICD-10-CM

## 2022-03-22 DIAGNOSIS — R29898 Other symptoms and signs involving the musculoskeletal system: Secondary | ICD-10-CM

## 2022-03-23 ENCOUNTER — Ambulatory Visit
Admission: RE | Admit: 2022-03-23 | Discharge: 2022-03-23 | Disposition: A | Discharge status: Home / Self Care | Payer: BC Managed Care – PPO | Source: Ambulatory Visit | Attending: Adult Health | Admitting: Adult Health

## 2022-03-23 ENCOUNTER — Other Ambulatory Visit (HOSPITAL_COMMUNITY)
Admission: RE | Admit: 2022-03-23 | Discharge: 2022-03-23 | Disposition: A | Discharge status: Home / Self Care | Payer: BC Managed Care – PPO | Source: Ambulatory Visit | Attending: Obstetrics and Gynecology | Admitting: Obstetrics and Gynecology

## 2022-03-23 ENCOUNTER — Ambulatory Visit (INDEPENDENT_AMBULATORY_CARE_PROVIDER_SITE_OTHER): Payer: BC Managed Care – PPO | Admitting: Obstetrics and Gynecology

## 2022-03-23 ENCOUNTER — Encounter: Payer: Self-pay | Admitting: Obstetrics and Gynecology

## 2022-03-23 ENCOUNTER — Other Ambulatory Visit: Payer: Self-pay | Admitting: Adult Health

## 2022-03-23 DIAGNOSIS — M545 Low back pain, unspecified: Secondary | ICD-10-CM | POA: Diagnosis not present

## 2022-03-23 DIAGNOSIS — K625 Hemorrhage of anus and rectum: Secondary | ICD-10-CM

## 2022-03-23 DIAGNOSIS — M4724 Other spondylosis with radiculopathy, thoracic region: Secondary | ICD-10-CM

## 2022-03-23 DIAGNOSIS — R200 Anesthesia of skin: Secondary | ICD-10-CM | POA: Diagnosis not present

## 2022-03-23 DIAGNOSIS — M5416 Radiculopathy, lumbar region: Secondary | ICD-10-CM

## 2022-03-23 DIAGNOSIS — R29898 Other symptoms and signs involving the musculoskeletal system: Secondary | ICD-10-CM

## 2022-03-23 DIAGNOSIS — M549 Dorsalgia, unspecified: Secondary | ICD-10-CM

## 2022-03-23 DIAGNOSIS — C9210 Chronic myeloid leukemia, BCR/ABL-positive, not having achieved remission: Secondary | ICD-10-CM | POA: Diagnosis not present

## 2022-03-23 DIAGNOSIS — N946 Dysmenorrhea, unspecified: Secondary | ICD-10-CM | POA: Diagnosis not present

## 2022-03-23 DIAGNOSIS — N760 Acute vaginitis: Secondary | ICD-10-CM

## 2022-03-23 DIAGNOSIS — N3946 Mixed incontinence: Secondary | ICD-10-CM | POA: Diagnosis not present

## 2022-03-23 DIAGNOSIS — N398 Other specified disorders of urinary system: Secondary | ICD-10-CM | POA: Diagnosis not present

## 2022-03-23 LAB — URINALYSIS, COMPLETE W/RFL CULTURE
Bacteria, UA: NONE SEEN /HPF
Bilirubin Urine: NEGATIVE
Glucose, UA: NEGATIVE
Hgb urine dipstick: NEGATIVE
Hyaline Cast: NONE SEEN /LPF
Ketones, ur: NEGATIVE
Leukocyte Esterase: NEGATIVE
Nitrites, Initial: NEGATIVE
Protein, ur: NEGATIVE
RBC / HPF: NONE SEEN /HPF (ref 0–2)
Specific Gravity, Urine: 1.02 (ref 1.001–1.035)
WBC, UA: NONE SEEN /HPF (ref 0–5)
pH: 7 (ref 5.0–8.0)

## 2022-03-23 LAB — NO CULTURE INDICATED

## 2022-03-23 MED ORDER — FLUCONAZOLE 150 MG PO TABS: 150.0000 mg | ORAL_TABLET | Freq: Once | ORAL | 0 refills | Status: AC
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
## 2022-03-24 ENCOUNTER — Encounter: Payer: Self-pay | Admitting: Obstetrics and Gynecology

## 2022-03-24 DIAGNOSIS — Z6837 Body mass index (BMI) 37.0-37.9, adult: Secondary | ICD-10-CM | POA: Diagnosis not present

## 2022-03-24 DIAGNOSIS — K625 Hemorrhage of anus and rectum: Secondary | ICD-10-CM | POA: Diagnosis not present

## 2022-03-24 DIAGNOSIS — Z79899 Other long term (current) drug therapy: Secondary | ICD-10-CM | POA: Diagnosis not present

## 2022-03-24 DIAGNOSIS — G8929 Other chronic pain: Secondary | ICD-10-CM | POA: Diagnosis not present

## 2022-03-24 DIAGNOSIS — Z7951 Long term (current) use of inhaled steroids: Secondary | ICD-10-CM | POA: Diagnosis not present

## 2022-03-24 DIAGNOSIS — F419 Anxiety disorder, unspecified: Secondary | ICD-10-CM | POA: Diagnosis not present

## 2022-03-24 DIAGNOSIS — R109 Unspecified abdominal pain: Secondary | ICD-10-CM | POA: Diagnosis not present

## 2022-03-24 DIAGNOSIS — C9210 Chronic myeloid leukemia, BCR/ABL-positive, not having achieved remission: Secondary | ICD-10-CM | POA: Diagnosis not present

## 2022-03-24 DIAGNOSIS — R32 Unspecified urinary incontinence: Secondary | ICD-10-CM | POA: Diagnosis not present

## 2022-03-24 DIAGNOSIS — F900 Attention-deficit hyperactivity disorder, predominantly inattentive type: Secondary | ICD-10-CM | POA: Diagnosis not present

## 2022-03-24 DIAGNOSIS — R159 Full incontinence of feces: Secondary | ICD-10-CM | POA: Diagnosis not present

## 2022-03-24 DIAGNOSIS — J45909 Unspecified asthma, uncomplicated: Secondary | ICD-10-CM | POA: Diagnosis not present

## 2022-03-24 DIAGNOSIS — F32A Depression, unspecified: Secondary | ICD-10-CM | POA: Diagnosis not present

## 2022-03-24 DIAGNOSIS — Z8616 Personal history of COVID-19: Secondary | ICD-10-CM | POA: Diagnosis not present

## 2022-03-24 DIAGNOSIS — F4310 Post-traumatic stress disorder, unspecified: Secondary | ICD-10-CM | POA: Diagnosis not present

## 2022-03-24 LAB — CERVICOVAGINAL ANCILLARY ONLY
Bacterial Vaginitis (gardnerella): NEGATIVE
Candida Glabrata: NEGATIVE
Candida Vaginitis: NEGATIVE
Comment: NEGATIVE
Comment: NEGATIVE
Comment: NEGATIVE
Comment: NEGATIVE
Trichomonas: NEGATIVE

## 2022-03-24 LAB — HEMOGLOBIN A1C
Hgb A1c MFr Bld: 5 % of total Hgb (ref <5.7)
Mean Plasma Glucose: 97 mg/dL
eAG (mmol/L): 5.4 mmol/L

## 2022-03-25 NOTE — Telephone Encounter (Signed)
Please see the My Chart Message the patient sent on 03/24/22.  Make a referral to [facility] team in [place], [place]. Patient has dysmenorrhea and rectal bleeding with her periods, suspicious for endometriosis.   Also make a referral to Dr. Lyndel Safe at [facility] for a colonoscopy for rectal bleeding her her menses.

## 2022-03-26 DIAGNOSIS — C9210 Chronic myeloid leukemia, BCR/ABL-positive, not having achieved remission: Secondary | ICD-10-CM | POA: Diagnosis not present

## 2022-03-26 DIAGNOSIS — M6281 Muscle weakness (generalized): Secondary | ICD-10-CM | POA: Diagnosis not present

## 2022-03-26 DIAGNOSIS — M546 Pain in thoracic spine: Secondary | ICD-10-CM | POA: Diagnosis not present

## 2022-03-26 DIAGNOSIS — R29898 Other symptoms and signs involving the musculoskeletal system: Secondary | ICD-10-CM | POA: Diagnosis not present

## 2022-03-26 DIAGNOSIS — M545 Low back pain, unspecified: Secondary | ICD-10-CM | POA: Diagnosis not present

## 2022-03-26 DIAGNOSIS — R5383 Other fatigue: Secondary | ICD-10-CM | POA: Diagnosis not present

## 2022-03-26 DIAGNOSIS — M549 Dorsalgia, unspecified: Secondary | ICD-10-CM | POA: Diagnosis not present

## 2022-03-26 DIAGNOSIS — R2689 Other abnormalities of gait and mobility: Secondary | ICD-10-CM | POA: Diagnosis not present

## 2022-03-29 DIAGNOSIS — M549 Dorsalgia, unspecified: Secondary | ICD-10-CM | POA: Diagnosis not present

## 2022-03-29 DIAGNOSIS — R5383 Other fatigue: Secondary | ICD-10-CM | POA: Diagnosis not present

## 2022-03-29 DIAGNOSIS — M6281 Muscle weakness (generalized): Secondary | ICD-10-CM | POA: Diagnosis not present

## 2022-03-29 DIAGNOSIS — R2689 Other abnormalities of gait and mobility: Secondary | ICD-10-CM | POA: Diagnosis not present

## 2022-03-29 DIAGNOSIS — M545 Low back pain, unspecified: Secondary | ICD-10-CM | POA: Diagnosis not present

## 2022-03-29 DIAGNOSIS — R29898 Other symptoms and signs involving the musculoskeletal system: Secondary | ICD-10-CM | POA: Diagnosis not present

## 2022-03-29 DIAGNOSIS — M546 Pain in thoracic spine: Secondary | ICD-10-CM | POA: Diagnosis not present

## 2022-03-29 DIAGNOSIS — C9210 Chronic myeloid leukemia, BCR/ABL-positive, not having achieved remission: Secondary | ICD-10-CM | POA: Diagnosis not present
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
## 2022-03-31 ENCOUNTER — Telehealth: Payer: Self-pay | Admitting: Physical Therapy

## 2022-03-31 ENCOUNTER — Ambulatory Visit: Payer: BC Managed Care – PPO | Admitting: Physical Therapy

## 2022-03-31 DIAGNOSIS — Z6836 Body mass index (BMI) 36.0-36.9, adult: Secondary | ICD-10-CM | POA: Diagnosis not present

## 2022-03-31 DIAGNOSIS — M549 Dorsalgia, unspecified: Secondary | ICD-10-CM | POA: Diagnosis not present

## 2022-03-31 DIAGNOSIS — R52 Pain, unspecified: Secondary | ICD-10-CM | POA: Diagnosis not present

## 2022-03-31 DIAGNOSIS — G894 Chronic pain syndrome: Secondary | ICD-10-CM | POA: Diagnosis not present

## 2022-03-31 NOTE — Telephone Encounter (Signed)
Called and spoke to patient.  She reports she had something else scheduled today but did not have this appointment in her calendar. She confirmed her next appointment  Gustavus Bryant, PT 03/31/22 9:07 AM

## 2022-04-01 ENCOUNTER — Ambulatory Visit: Payer: BC Managed Care – PPO | Admitting: Physical Therapy

## 2022-04-01 ENCOUNTER — Ambulatory Visit (INDEPENDENT_AMBULATORY_CARE_PROVIDER_SITE_OTHER): Payer: BC Managed Care – PPO | Admitting: Radiology

## 2022-04-01 VITALS — BP 116/78

## 2022-04-01 DIAGNOSIS — B354 Tinea corporis: Secondary | ICD-10-CM | POA: Diagnosis not present

## 2022-04-01 MED ORDER — CLOTRIMAZOLE-BETAMETHASONE 1-0.05 % EX CREA: 1.0000 | TOPICAL_CREAM | Freq: Two times a day (BID) | CUTANEOUS | 0 refills | Status: DC

## 2022-04-01 NOTE — Progress Notes (Addendum)
   Desiree Carlson 09-21-1995 [id number]   History:  26 y.o. G0 presents with complaints of skin changes on the right breast x 1 week.  She also reports chest pain and upper back pain (not new, PCP and PT addressing). 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   Gynecologic History Patient's last menstrual period was 03/18/2022 (exact date).   Contraception/Family planning: abstinence   Obstetric History OB History  Gravida Para Term Preterm AB Living  0 0          SAB IAB Ectopic Multiple Live Births                The following portions of the patient's history were reviewed and updated as appropriate: allergies, current medications, past family history, past medical history, past social history, past surgical history, and problem list.  Review of Systems Pertinent items noted in HPI and remainder of comprehensive ROS otherwise negative.   Past medical history, past surgical history, family history and social history were all reviewed and documented in the EPIC chart.   Exam:  Vitals:   04/01/22 1101  BP: 116/78   There is no height or weight on file to calculate BMI.  General appearance:  Normal Breasts: left breast normal without mass, skin or nipple changes or axillary nodes, right breast no masses, there is a ovoid erythematous scaling patch at 11 oclock about 2.5cm in diameter  Patient informed chaperone available to be present for breast exam. Patient has requested no chaperone to be present.   Assessment/Plan:   1. Tinea corporis  - clotrimazole-betamethasone (LOTRISONE) cream; Apply 1 Application topically 2 (two) times daily.  Dispense: 30 g; Refill: 0    [name] B WHNP-BC 11:32 AM 04/01/2022

## 2022-04-02 ENCOUNTER — Ambulatory Visit: Payer: BC Managed Care – PPO | Attending: Student | Admitting: Physical Therapy

## 2022-04-02 ENCOUNTER — Emergency Department (HOSPITAL_COMMUNITY): Payer: BC Managed Care – PPO

## 2022-04-02 ENCOUNTER — Emergency Department (HOSPITAL_COMMUNITY)
Admission: EM | Admit: 2022-04-02 | Discharge: 2022-04-02 | Disposition: A | Discharge status: Home / Self Care | Payer: BC Managed Care – PPO | Attending: Emergency Medicine | Admitting: Emergency Medicine

## 2022-04-02 ENCOUNTER — Other Ambulatory Visit: Payer: Self-pay

## 2022-04-02 ENCOUNTER — Encounter: Payer: Self-pay | Admitting: Physical Therapy

## 2022-04-02 DIAGNOSIS — R590 Localized enlarged lymph nodes: Secondary | ICD-10-CM | POA: Diagnosis not present

## 2022-04-02 DIAGNOSIS — M549 Dorsalgia, unspecified: Secondary | ICD-10-CM | POA: Insufficient documentation

## 2022-04-02 DIAGNOSIS — R079 Chest pain, unspecified: Secondary | ICD-10-CM | POA: Insufficient documentation

## 2022-04-02 DIAGNOSIS — R519 Headache, unspecified: Secondary | ICD-10-CM | POA: Insufficient documentation

## 2022-04-02 DIAGNOSIS — Q048 Other specified congenital malformations of brain: Secondary | ICD-10-CM | POA: Diagnosis not present

## 2022-04-02 DIAGNOSIS — M6281 Muscle weakness (generalized): Secondary | ICD-10-CM | POA: Diagnosis present

## 2022-04-02 DIAGNOSIS — Q283 Other malformations of cerebral vessels: Secondary | ICD-10-CM | POA: Diagnosis not present

## 2022-04-02 DIAGNOSIS — R112 Nausea with vomiting, unspecified: Secondary | ICD-10-CM | POA: Diagnosis not present

## 2022-04-02 DIAGNOSIS — J45909 Unspecified asthma, uncomplicated: Secondary | ICD-10-CM | POA: Insufficient documentation

## 2022-04-02 DIAGNOSIS — U071 COVID-19: Secondary | ICD-10-CM

## 2022-04-02 DIAGNOSIS — J341 Cyst and mucocele of nose and nasal sinus: Secondary | ICD-10-CM | POA: Diagnosis not present

## 2022-04-02 DIAGNOSIS — R109 Unspecified abdominal pain: Secondary | ICD-10-CM | POA: Diagnosis not present

## 2022-04-02 DIAGNOSIS — M62838 Other muscle spasm: Secondary | ICD-10-CM | POA: Diagnosis present

## 2022-04-02 DIAGNOSIS — R5383 Other fatigue: Secondary | ICD-10-CM | POA: Diagnosis not present

## 2022-04-02 DIAGNOSIS — Z856 Personal history of leukemia: Secondary | ICD-10-CM | POA: Insufficient documentation

## 2022-04-02 HISTORY — DX: COVID-19: U07.1

## 2022-04-02 LAB — BASIC METABOLIC PANEL
Anion gap: 15 (ref 5–15)
BUN: 10 mg/dL (ref 6–20)
CO2: 21 mmol/L — ABNORMAL LOW (ref 22–32)
Calcium: 9.7 mg/dL (ref 8.9–10.3)
Chloride: 103 mmol/L (ref 98–111)
Creatinine, Ser: 1.05 mg/dL — ABNORMAL HIGH (ref 0.44–1.00)
GFR, Estimated: >60 mL/min (ref >60)
Glucose, Bld: 116 mg/dL — ABNORMAL HIGH (ref 70–99)
Potassium: 3.7 mmol/L (ref 3.5–5.1)
Sodium: 139 mmol/L (ref 135–145)

## 2022-04-02 LAB — CBC WITH DIFFERENTIAL/PLATELET
Abs Immature Granulocytes: 0.04 10*3/uL (ref 0.00–0.07)
Basophils Absolute: 0.1 10*3/uL (ref 0.0–0.1)
Basophils Relative: 1 %
Eosinophils Absolute: 0.1 10*3/uL (ref 0.0–0.5)
Eosinophils Relative: 1 %
HCT: 43.3 % (ref 36.0–46.0)
Hemoglobin: 14.1 g/dL (ref 12.0–15.0)
Immature Granulocytes: 0 %
Lymphocytes Relative: 22 %
Lymphs Abs: 2.9 10*3/uL (ref 0.7–4.0)
MCH: 27.9 pg (ref 26.0–34.0)
MCHC: 32.6 g/dL (ref 30.0–36.0)
MCV: 85.7 fL (ref 80.0–100.0)
Monocytes Absolute: 0.8 10*3/uL (ref 0.1–1.0)
Monocytes Relative: 6 %
Neutro Abs: 9.4 10*3/uL — ABNORMAL HIGH (ref 1.7–7.7)
Neutrophils Relative %: 70 %
Platelets: 386 10*3/uL (ref 150–400)
RBC: 5.05 MIL/uL (ref 3.87–5.11)
RDW: 12.6 % (ref 11.5–15.5)
WBC: 13.2 10*3/uL — ABNORMAL HIGH (ref 4.0–10.5)
nRBC: 0 % (ref 0.0–0.2)

## 2022-04-02 LAB — HEPATIC FUNCTION PANEL
ALT: 44 U/L (ref 0–44)
AST: 39 U/L (ref 15–41)
Albumin: 4 g/dL (ref 3.5–5.0)
Alkaline Phosphatase: 82 U/L (ref 38–126)
Bilirubin, Direct: 0.2 mg/dL (ref 0.0–0.2)
Indirect Bilirubin: 0.4 mg/dL (ref 0.3–0.9)
Total Bilirubin: 0.6 mg/dL (ref 0.3–1.2)
Total Protein: 7.4 g/dL (ref 6.5–8.1)

## 2022-04-02 LAB — TROPONIN I (HIGH SENSITIVITY): Troponin I (High Sensitivity): 7 ng/L (ref <18)

## 2022-04-02 LAB — I-STAT BETA HCG BLOOD, ED (MC, WL, AP ONLY): I-stat hCG, quantitative: <5 m[IU]/mL (ref <5)

## 2022-04-02 LAB — LIPASE, BLOOD: Lipase: 33 U/L (ref 11–51)

## 2022-04-02 MED ORDER — PROCHLORPERAZINE EDISYLATE 10 MG/2ML IJ SOLN
10.0000 mg | Freq: Once | INTRAMUSCULAR | Status: AC
  Administered 2022-04-02: 10 mg via INTRAVENOUS
  Filled 2022-04-02: qty 2

## 2022-04-02 MED ORDER — IOHEXOL 350 MG/ML SOLN
75.0000 mL | Freq: Once | INTRAVENOUS | Status: AC | PRN
Start: 2022-04-02 — End: 2022-04-02
  Administered 2022-04-02: 75 mL via INTRAVENOUS

## 2022-04-02 MED ORDER — SODIUM CHLORIDE 0.9 % IV BOLUS
1000.0000 mL | Freq: Once | INTRAVENOUS | Status: AC
  Administered 2022-04-02: 1000 mL via INTRAVENOUS

## 2022-04-02 MED ORDER — ONDANSETRON 4 MG PO TBDP
4.0000 mg | ORAL_TABLET | Freq: Once | ORAL | Status: AC
Start: 2022-04-02 — End: 2022-04-02
  Administered 2022-04-02: 4 mg via ORAL
  Filled 2022-04-02: qty 1

## 2022-04-02 MED ORDER — DEXAMETHASONE SODIUM PHOSPHATE 10 MG/ML IJ SOLN
10.0000 mg | Freq: Once | INTRAMUSCULAR | Status: AC
  Administered 2022-04-02: 10 mg via INTRAVENOUS
  Filled 2022-04-02: qty 1

## 2022-04-02 MED ORDER — BUTALBITAL-APAP-CAFFEINE 50-325-40 MG PO TABS: 1.0000 | ORAL_TABLET | Freq: Four times a day (QID) | ORAL | 0 refills | Status: DC | PRN
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
## 2022-04-02 NOTE — ED Provider Notes (Signed)
Patient signed out to me at 3 PM.  Here mostly for headache.  Headache cocktail has improved her headache.  Her lab work is been unremarkable.  She is awaiting for a CT scan of her head.  CT scan is without any acute findings.  I gave her a dose of Decadron to further help with headache.  Suspect that she has chronic migraines.  We will refer her to neurology.  Will prescribe Fioricet for any breakthrough headaches but recommend Tylenol and ibuprofen first.  Neurologically she is intact.  She was ambulatory to the bathroom without any issues.  Discharged in good condition.  This chart was dictated using voice recognition software.  Despite best efforts to proofread,  errors can occur which can change the documentation meaning.    Lennice Sites, DO 04/02/22 1644

## 2022-04-02 NOTE — ED Notes (Signed)
Patient also reports she has endometrosis and blood in her urine.  Patient ambulatory to bathroom with steady gait.

## 2022-04-02 NOTE — ED Triage Notes (Signed)
Pt. Stated, Im having the worse headache before and Ive had chronic headaches but this is the worse. I had a MRI on Saturday. I had a spinal MRI on Monday at [facility] imaging. My lymph nodes are swollen at my throat. My back, and stomach also hurts. I also have 2 little wound, sores on her left foot that are not healing. Im always getting sick.

## 2022-04-02 NOTE — ED Notes (Signed)
Pt verbalized understanding of d/c instructions, meds, and followup care. Denies questions. VSS, no distress noted. Steady gait to exit with all belongings.  ?

## 2022-04-06 ENCOUNTER — Encounter: Payer: Self-pay | Admitting: Physical Therapy

## 2022-04-06 ENCOUNTER — Ambulatory Visit: Payer: BC Managed Care – PPO | Admitting: Physical Therapy

## 2022-04-06 DIAGNOSIS — M62838 Other muscle spasm: Secondary | ICD-10-CM

## 2022-04-06 DIAGNOSIS — M6281 Muscle weakness (generalized): Secondary | ICD-10-CM
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
## 2022-04-07 ENCOUNTER — Encounter: Payer: Self-pay | Admitting: Psychiatry

## 2022-04-07 ENCOUNTER — Ambulatory Visit (INDEPENDENT_AMBULATORY_CARE_PROVIDER_SITE_OTHER): Payer: BC Managed Care – PPO | Admitting: Psychiatry

## 2022-04-07 ENCOUNTER — Telehealth: Payer: Self-pay | Admitting: *Deleted

## 2022-04-07 VITALS — BP 106/70 | HR 67 | Ht 67.0 in | Wt 237.0 lb

## 2022-04-07 DIAGNOSIS — C9210 Chronic myeloid leukemia, BCR/ABL-positive, not having achieved remission: Secondary | ICD-10-CM | POA: Diagnosis not present

## 2022-04-07 DIAGNOSIS — G8929 Other chronic pain: Secondary | ICD-10-CM | POA: Diagnosis not present

## 2022-04-07 DIAGNOSIS — M546 Pain in thoracic spine: Secondary | ICD-10-CM | POA: Diagnosis not present

## 2022-04-07 DIAGNOSIS — R1310 Dysphagia, unspecified: Secondary | ICD-10-CM | POA: Diagnosis not present

## 2022-04-07 DIAGNOSIS — M545 Low back pain, unspecified: Secondary | ICD-10-CM | POA: Diagnosis not present

## 2022-04-07 DIAGNOSIS — G43719 Chronic migraine without aura, intractable, without status migrainosus: Secondary | ICD-10-CM

## 2022-04-07 DIAGNOSIS — R2689 Other abnormalities of gait and mobility: Secondary | ICD-10-CM | POA: Diagnosis not present

## 2022-04-07 DIAGNOSIS — R29898 Other symptoms and signs involving the musculoskeletal system: Secondary | ICD-10-CM | POA: Diagnosis not present

## 2022-04-07 DIAGNOSIS — M549 Dorsalgia, unspecified: Secondary | ICD-10-CM | POA: Diagnosis not present

## 2022-04-07 DIAGNOSIS — R5383 Other fatigue: Secondary | ICD-10-CM | POA: Diagnosis not present

## 2022-04-07 DIAGNOSIS — M6281 Muscle weakness (generalized): Secondary | ICD-10-CM | POA: Diagnosis not present

## 2022-04-07 MED ORDER — SUMATRIPTAN SUCCINATE 50 MG PO TABS: 50.0000 mg | ORAL_TABLET | ORAL | 6 refills | Status: DC | PRN
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
## 2022-04-07 NOTE — Telephone Encounter (Signed)
Called provider line, [employer] was on hold 20 minutes, will call later.

## 2022-04-07 NOTE — Patient Instructions (Signed)
Start Botox for migraine prevention  Start Imitrex (sumatriptan) as needed for migraines. Take at the onset of migraine. If headache recurs or does not fully resolve, you may take a second dose after 2 hours. Please avoid taking more than 2 days per week

## 2022-04-07 NOTE — Telephone Encounter (Signed)
Found [employer] form for medical injectables online. Printed, on MD desk for review, completion, signature.

## 2022-04-08 ENCOUNTER — Telehealth: Payer: Self-pay | Admitting: Psychiatry

## 2022-04-08 ENCOUNTER — Encounter: Payer: Self-pay | Admitting: *Deleted

## 2022-04-08 ENCOUNTER — Encounter: Payer: Self-pay | Admitting: Physical Therapy

## 2022-04-08 ENCOUNTER — Encounter: Payer: Self-pay | Admitting: Obstetrics and Gynecology

## 2022-04-08 ENCOUNTER — Ambulatory Visit: Payer: BC Managed Care – PPO | Admitting: Physical Therapy

## 2022-04-08 DIAGNOSIS — M2550 Pain in unspecified joint: Secondary | ICD-10-CM | POA: Diagnosis not present

## 2022-04-08 DIAGNOSIS — M62838 Other muscle spasm: Secondary | ICD-10-CM

## 2022-04-08 DIAGNOSIS — R300 Dysuria: Secondary | ICD-10-CM | POA: Diagnosis not present

## 2022-04-08 DIAGNOSIS — M6281 Muscle weakness (generalized): Secondary | ICD-10-CM

## 2022-04-08 DIAGNOSIS — R490 Dysphonia: Secondary | ICD-10-CM | POA: Diagnosis not present

## 2022-04-08 DIAGNOSIS — Z6836 Body mass index (BMI) 36.0-36.9, adult: Secondary | ICD-10-CM | POA: Diagnosis not present

## 2022-04-08 MED ORDER — BOTOX 200 UNITS IJ SOLR: 200.0000 [IU] | INTRAMUSCULAR | 11 refills | Status: DC

## 2022-04-08 NOTE — Telephone Encounter (Signed)
I called and spoke with patient. She opted for noon tomorrow as she has an appt today at 3pm with MD at [facility].  Instructed to check in by 11:45am.

## 2022-04-08 NOTE — Telephone Encounter (Signed)
Options for a visit are to work her in at 3:15 today or tomorrow at 12:00.

## 2022-04-08 NOTE — Telephone Encounter (Signed)
Referral sent to [facility] [phone]

## 2022-04-08 NOTE — Addendum Note (Signed)
Addended by: Genia Harold on: 04/08/2022 01:25 PM   Modules accepted: Orders

## 2022-04-08 NOTE — Telephone Encounter (Signed)
Faxed [employer] approval letter to [employer]. Received confirmation.
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
## 2022-04-08 NOTE — Telephone Encounter (Signed)
[employer] PA form completed, signed. Faxed form with office notes, received confirmation.

## 2022-04-08 NOTE — Telephone Encounter (Signed)
Received fax from [employer] re: Botox 200 unit vial approved 04/08/22 to 10/05/22, ref #[id number]. Called [address] to get specialty pharmacy, spoke with Pieter Partridge who stated she has dedicated team ph [phone], he transferred call to customer service. Spoke with April who stated  I was transferred to wrong #. She advised I call # on back of insurance card. Called prescription drug #, reached [facility], spoke with [facility] who stated to send Rx to [place], [place], f [phone].

## 2022-04-08 NOTE — Telephone Encounter (Signed)
Rx sent, thanks 

## 2022-04-08 NOTE — Telephone Encounter (Signed)
Referral sent to [facility] [phone]

## 2022-04-09 ENCOUNTER — Ambulatory Visit (INDEPENDENT_AMBULATORY_CARE_PROVIDER_SITE_OTHER): Payer: BC Managed Care – PPO | Admitting: Obstetrics and Gynecology

## 2022-04-09 ENCOUNTER — Other Ambulatory Visit (HOSPITAL_COMMUNITY)
Admission: RE | Admit: 2022-04-09 | Discharge: 2022-04-09 | Disposition: A | Discharge status: Home / Self Care | Payer: BC Managed Care – PPO | Source: Ambulatory Visit | Attending: Obstetrics and Gynecology | Admitting: Obstetrics and Gynecology

## 2022-04-09 ENCOUNTER — Encounter: Payer: Self-pay | Admitting: Obstetrics and Gynecology

## 2022-04-09 VITALS — BP 110/64 | HR 83 | Ht 68.0 in | Wt 262.0 lb

## 2022-04-09 DIAGNOSIS — F439 Reaction to severe stress, unspecified: Secondary | ICD-10-CM

## 2022-04-09 DIAGNOSIS — N763 Subacute and chronic vulvitis: Secondary | ICD-10-CM

## 2022-04-09 DIAGNOSIS — R300 Dysuria: Secondary | ICD-10-CM

## 2022-04-09 DIAGNOSIS — N766 Ulceration of vulva: Secondary | ICD-10-CM | POA: Diagnosis not present

## 2022-04-09 LAB — URINALYSIS, COMPLETE W/RFL CULTURE
Bacteria, UA: NONE SEEN /HPF
Bilirubin Urine: NEGATIVE
Casts: NONE SEEN /LPF
Crystals: NONE SEEN /HPF
Glucose, UA: NEGATIVE
Hgb urine dipstick: NEGATIVE
Hyaline Cast: NONE SEEN /LPF
Ketones, ur: NEGATIVE
Leukocyte Esterase: NEGATIVE
Nitrites, Initial: NEGATIVE
Protein, ur: NEGATIVE
RBC / HPF: NONE SEEN /HPF (ref 0–2)
Specific Gravity, Urine: 1.015 (ref 1.001–1.035)
WBC, UA: NONE SEEN /HPF (ref 0–5)
Yeast: NONE SEEN /HPF
pH: 7 (ref 5.0–8.0)

## 2022-04-09 LAB — NO CULTURE INDICATED

## 2022-04-09 MED ORDER — FLUCONAZOLE 150 MG PO TABS: 150.0000 mg | ORAL_TABLET | Freq: Once | ORAL | 0 refills | Status: AC

## 2022-04-09 MED ORDER — LIDOCAINE 5 % EX OINT: 1.0000 | TOPICAL_OINTMENT | Freq: Three times a day (TID) | CUTANEOUS | 1 refills | Status: DC

## 2022-04-09 MED ORDER — NYSTATIN-TRIAMCINOLONE 100000-0.1 UNIT/GM-% EX OINT: 1.0000 | TOPICAL_OINTMENT | Freq: Two times a day (BID) | CUTANEOUS | 0 refills | Status: DC

## 2022-04-09 MED ORDER — VALACYCLOVIR HCL 500 MG PO TABS: ORAL_TABLET | ORAL | 0 refills | Status: DC
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
## 2022-04-11 LAB — SURESWAB HSV, TYPE 1/2 DNA, PCR
HSV 1 DNA: NOT DETECTED
HSV 2 DNA: NOT DETECTED

## 2022-04-12 ENCOUNTER — Ambulatory Visit: Payer: BC Managed Care – PPO | Admitting: Physical Therapy

## 2022-04-12 ENCOUNTER — Telehealth: Payer: Self-pay | Admitting: Internal Medicine

## 2022-04-12 DIAGNOSIS — M62838 Other muscle spasm: Secondary | ICD-10-CM

## 2022-04-12 DIAGNOSIS — M6281 Muscle weakness (generalized): Secondary | ICD-10-CM

## 2022-04-12 LAB — CERVICOVAGINAL ANCILLARY ONLY
Bacterial Vaginitis (gardnerella): NEGATIVE
Candida Glabrata: NEGATIVE
Candida Vaginitis: NEGATIVE
Comment: NEGATIVE
Comment: NEGATIVE
Comment: NEGATIVE
Comment: NEGATIVE
Trichomonas: NEGATIVE

## 2022-04-12 NOTE — Telephone Encounter (Signed)
Yes, I can accept.

## 2022-04-12 NOTE — Telephone Encounter (Signed)
Dr Delfin Edis called and said that she knows Dr Quay Burow and said she would like to refer patient and wanted to know if Dr Quay Burow would be able to take patient on. She said patient will call us to check on this.
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
## 2022-04-13 NOTE — Telephone Encounter (Signed)
My-chart sent to patient  to contact me with availability.  If she calls back please schedule first 11:00 am appointment.

## 2022-04-14 NOTE — Telephone Encounter (Addendum)
Received fax from [place] re: Rx clarification. Completed, signed by Dr Billey Gosling, faxed to [place]. Received confirmation.

## 2022-04-15 ENCOUNTER — Ambulatory Visit: Payer: BC Managed Care – PPO | Admitting: Physical Therapy

## 2022-04-15 ENCOUNTER — Encounter: Payer: Self-pay | Admitting: Obstetrics and Gynecology

## 2022-04-15 DIAGNOSIS — M62838 Other muscle spasm: Secondary | ICD-10-CM

## 2022-04-15 DIAGNOSIS — R29898 Other symptoms and signs involving the musculoskeletal system: Secondary | ICD-10-CM | POA: Diagnosis not present

## 2022-04-15 DIAGNOSIS — M545 Low back pain, unspecified: Secondary | ICD-10-CM | POA: Diagnosis not present

## 2022-04-15 DIAGNOSIS — C9210 Chronic myeloid leukemia, BCR/ABL-positive, not having achieved remission: Secondary | ICD-10-CM | POA: Diagnosis not present

## 2022-04-15 DIAGNOSIS — M6281 Muscle weakness (generalized): Secondary | ICD-10-CM | POA: Diagnosis not present

## 2022-04-15 DIAGNOSIS — M549 Dorsalgia, unspecified: Secondary | ICD-10-CM | POA: Diagnosis not present

## 2022-04-15 DIAGNOSIS — M546 Pain in thoracic spine: Secondary | ICD-10-CM | POA: Diagnosis not present

## 2022-04-15 DIAGNOSIS — R5383 Other fatigue: Secondary | ICD-10-CM | POA: Diagnosis not present

## 2022-04-15 DIAGNOSIS — R2689 Other abnormalities of gait and mobility: Secondary | ICD-10-CM | POA: Diagnosis not present
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
## 2022-04-16 DIAGNOSIS — M2550 Pain in unspecified joint: Secondary | ICD-10-CM | POA: Diagnosis not present

## 2022-04-16 DIAGNOSIS — L509 Urticaria, unspecified: Secondary | ICD-10-CM | POA: Diagnosis not present

## 2022-04-16 DIAGNOSIS — C9210 Chronic myeloid leukemia, BCR/ABL-positive, not having achieved remission: Secondary | ICD-10-CM | POA: Diagnosis not present

## 2022-04-16 DIAGNOSIS — Z6836 Body mass index (BMI) 36.0-36.9, adult: Secondary | ICD-10-CM | POA: Diagnosis not present
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
## 2022-04-19 ENCOUNTER — Ambulatory Visit: Payer: BC Managed Care – PPO | Admitting: Physical Therapy

## 2022-04-19 DIAGNOSIS — M549 Dorsalgia, unspecified: Secondary | ICD-10-CM | POA: Diagnosis not present

## 2022-04-19 DIAGNOSIS — M546 Pain in thoracic spine: Secondary | ICD-10-CM | POA: Diagnosis not present

## 2022-04-19 DIAGNOSIS — R29898 Other symptoms and signs involving the musculoskeletal system: Secondary | ICD-10-CM | POA: Diagnosis not present

## 2022-04-19 DIAGNOSIS — R2689 Other abnormalities of gait and mobility: Secondary | ICD-10-CM | POA: Diagnosis not present

## 2022-04-19 DIAGNOSIS — M62838 Other muscle spasm: Secondary | ICD-10-CM | POA: Diagnosis not present

## 2022-04-19 DIAGNOSIS — M6281 Muscle weakness (generalized): Secondary | ICD-10-CM

## 2022-04-19 DIAGNOSIS — C9210 Chronic myeloid leukemia, BCR/ABL-positive, not having achieved remission: Secondary | ICD-10-CM | POA: Diagnosis not present

## 2022-04-19 DIAGNOSIS — R5383 Other fatigue: Secondary | ICD-10-CM | POA: Diagnosis not present

## 2022-04-19 DIAGNOSIS — M545 Low back pain, unspecified: Secondary | ICD-10-CM | POA: Diagnosis not present

## 2022-04-20 DIAGNOSIS — G43719 Chronic migraine without aura, intractable, without status migrainosus: Secondary | ICD-10-CM | POA: Diagnosis not present

## 2022-04-20 DIAGNOSIS — R69 Illness, unspecified: Secondary | ICD-10-CM | POA: Diagnosis not present

## 2022-04-21 ENCOUNTER — Telehealth: Payer: Self-pay | Admitting: Psychiatry

## 2022-04-21 NOTE — Telephone Encounter (Signed)
Botox 200 units delivered today. Can you guys please call and schedule her an appointment with Dr. Billey Gosling. Thanks!

## 2022-04-21 NOTE — Telephone Encounter (Signed)
LVM and sent MyChart asking pt to call back and schedule Botox appt.

## 2022-04-26 ENCOUNTER — Encounter: Payer: Self-pay | Admitting: Internal Medicine
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
## 2022-04-26 NOTE — Progress Notes (Unsigned)
Subjective:    Patient ID: Desiree Carlson, female    DOB: [birth year], 26 y.o.   MRN: [id number]     HPI She is here to establish with a new pcp.  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  Off for two weeks I 03/2022 due to ? Side effects - restartedin the end of august.   Chronic pain, chronic back pain - [facility] pain management -   Medications and allergies reviewed with patient and updated if appropriate.  Current Outpatient Medications on File Prior to Visit  Medication Sig Dispense Refill   ALPRAZolam (XANAX) 0.5 MG tablet TAKE 1 TABLET BY MOUTH TWICE DAILY AS NEEDED FOR ANXIETY FOR 30 DAYS     amphetamine-dextroamphetamine (ADDERALL XR) 30 MG 24 hr capsule Take 1 capsule by mouth every morning.     botulinum toxin Type A (BOTOX) 200 units injection Inject 200 Units into the muscle every 3 (three) months. 1 each 11   budesonide-formoterol (SYMBICORT) 160-4.5 MCG/ACT inhaler Inhale into the lungs as needed.     butalbital-acetaminophen-caffeine (FIORICET) 50-325-40 MG tablet Take 1 tablet by mouth every 6 (six) hours as needed for up to 20 doses for headache. 20 tablet 0   cimetidine (TAGAMET) 400 MG tablet Take 400 mg by mouth at bedtime.     clotrimazole-betamethasone (LOTRISONE) cream Apply 1 Application topically 2 (two) times daily. (Patient taking differently: Apply 1 Application topically as needed. As needed) 30 g 0   cyclobenzaprine (FLEXERIL) 5 MG tablet Take 1 tablet (5 mg total) by mouth nightly as needed for back pain.     dasatinib (SPRYCEL) 70 MG tablet 1 tablet - PER [facility] Orally Once a day     diclofenac Sodium (VOLTAREN) 1 % GEL as needed.     fluticasone (FLONASE) 50 MCG/ACT nasal spray 1 spray into each nostril daily.     ibuprofen (ADVIL) 200 MG tablet Take by mouth as needed.     ibuprofen (ADVIL) 200 MG tablet Take by mouth.     ketoconazole (NIZORAL) 2 % cream SMARTSIG:In Ear(s)      ketoconazole (NIZORAL) 2 % shampoo Wash scalp a few times weekly as needed. Lather 1 minute before rinsing.     lidocaine (XYLOCAINE) 2 % solution SMARTSIG:15 Milliliter(s) By Mouth Every 3 Hours PRN     lidocaine (XYLOCAINE) 2 % solution 15 mL every three (3) hours as needed.     lidocaine (XYLOCAINE) 5 % ointment Apply 1 Application topically 3 (three) times daily. Use as needed. 1.25 g 1   linaclotide (LINZESS) 145 MCG CAPS capsule Take by mouth.     loperamide (IMODIUM) 2 MG capsule Take 1 capsule (2 mg) total by mouth once daily as needed for diarrhea.     loratadine (CLARITIN) 10 MG tablet Take by mouth.     montelukast (SINGULAIR) 10 MG tablet Take 1 tablet by mouth daily.     nystatin-triamcinolone ointment (MYCOLOG) Apply 1 Application topically 2 (two) times daily. Apply BID for up to 7 days. 60 g 0   ondansetron (ZOFRAN) 4 MG tablet Take 1 tablet by mouth up to two times a day as needed for nausea. Second choice.     pimecrolimus (ELIDEL) 1 % cream Apply topically.     PROAIR RESPICLICK 992 (90 Base) MCG/ACT AEPB INHALE 2 PUFFS EVERY 4-6 HOURS AS NEEDED FOR ASTHMA  3  prochlorperazine (COMPAZINE) 10 MG tablet Take by mouth.     SUMAtriptan (IMITREX) 50 MG tablet Take 1 tablet (50 mg total) by mouth every 2 (two) hours as needed for migraine. May repeat in 2 hours if headache persists or recurs. 10 tablet 6   triamcinolone ointment (KENALOG) 0.1 % Apply to affected areas twice a day as needed.     valACYclovir (VALTREX) 500 MG tablet Take one tablet by mouth three times a day for 3 days for an outbreak. 30 tablet 0   Vilazodone HCl (VIIBRYD) 40 MG TABS Take by mouth.     No current facility-administered medications on file prior to visit.     Review of Systems     Objective:  There were no vitals filed for this visit. BP Readings from Last 3 Encounters:  04/09/22 110/64  04/07/22 106/70  04/02/22 125/74   Wt Readings from Last 3 Encounters:  04/09/22 262 lb (118.8 kg)   04/07/22 237 lb (107.5 kg)  04/02/22 238 lb (108 kg)   There is no height or weight on file to calculate BMI.    Physical Exam     Lab Results  Component Value Date   WBC 13.2 (H) 04/02/2022   HGB 14.1 04/02/2022   HCT 43.3 04/02/2022   PLT 386 04/02/2022   GLUCOSE 116 (H) 04/02/2022   CHOL 206 (H) 03/22/2018   TRIG 88 03/22/2018   HDL 60 03/22/2018   LDLCALC 128 (H) 03/22/2018   ALT 44 04/02/2022   AST 39 04/02/2022   NA 139 04/02/2022   K 3.7 04/02/2022   CL 103 04/02/2022   CREATININE 1.05 (H) 04/02/2022   BUN 10 04/02/2022   CO2 21 (L) 04/02/2022   TSH 1.598 04/26/2013   HGBA1C 5.0 03/23/2022     Assessment & Plan:    See Problem List for Assessment and Plan of chronic medical problems.

## 2022-04-27 ENCOUNTER — Encounter: Payer: Self-pay | Admitting: Obstetrics and Gynecology

## 2022-04-27 ENCOUNTER — Ambulatory Visit (INDEPENDENT_AMBULATORY_CARE_PROVIDER_SITE_OTHER): Payer: BC Managed Care – PPO | Admitting: Obstetrics and Gynecology

## 2022-04-27 ENCOUNTER — Ambulatory Visit (INDEPENDENT_AMBULATORY_CARE_PROVIDER_SITE_OTHER): Payer: BC Managed Care – PPO | Admitting: Internal Medicine

## 2022-04-27 ENCOUNTER — Encounter: Payer: Self-pay | Admitting: Internal Medicine

## 2022-04-27 ENCOUNTER — Other Ambulatory Visit (HOSPITAL_COMMUNITY)
Admission: RE | Admit: 2022-04-27 | Discharge: 2022-04-27 | Disposition: A | Discharge status: Home / Self Care | Payer: BC Managed Care – PPO | Source: Ambulatory Visit | Attending: Obstetrics and Gynecology | Admitting: Obstetrics and Gynecology

## 2022-04-27 VITALS — BP 124/84 | Ht 68.0 in

## 2022-04-27 DIAGNOSIS — F419 Anxiety disorder, unspecified: Secondary | ICD-10-CM | POA: Diagnosis not present

## 2022-04-27 DIAGNOSIS — F988 Other specified behavioral and emotional disorders with onset usually occurring in childhood and adolescence: Secondary | ICD-10-CM | POA: Diagnosis not present

## 2022-04-27 DIAGNOSIS — R102 Pelvic and perineal pain: Secondary | ICD-10-CM

## 2022-04-27 DIAGNOSIS — G8929 Other chronic pain: Secondary | ICD-10-CM

## 2022-04-27 DIAGNOSIS — G43109 Migraine with aura, not intractable, without status migrainosus: Secondary | ICD-10-CM | POA: Diagnosis not present

## 2022-04-27 DIAGNOSIS — F3289 Other specified depressive episodes: Secondary | ICD-10-CM

## 2022-04-27 DIAGNOSIS — C9210 Chronic myeloid leukemia, BCR/ABL-positive, not having achieved remission: Secondary | ICD-10-CM

## 2022-04-27 DIAGNOSIS — N898 Other specified noninflammatory disorders of vagina: Secondary | ICD-10-CM

## 2022-04-27 DIAGNOSIS — K219 Gastro-esophageal reflux disease without esophagitis: Secondary | ICD-10-CM | POA: Insufficient documentation

## 2022-04-27 DIAGNOSIS — J45909 Unspecified asthma, uncomplicated: Secondary | ICD-10-CM

## 2022-04-27 DIAGNOSIS — J029 Acute pharyngitis, unspecified: Secondary | ICD-10-CM

## 2022-04-27 DIAGNOSIS — N766 Ulceration of vulva: Secondary | ICD-10-CM

## 2022-04-27 DIAGNOSIS — M549 Dorsalgia, unspecified: Secondary | ICD-10-CM | POA: Insufficient documentation

## 2022-04-27 DIAGNOSIS — R5382 Chronic fatigue, unspecified: Secondary | ICD-10-CM

## 2022-04-27 DIAGNOSIS — F32A Depression, unspecified: Secondary | ICD-10-CM | POA: Insufficient documentation

## 2022-04-27 DIAGNOSIS — J45901 Unspecified asthma with (acute) exacerbation: Secondary | ICD-10-CM | POA: Insufficient documentation

## 2022-04-27 DIAGNOSIS — R5383 Other fatigue: Secondary | ICD-10-CM | POA: Insufficient documentation

## 2022-04-27 MED ORDER — CLOBETASOL PROPIONATE 0.05 % EX OINT: 1.0000 | TOPICAL_OINTMENT | Freq: Two times a day (BID) | CUTANEOUS | 1 refills | Status: DC

## 2022-04-27 NOTE — Assessment & Plan Note (Signed)
Chronic Diagnosed 03/2021 Following with hematology/oncology at [facility] Currently on Dasatinib and doing well-she was having some symptoms and the medication was stopped for 2-3 weeks and there is no improvement so no side effects to the medication

## 2022-04-27 NOTE — Assessment & Plan Note (Addendum)
Chronic Following with psychiatry at [facility] been on several medications in the past and even they do not help or lose their effectiveness and need to be changed Currently on Viibryd 40 mg daily Plan is to try ketamine along with the Viibryd

## 2022-04-27 NOTE — Assessment & Plan Note (Signed)
Chronic Has back pain in 2 locations in her back-right upper back and left mid back Has seen pain management and was placed on naltrexone which does not seem to be helping Discussed considering orthopedics or sports medicine evaluation

## 2022-04-27 NOTE — Assessment & Plan Note (Signed)
Chronic Fairly controlled On Viibryd 40 mg daily, alprazolam 0.5 mg twice daily as needed Management per psychiatry

## 2022-04-27 NOTE — Assessment & Plan Note (Signed)
Chronic Has had asthma since a child Uses albuterol inhaler as needed

## 2022-04-27 NOTE — Patient Instructions (Addendum)
    Medications changes include :   none    

## 2022-04-27 NOTE — Assessment & Plan Note (Signed)
Somewhat chronic States she has been fatigued for a while-initially thought it was related to the CML, but that is under control and she feels worse than she did when she was diagnosed Reviewed blood work from care everywhere any nothing to explain fatigue She was concerned about her thyroid function, but this is in the normal range and being monitored closely by oncology Recommended continuing regular exercise and healthy diet Discussed that she is sleeping plenty-May be too much

## 2022-04-27 NOTE — Assessment & Plan Note (Signed)
Acute States sore throat for the past 2-3 days Throat nonerythematous on exam ?  Related to GERD versus viral infection Advised symptomatic treatment

## 2022-04-27 NOTE — Assessment & Plan Note (Addendum)
Chronic Following with neurology at [facility] With her migraines they can be severe-she can get aura, photophobia, phonophobia, nausea, blurry vision Has had vasovagal episodes with her migraines in the past Taking Imitrex 100 mg as needed Will hopefully be starting Botox injections

## 2022-04-27 NOTE — Assessment & Plan Note (Signed)
Chronic States daily GERD symptoms  Also states chronic hoarseness, difficulty swallowing and feeling of her throat being swollen Sees GI and had an endoscopy over the summer that looked normal Has an appointment to see ENT in November Symptoms could all be related to GERD-unfortunately not able to take GERD medication because of her chemo medication Advised following up with the pharmacist in the oncology office to see what she can take Stressed GERD diet Has an appointment to see ENT and also needs to follow-up with GI

## 2022-04-27 NOTE — Assessment & Plan Note (Signed)
Chronic Management per psychiatry-Melissa Helene Kelp at [facility] On Adderall XR 30 mg daily

## 2022-04-28 ENCOUNTER — Telehealth: Payer: Self-pay | Admitting: Obstetrics and Gynecology

## 2022-04-28 ENCOUNTER — Ambulatory Visit: Payer: BC Managed Care – PPO | Admitting: Physical Therapy

## 2022-04-28 NOTE — Telephone Encounter (Signed)
Please contact patient to schedule a lab visit and also a recheck with me in 2 - 3 weeks.   The patient may wish to do her blood work sooner than her office recheck with me.

## 2022-04-28 NOTE — Patient Instructions (Signed)
Lichen Sclerosus Lichen sclerosus is a skin problem. It can happen on any part of the body, but it commonly involves the anal and genital areas. It can cause itching and discomfort in these areas. Treatment can help to control symptoms. When the genital area is affected, getting treatment is important because the condition can cause scarring that may lead to other problems if left untreated. What are the causes? The cause of this condition is not known. It may be related to an overactive immune system or a lack of certain hormones. Lichen sclerosus is not an infection or a fungus, and it is not passed from one person to another (non-contagious). What increases the risk? The following factors may make you more likely to develop this condition: You are a woman who has reached menopause. You are a man who was not circumcised. This condition may also develop for the first time in children, usually before they enter puberty. What are the signs or symptoms? Symptoms of this condition include: White areas (plaques) on the skin that may be thin and wrinkled, or thickened. Red and swollen patches (lesions) on the skin. Tears or cracks in the skin. Bruising. Blood blisters. Severe itching. Pain, itching, or burning when urinating. Constipation is also common in children with lichen sclerosus, but can be seen in adults. How is this diagnosed? This condition may be diagnosed with a physical exam. In some cases, a tissue sample may be removed to be checked under a microscope (biopsy). How is this treated? This condition may be treated with: Topical steroids. These are medicated creams or ointments that are applied over the affected areas. Medicines that are taken by mouth. Topical immunotherapy. These are medicated creams or ointments that are applied over the affected areas. They stimulate your immune system to fight the skin condition. This may be used if steroids are not effective. Surgery. This may  be needed in more severe cases that are causing problems such as scarring. Follow these instructions at home: Medicines Take over-the-counter and prescription medicines only as told by your health care provider. Use creams or ointments as told by your health care provider. Skin care Do not scratch the affected areas of skin. If you are a woman, be sure to keep the vaginal area as clean and dry as possible. Clean the affected area of skin gently with water only. Pat skin dry and avoid the use of rough towels or toilet paper. Avoid irritating skin products, including soap and scented lotions. Use emollient creams as directed by your health care provider to help reduce itching. General instructions Keep all follow-up visits. This is important. Your condition may cause constipation. To prevent or treat constipation, you may need to: Drink enough fluid to keep your urine pale yellow. Take over-the-counter or prescription medicines. Eat foods that are high in fiber, such as beans, whole grains, and fresh fruits and vegetables. Limit foods that are high in fat and processed sugars, such as fried or sweet foods. Contact a health care provider if: You have increasing redness, swelling, or pain in the affected area. You have fluid, blood, or pus coming from the affected area. You have new lesions on your skin. You have a fever. You have pain during sex. Get help right away if: You develop severe pain or burning in the affected areas, especially in the genital area. Summary Lichen sclerosus is a skin problem. When the genital area is affected, getting treatment is important because the condition can cause scarring that may   lead to other problems if left untreated. This condition is usually treated with medicated creams or ointments (topical steroids) that are applied over the affected areas. Take or use over-the-counter and prescription medicines only as told by your health care provider. Contact a  health care provider if you have new lesions on your skin, have pain during sex, or have increasing redness, swelling, or pain in the affected area. Keep all follow-up visits. This is important. This information is not intended to replace advice given to you by your health care provider. Make sure you discuss any questions you have with your health care provider. Document Revised: 12/01/2019 Document Reviewed: 12/01/2019 Elsevier Patient Education  2023 Elsevier Inc.  

## 2022-04-29 ENCOUNTER — Other Ambulatory Visit: Payer: Self-pay | Admitting: Nurse Practitioner

## 2022-04-29 ENCOUNTER — Encounter: Payer: Self-pay | Admitting: Obstetrics and Gynecology

## 2022-04-29 ENCOUNTER — Encounter: Payer: Self-pay | Admitting: Internal Medicine

## 2022-04-29 ENCOUNTER — Telehealth (INDEPENDENT_AMBULATORY_CARE_PROVIDER_SITE_OTHER): Payer: BC Managed Care – PPO | Admitting: Nurse Practitioner

## 2022-04-29 VITALS — Temp 99.4°F

## 2022-04-29 DIAGNOSIS — U071 COVID-19: Secondary | ICD-10-CM | POA: Diagnosis not present

## 2022-04-29 LAB — CERVICOVAGINAL ANCILLARY ONLY
Bacterial Vaginitis (gardnerella): NEGATIVE
Candida Glabrata: NEGATIVE
Candida Vaginitis: NEGATIVE
Comment: NEGATIVE
Comment: NEGATIVE
Comment: NEGATIVE
Comment: NEGATIVE
Trichomonas: NEGATIVE

## 2022-04-29 MED ORDER — PROAIR RESPICLICK 108 (90 BASE) MCG/ACT IN AEPB: 1.0000 | INHALATION_SPRAY | RESPIRATORY_TRACT | 3 refills | Status: DC | PRN

## 2022-04-29 MED ORDER — MOLNUPIRAVIR EUA 200MG CAPSULE: 4.0000 | ORAL_CAPSULE | Freq: Two times a day (BID) | ORAL | 0 refills | Status: AC

## 2022-04-29 MED ORDER — BENZONATATE 200 MG PO CAPS: 200.0000 mg | ORAL_CAPSULE | Freq: Two times a day (BID) | ORAL | 0 refills | Status: DC | PRN

## 2022-04-29 MED ORDER — BUDESONIDE-FORMOTEROL FUMARATE 160-4.5 MCG/ACT IN AERO: 2.0000 | INHALATION_SPRAY | Freq: Two times a day (BID) | RESPIRATORY_TRACT | 1 refills | Status: DC | PRN

## 2022-04-29 NOTE — Assessment & Plan Note (Signed)
Acute, treat with molnupiravir as patient appears to be at higher risk for severe illness.  Recommend use of Tessalon Perles as needed for cough suppression.  Can also continue taking NyQuil or Robitussin if needed.  Refill for rescue ProAir inhaler as well as Symbicort sent to patient's pharmacy.  Patient educated to proceed to the emergency department if symptoms worsen especially if she experiences shortness of breath, confusion, or worsening fever.

## 2022-04-29 NOTE — Progress Notes (Signed)
   Established Patient Office Visit  An audio/visual tele-health visit was completed today for this patient. I connected with  ASUCENA GALER on 04/29/22 utilizing audio/visual technology and verified that I am speaking with the correct person using two identifiers. The patient was located at their home, and I was located at the office of [place] at [facility] during the encounter. I discussed the limitations of evaluation and management by telemedicine. The patient expressed understanding and agreed to proceed.     Subjective   Patient ID: Desiree Carlson, female    DOB: January 06, 1996  Age: 26 y.o. MRN: [id number]  Chief Complaint  Patient presents with   Covid Positive    Symptom started 4 days ago.  She experiencing headache, chills, body aches, cough, fatigue.  She has history of asthma and CML.  She took COVID test at home which was positive.  She has been vaccinated against COVID.  Has been taking NyQuil and Robitussin as needed for symptom management.    Review of Systems  Constitutional:  Positive for fever.  HENT:  Positive for congestion and sore throat.   Respiratory:  Positive for cough. Negative for sputum production and shortness of breath.   Gastrointestinal:  Positive for diarrhea.  Musculoskeletal:  Positive for myalgias.  Neurological:  Positive for headaches.  Psychiatric/Behavioral:  The patient does not have insomnia.       Objective:     Temp 99.4 F (37.4 C)   LMP 04/22/2022    Physical Exam Comprehensive physical exam not completed today as office visit was conducted remotely.  Patient appeared well on video, she did not appear to be in any acute respiratory distress.  Patient was alert and oriented, and appeared to have appropriate judgment.   No results found for any visits on 04/29/22.    The ASCVD Risk score (Arnett DK, et al., 2019) failed to calculate for the following reasons:   The 2019 ASCVD risk score is only valid for ages  24 to 15    Assessment & Plan:   Problem List Items Addressed This Visit       Other   COVID-19 - Primary    Acute, treat with molnupiravir as patient appears to be at higher risk for severe illness.  Recommend use of Tessalon Perles as needed for cough suppression.  Can also continue taking NyQuil or Robitussin if needed.  Refill for rescue ProAir inhaler as well as Symbicort sent to patient's pharmacy.  Patient educated to proceed to the emergency department if symptoms worsen especially if she experiences shortness of breath, confusion, or worsening fever.      Relevant Medications   molnupiravir EUA (LAGEVRIO) 200 mg CAPS capsule   benzonatate (TESSALON) 200 MG capsule   budesonide-formoterol (SYMBICORT) 160-4.5 MCG/ACT inhaler   PROAIR RESPICLICK 924 (90 Base) MCG/ACT AEPB    No follow-ups on file.    Ailene Ards, NP

## 2022-04-30 ENCOUNTER — Encounter: Payer: Self-pay | Admitting: Obstetrics and Gynecology

## 2022-05-03 ENCOUNTER — Ambulatory Visit: Payer: BC Managed Care – PPO | Admitting: Physical Therapy

## 2022-05-05 DIAGNOSIS — F329 Major depressive disorder, single episode, unspecified: Secondary | ICD-10-CM | POA: Diagnosis not present

## 2022-05-05 DIAGNOSIS — K2100 Gastro-esophageal reflux disease with esophagitis, without bleeding: Secondary | ICD-10-CM | POA: Diagnosis not present

## 2022-05-05 DIAGNOSIS — K3189 Other diseases of stomach and duodenum: Secondary | ICD-10-CM | POA: Diagnosis not present

## 2022-05-05 DIAGNOSIS — K582 Mixed irritable bowel syndrome: Secondary | ICD-10-CM | POA: Diagnosis not present

## 2022-05-05 DIAGNOSIS — Z8719 Personal history of other diseases of the digestive system: Secondary | ICD-10-CM | POA: Diagnosis not present

## 2022-05-05 NOTE — Progress Notes (Unsigned)
[facility]  BOTULINUM TOXIN INJECTION PROCEDURE NOTE  Patient: Desiree Carlson MRN: [id number]  Indication: Chronic migraines   History: 26 year old female with a history of asthma, CML, suspected endometriosis who follows in clinic for chronic migraines  Last injection: First injection today  Technique: Informed consent was obtained and signed. 200 units onabotulinumtoxinA (Lot# [id number]; Expiration: 09/2024) were reconstituted using normal saline, to a concentration of 5 units per 0.72m. 155 units were injected using sterile technique across 31 sites as follows: corrugator 10, procerus 5 units, frontalis 20 units, temporalis 40 units, occipitalis 30 units, cervical paraspinal 20 units, trapezius 30 units. 45 units were wasted. Patient tolerated procedure without complication.  Plan: -Rescue: Continue Imitrex 50 mg PRN -Return for Botox in 3 months  Tenita Cue 05/06/22 1:21 PM

## 2022-05-06 ENCOUNTER — Ambulatory Visit (INDEPENDENT_AMBULATORY_CARE_PROVIDER_SITE_OTHER): Payer: BC Managed Care – PPO | Admitting: Psychiatry

## 2022-05-06 ENCOUNTER — Encounter: Payer: Self-pay | Admitting: Obstetrics and Gynecology

## 2022-05-06 VITALS — BP 111/65 | HR 55

## 2022-05-06 DIAGNOSIS — G43719 Chronic migraine without aura, intractable, without status migrainosus: Secondary | ICD-10-CM | POA: Diagnosis not present

## 2022-05-06 MED ORDER — ONABOTULINUMTOXINA 200 UNITS IJ SOLR
155.0000 [IU] | Freq: Once | INTRAMUSCULAR | Status: AC
  Administered 2022-05-06: 155 [IU] via INTRAMUSCULAR

## 2022-05-06 MED ORDER — SUMATRIPTAN SUCCINATE 50 MG PO TABS: 50.0000 mg | ORAL_TABLET | ORAL | 6 refills | Status: DC | PRN

## 2022-05-06 NOTE — Progress Notes (Signed)
Botox- 200 units x 1 vial Lot: [id number] Expiration: 09/2024 NDC: 9290-9030-14  Bacteriostatic 0.9% Sodium Chloride- 82m total Lot: [id number]: 03 Apr 2023 NDC: [address]-44 Dx: GQ58.483S/P

## 2022-05-08 NOTE — Telephone Encounter (Signed)
Encounter reviewed and closed.  Has appointment on 05/18/22.

## 2022-05-10 DIAGNOSIS — F4310 Post-traumatic stress disorder, unspecified: Secondary | ICD-10-CM | POA: Diagnosis not present

## 2022-05-10 DIAGNOSIS — F339 Major depressive disorder, recurrent, unspecified: Secondary | ICD-10-CM | POA: Diagnosis not present

## 2022-05-10 DIAGNOSIS — F332 Major depressive disorder, recurrent severe without psychotic features: Secondary | ICD-10-CM | POA: Diagnosis not present

## 2022-05-10 DIAGNOSIS — F419 Anxiety disorder, unspecified: Secondary | ICD-10-CM | POA: Diagnosis not present

## 2022-05-11 DIAGNOSIS — L280 Lichen simplex chronicus: Secondary | ICD-10-CM | POA: Insufficient documentation

## 2022-05-11 NOTE — Progress Notes (Deleted)
  Subjective:     Patient ID: Desiree Carlson, female   DOB: Jul 16, 1996, 26 y.o.   MRN: [id number]  HPI   Review of Systems     Objective:   Physical Exam     Assessment:     ***    Plan:     ***

## 2022-05-12 DIAGNOSIS — Z6836 Body mass index (BMI) 36.0-36.9, adult: Secondary | ICD-10-CM | POA: Diagnosis not present

## 2022-05-12 DIAGNOSIS — G894 Chronic pain syndrome: Secondary | ICD-10-CM | POA: Diagnosis not present

## 2022-05-12 DIAGNOSIS — M7918 Myalgia, other site: Secondary | ICD-10-CM | POA: Diagnosis not present

## 2022-05-12 DIAGNOSIS — R1084 Generalized abdominal pain: Secondary | ICD-10-CM | POA: Diagnosis not present

## 2022-05-12 DIAGNOSIS — M549 Dorsalgia, unspecified: Secondary | ICD-10-CM | POA: Diagnosis not present

## 2022-05-13 DIAGNOSIS — G8929 Other chronic pain: Secondary | ICD-10-CM | POA: Diagnosis not present

## 2022-05-13 DIAGNOSIS — K625 Hemorrhage of anus and rectum: Secondary | ICD-10-CM | POA: Diagnosis not present

## 2022-05-13 DIAGNOSIS — R102 Pelvic and perineal pain: Secondary | ICD-10-CM | POA: Diagnosis not present

## 2022-05-13 DIAGNOSIS — N946 Dysmenorrhea, unspecified: Secondary | ICD-10-CM | POA: Diagnosis not present

## 2022-05-18 ENCOUNTER — Other Ambulatory Visit (HOSPITAL_COMMUNITY)
Admission: RE | Admit: 2022-05-18 | Discharge: 2022-05-18 | Disposition: A | Discharge status: Home / Self Care | Payer: BC Managed Care – PPO | Source: Ambulatory Visit | Attending: Obstetrics and Gynecology | Admitting: Obstetrics and Gynecology

## 2022-05-18 ENCOUNTER — Ambulatory Visit (INDEPENDENT_AMBULATORY_CARE_PROVIDER_SITE_OTHER): Payer: BC Managed Care – PPO | Admitting: Obstetrics and Gynecology

## 2022-05-18 ENCOUNTER — Encounter: Payer: Self-pay | Admitting: Obstetrics and Gynecology

## 2022-05-18 VITALS — BP 110/72 | HR 88 | Wt 241.0 lb

## 2022-05-18 DIAGNOSIS — Z01812 Encounter for preprocedural laboratory examination: Secondary | ICD-10-CM | POA: Diagnosis not present

## 2022-05-18 DIAGNOSIS — N926 Irregular menstruation, unspecified: Secondary | ICD-10-CM | POA: Diagnosis not present

## 2022-05-18 DIAGNOSIS — N9089 Other specified noninflammatory disorders of vulva and perineum: Secondary | ICD-10-CM | POA: Insufficient documentation

## 2022-05-18 DIAGNOSIS — L089 Local infection of the skin and subcutaneous tissue, unspecified: Secondary | ICD-10-CM | POA: Diagnosis not present

## 2022-05-18 DIAGNOSIS — L280 Lichen simplex chronicus: Secondary | ICD-10-CM | POA: Diagnosis not present

## 2022-05-18 LAB — PREGNANCY, URINE: Preg Test, Ur: NEGATIVE

## 2022-05-18 NOTE — Patient Instructions (Signed)
Vulva Biopsy, Care After After a vulva biopsy, it is common to have: Slight bleeding from the biopsy site. Soreness or slight pain at the biopsy site. Follow these instructions at home: Your doctor may give you more instructions. If you have problems, contact your doctor. Biopsy site care  Follow instructions from your doctor about how to take care of your biopsy site. Make sure you: Clean the area using water and mild soap two times a day or as told by your doctor. Gently pat the area dry. You may shower 24 hours after the procedure. If you were prescribed an antibiotic ointment, apply it as told by your doctor. Do not stop using the antibiotic even if your condition gets better. If told by your doctor, take a warm water bath (sitz bath) to help with pain and soreness. A sitz bath is taken while you are sitting down. Do this as often as told by your doctor. The water should only come up to your hips and cover your butt. You may pat the area dry with a soft, clean towel. Leave stitches (sutures) or skin glue in place for at least 2 weeks. Leave tape strips alone unless you are told to take them off. You may trim the edges of the tape strips if they curl up. Check your biopsy area every day for signs of infection. It may be helpful to use a handheld mirror to do this. Check for: Redness, swelling, or more pain. More fluid or blood. Warmth. Pus or a bad smell. Do not rub the biopsy area after peeing (urinating). Gently pat the area dry, or use a bottle filled with warm water (peri bottle) to clean the area. Gently wipe from front to back. Lifestyle Wear loose, cotton underwear. Do not wear tight pants. For at least 1 week or until your doctor says it is okay: Do not use a tampon, douche, or put anything in your vagina. Do not have sex. Until your doctor says it is okay: Do not exercise. Do not swim or use a hot tub. General instructions Take over-the-counter and prescription  medicines only as told by your doctor. Drink enough fluid to keep your pee (urine) pale yellow. Use a sanitary pad until the bleeding stops. If told, put ice on the biopsy site. To do this: Place ice in a plastic bag. Place a towel between your skin and the bag. Leave the ice on for 20 minutes, 2-3 times a day. Take off the ice if your skin turns bright red. This is very important. If you cannot feel pain, heat, or cold, you have a greater risk of damage to the area. Keep all follow-up visits. Contact a doctor if: You have redness, swelling, or more pain around your biopsy site. You have more fluid or blood coming from your biopsy site. Your biopsy site feels warm when you touch it. Medicines or ice packs do not help with your pain. You have a fever or chills. Get help right away if: You have a lot of bleeding from the vulva. You have pus or a bad smell coming from your biopsy site. You have pain in your belly (abdomen). Summary After the procedure, it is common to have slight bleeding and soreness at the biopsy site. Follow all instructions as told by your doctor. Take sitz baths as told by your doctor. Leave any stitches in place. Check your biopsy site for infection. Signs include redness, swelling, more pain, more fluid or blood, or warmth. Get help   right away if you have a lot of bleeding, pus or a bad smell, or pain in your belly. This information is not intended to replace advice given to you by your health care provider. Make sure you discuss any questions you have with your health care provider. Document Revised: 04/07/2021 Document Reviewed: 04/07/2021 Elsevier Patient Education  2023 Elsevier Inc.  
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
## 2022-05-20 DIAGNOSIS — F339 Major depressive disorder, recurrent, unspecified: Secondary | ICD-10-CM | POA: Diagnosis not present

## 2022-05-20 DIAGNOSIS — K582 Mixed irritable bowel syndrome: Secondary | ICD-10-CM | POA: Diagnosis not present

## 2022-05-20 DIAGNOSIS — C9210 Chronic myeloid leukemia, BCR/ABL-positive, not having achieved remission: Secondary | ICD-10-CM | POA: Diagnosis not present

## 2022-05-20 DIAGNOSIS — L508 Other urticaria: Secondary | ICD-10-CM | POA: Diagnosis not present

## 2022-05-20 LAB — SURGICAL PATHOLOGY

## 2022-05-22 ENCOUNTER — Other Ambulatory Visit: Payer: Self-pay | Admitting: Obstetrics and Gynecology

## 2022-05-24 ENCOUNTER — Other Ambulatory Visit: Payer: BC Managed Care – PPO

## 2022-05-24 ENCOUNTER — Encounter: Payer: Self-pay | Admitting: Obstetrics and Gynecology

## 2022-05-24 NOTE — Telephone Encounter (Signed)
Just FYI.

## 2022-05-25 DIAGNOSIS — F419 Anxiety disorder, unspecified: Secondary | ICD-10-CM | POA: Diagnosis not present

## 2022-05-25 DIAGNOSIS — F332 Major depressive disorder, recurrent severe without psychotic features: Secondary | ICD-10-CM | POA: Diagnosis not present

## 2022-05-25 DIAGNOSIS — Z888 Allergy status to other drugs, medicaments and biological substances status: Secondary | ICD-10-CM | POA: Diagnosis not present

## 2022-05-25 DIAGNOSIS — Z79899 Other long term (current) drug therapy: Secondary | ICD-10-CM | POA: Diagnosis not present

## 2022-05-25 DIAGNOSIS — L508 Other urticaria: Secondary | ICD-10-CM | POA: Diagnosis not present

## 2022-05-25 DIAGNOSIS — Z881 Allergy status to other antibiotic agents status: Secondary | ICD-10-CM | POA: Diagnosis not present

## 2022-05-25 DIAGNOSIS — T783XXA Angioneurotic edema, initial encounter: Secondary | ICD-10-CM | POA: Diagnosis not present

## 2022-05-25 DIAGNOSIS — F32A Depression, unspecified: Secondary | ICD-10-CM | POA: Diagnosis not present

## 2022-05-25 DIAGNOSIS — M109 Gout, unspecified: Secondary | ICD-10-CM | POA: Diagnosis not present

## 2022-05-28 ENCOUNTER — Other Ambulatory Visit: Payer: BC Managed Care – PPO

## 2022-05-28 DIAGNOSIS — Z3141 Encounter for fertility testing: Secondary | ICD-10-CM | POA: Diagnosis not present

## 2022-05-28 DIAGNOSIS — C9210 Chronic myeloid leukemia, BCR/ABL-positive, not having achieved remission: Secondary | ICD-10-CM | POA: Diagnosis not present

## 2022-05-28 DIAGNOSIS — N926 Irregular menstruation, unspecified: Secondary | ICD-10-CM

## 2022-05-29 LAB — FSH/LH
FSH: 7.6 m[IU]/mL
LH: 9.9 m[IU]/mL

## 2022-05-29 LAB — ESTRADIOL: Estradiol: 39 pg/mL

## 2022-05-29 LAB — PROLACTIN: Prolactin: 21.4 ng/mL

## 2022-05-29 LAB — T4, FREE: Free T4: 1.2 ng/dL (ref 0.8–1.8)

## 2022-05-29 LAB — TSH: TSH: 2.58 mIU/L

## 2022-05-30 DIAGNOSIS — H0012 Chalazion right lower eyelid: Secondary | ICD-10-CM | POA: Diagnosis not present

## 2022-05-30 DIAGNOSIS — G4489 Other headache syndrome: Secondary | ICD-10-CM | POA: Diagnosis not present

## 2022-05-31 LAB — ANTI-MULLERIAN HORMONE (AMH), FEMALE: Anti-Mullerian Hormones(AMH), Female: 2.26 ng/mL (ref 0.69–13.39)

## 2022-06-02 DIAGNOSIS — R309 Painful micturition, unspecified: Secondary | ICD-10-CM | POA: Diagnosis not present

## 2022-06-02 DIAGNOSIS — M7918 Myalgia, other site: Secondary | ICD-10-CM | POA: Diagnosis not present

## 2022-06-02 DIAGNOSIS — M549 Dorsalgia, unspecified: Secondary | ICD-10-CM | POA: Diagnosis not present

## 2022-06-07 DIAGNOSIS — F419 Anxiety disorder, unspecified: Secondary | ICD-10-CM | POA: Diagnosis not present

## 2022-06-07 DIAGNOSIS — R490 Dysphonia: Secondary | ICD-10-CM | POA: Diagnosis not present

## 2022-06-07 DIAGNOSIS — F339 Major depressive disorder, recurrent, unspecified: Secondary | ICD-10-CM | POA: Diagnosis not present

## 2022-06-08 ENCOUNTER — Encounter (INDEPENDENT_AMBULATORY_CARE_PROVIDER_SITE_OTHER): Payer: BC Managed Care – PPO | Admitting: Psychiatry

## 2022-06-08 DIAGNOSIS — G43719 Chronic migraine without aura, intractable, without status migrainosus: Secondary | ICD-10-CM

## 2022-06-08 MED ORDER — TOPIRAMATE 25 MG PO TABS: ORAL_TABLET | ORAL | 6 refills | Status: DC

## 2022-06-08 NOTE — Telephone Encounter (Signed)

## 2022-06-09 DIAGNOSIS — F411 Generalized anxiety disorder: Secondary | ICD-10-CM | POA: Diagnosis not present

## 2022-06-09 DIAGNOSIS — F332 Major depressive disorder, recurrent severe without psychotic features: Secondary | ICD-10-CM | POA: Diagnosis not present

## 2022-06-09 DIAGNOSIS — R52 Pain, unspecified: Secondary | ICD-10-CM | POA: Diagnosis not present

## 2022-06-09 DIAGNOSIS — F4310 Post-traumatic stress disorder, unspecified: Secondary | ICD-10-CM | POA: Diagnosis not present

## 2022-06-14 DIAGNOSIS — F332 Major depressive disorder, recurrent severe without psychotic features: Secondary | ICD-10-CM | POA: Diagnosis not present

## 2022-06-16 ENCOUNTER — Encounter: Payer: Self-pay | Admitting: Obstetrics and Gynecology

## 2022-06-16 NOTE — Telephone Encounter (Signed)
Please contact patient about her Antimullerian hormone level which was in a normal range.   I am not certain if she received this result.

## 2022-06-17 DIAGNOSIS — F988 Other specified behavioral and emotional disorders with onset usually occurring in childhood and adolescence: Secondary | ICD-10-CM | POA: Diagnosis not present

## 2022-06-17 DIAGNOSIS — J45909 Unspecified asthma, uncomplicated: Secondary | ICD-10-CM | POA: Diagnosis not present

## 2022-06-17 DIAGNOSIS — F4310 Post-traumatic stress disorder, unspecified: Secondary | ICD-10-CM | POA: Diagnosis not present

## 2022-06-17 DIAGNOSIS — Z8616 Personal history of COVID-19: Secondary | ICD-10-CM | POA: Diagnosis not present

## 2022-06-17 DIAGNOSIS — C9210 Chronic myeloid leukemia, BCR/ABL-positive, not having achieved remission: Secondary | ICD-10-CM | POA: Diagnosis not present

## 2022-06-17 DIAGNOSIS — F32A Depression, unspecified: Secondary | ICD-10-CM | POA: Diagnosis not present

## 2022-06-17 DIAGNOSIS — R32 Unspecified urinary incontinence: Secondary | ICD-10-CM | POA: Diagnosis not present

## 2022-06-17 DIAGNOSIS — R110 Nausea: Secondary | ICD-10-CM | POA: Diagnosis not present

## 2022-06-17 DIAGNOSIS — R079 Chest pain, unspecified: Secondary | ICD-10-CM | POA: Diagnosis not present

## 2022-06-17 DIAGNOSIS — G8929 Other chronic pain: Secondary | ICD-10-CM | POA: Diagnosis not present

## 2022-06-17 DIAGNOSIS — R109 Unspecified abdominal pain: Secondary | ICD-10-CM | POA: Diagnosis not present

## 2022-06-17 DIAGNOSIS — M545 Low back pain, unspecified: Secondary | ICD-10-CM | POA: Diagnosis not present

## 2022-06-17 DIAGNOSIS — G43909 Migraine, unspecified, not intractable, without status migrainosus: Secondary | ICD-10-CM | POA: Diagnosis not present

## 2022-06-17 DIAGNOSIS — R319 Hematuria, unspecified: Secondary | ICD-10-CM | POA: Diagnosis not present

## 2022-06-17 DIAGNOSIS — R300 Dysuria: Secondary | ICD-10-CM | POA: Diagnosis not present

## 2022-06-17 DIAGNOSIS — F332 Major depressive disorder, recurrent severe without psychotic features: Secondary | ICD-10-CM | POA: Diagnosis not present

## 2022-06-17 DIAGNOSIS — C9211 Chronic myeloid leukemia, BCR/ABL-positive, in remission: Secondary | ICD-10-CM | POA: Diagnosis not present

## 2022-06-17 DIAGNOSIS — F419 Anxiety disorder, unspecified: Secondary | ICD-10-CM | POA: Diagnosis not present

## 2022-06-21 DIAGNOSIS — F332 Major depressive disorder, recurrent severe without psychotic features: Secondary | ICD-10-CM | POA: Diagnosis not present

## 2022-06-22 DIAGNOSIS — R490 Dysphonia: Secondary | ICD-10-CM | POA: Diagnosis not present

## 2022-06-23 DIAGNOSIS — F332 Major depressive disorder, recurrent severe without psychotic features: Secondary | ICD-10-CM | POA: Diagnosis not present

## 2022-06-25 ENCOUNTER — Other Ambulatory Visit: Payer: Self-pay | Admitting: Nurse Practitioner

## 2022-06-25 DIAGNOSIS — U071 COVID-19: Secondary | ICD-10-CM

## 2022-06-28 DIAGNOSIS — K219 Gastro-esophageal reflux disease without esophagitis: Secondary | ICD-10-CM | POA: Diagnosis not present

## 2022-06-28 DIAGNOSIS — Z888 Allergy status to other drugs, medicaments and biological substances status: Secondary | ICD-10-CM | POA: Diagnosis not present

## 2022-06-28 DIAGNOSIS — F4310 Post-traumatic stress disorder, unspecified: Secondary | ICD-10-CM | POA: Diagnosis not present

## 2022-06-28 DIAGNOSIS — R932 Abnormal findings on diagnostic imaging of liver and biliary tract: Secondary | ICD-10-CM | POA: Diagnosis not present

## 2022-06-28 DIAGNOSIS — F900 Attention-deficit hyperactivity disorder, predominantly inattentive type: Secondary | ICD-10-CM | POA: Diagnosis not present

## 2022-06-28 DIAGNOSIS — Z8616 Personal history of COVID-19: Secondary | ICD-10-CM | POA: Diagnosis not present

## 2022-06-28 DIAGNOSIS — K3189 Other diseases of stomach and duodenum: Secondary | ICD-10-CM | POA: Diagnosis not present

## 2022-06-28 DIAGNOSIS — R1011 Right upper quadrant pain: Secondary | ICD-10-CM | POA: Diagnosis not present

## 2022-06-28 DIAGNOSIS — R1010 Upper abdominal pain, unspecified: Secondary | ICD-10-CM | POA: Diagnosis not present

## 2022-06-28 DIAGNOSIS — R1012 Left upper quadrant pain: Secondary | ICD-10-CM | POA: Diagnosis not present

## 2022-06-28 DIAGNOSIS — J45909 Unspecified asthma, uncomplicated: Secondary | ICD-10-CM | POA: Diagnosis not present

## 2022-06-29 DIAGNOSIS — R490 Dysphonia: Secondary | ICD-10-CM | POA: Diagnosis not present

## 2022-07-01 DIAGNOSIS — F332 Major depressive disorder, recurrent severe without psychotic features: Secondary | ICD-10-CM | POA: Diagnosis not present

## 2022-07-05 DIAGNOSIS — F332 Major depressive disorder, recurrent severe without psychotic features: Secondary | ICD-10-CM | POA: Diagnosis not present

## 2022-07-06 DIAGNOSIS — R490 Dysphonia: Secondary | ICD-10-CM | POA: Diagnosis not present

## 2022-07-07 DIAGNOSIS — F332 Major depressive disorder, recurrent severe without psychotic features: Secondary | ICD-10-CM | POA: Diagnosis not present

## 2022-07-12 DIAGNOSIS — F332 Major depressive disorder, recurrent severe without psychotic features: Secondary | ICD-10-CM | POA: Diagnosis not present

## 2022-07-13 DIAGNOSIS — R52 Pain, unspecified: Secondary | ICD-10-CM | POA: Diagnosis not present

## 2022-07-13 DIAGNOSIS — F4310 Post-traumatic stress disorder, unspecified: Secondary | ICD-10-CM | POA: Diagnosis not present

## 2022-07-13 DIAGNOSIS — F419 Anxiety disorder, unspecified: Secondary | ICD-10-CM | POA: Diagnosis not present

## 2022-07-13 DIAGNOSIS — F332 Major depressive disorder, recurrent severe without psychotic features: Secondary | ICD-10-CM | POA: Diagnosis not present

## 2022-07-13 DIAGNOSIS — F410 Panic disorder [episodic paroxysmal anxiety] without agoraphobia: Secondary | ICD-10-CM | POA: Diagnosis not present

## 2022-07-13 DIAGNOSIS — F411 Generalized anxiety disorder: Secondary | ICD-10-CM | POA: Diagnosis not present

## 2022-07-15 ENCOUNTER — Encounter: Payer: Self-pay | Admitting: Psychiatry

## 2022-07-18 NOTE — Progress Notes (Unsigned)
Subjective:    Patient ID: Desiree Carlson, female    DOB: 1996-03-22, 26 y.o.   MRN: [id number]      HPI Hennessy is here for No chief complaint on file.    She is here for an acute visit for cold symptoms.   Her symptoms started   She is experiencing   She has tried taking      Medications and allergies reviewed with patient and updated if appropriate.  Current Outpatient Medications on File Prior to Visit  Medication Sig Dispense Refill   ALPRAZolam (XANAX) 0.5 MG tablet TAKE 1 TABLET BY MOUTH TWICE DAILY AS NEEDED FOR ANXIETY FOR 30 DAYS     amphetamine-dextroamphetamine (ADDERALL XR) 30 MG 24 hr capsule Take 1 capsule by mouth every morning.     benzonatate (TESSALON) 200 MG capsule Take 1 capsule (200 mg total) by mouth 2 (two) times daily as needed for cough. 20 capsule 0   botulinum toxin Type A (BOTOX) 200 units injection Inject 200 Units into the muscle every 3 (three) months. 1 each 11   budesonide-formoterol (SYMBICORT) 160-4.5 MCG/ACT inhaler Inhale 2 puffs into the lungs 2 (two) times daily as needed. 1 each 1   cetirizine (ZYRTEC) 10 MG tablet Take by mouth.     clobetasol ointment (TEMOVATE) 4.58 % Apply 1 Application topically 2 (two) times daily. Place in a thin layer of the affected areas twice a day for 2 weeks.  Then place on the affected areas twice a week at bedtime. 60 g 1   dasatinib (SPRYCEL) 70 MG tablet 1 tablet - PER [facility] onc Orally Once a day     diclofenac Sodium (VOLTAREN) 1 % GEL as needed.     fluticasone (FLONASE) 50 MCG/ACT nasal spray 1 spray into each nostril daily.     ibuprofen (ADVIL) 200 MG tablet Take by mouth as needed.     ketoconazole (NIZORAL) 2 % shampoo      lidocaine (XYLOCAINE) 2 % solution 15 mL every three (3) hours as needed.     lidocaine (XYLOCAINE) 5 % ointment Apply 1 Application topically 3 (three) times daily. Use as needed. 1.25 g 1   linaclotide (LINZESS) 145 MCG CAPS capsule Take by mouth.     loperamide  (IMODIUM) 2 MG capsule Take 1 capsule (2 mg) total by mouth once daily as needed for diarrhea.     loratadine (CLARITIN) 10 MG tablet Take by mouth.     montelukast (SINGULAIR) 10 MG tablet Take 1 tablet by mouth daily.     Naltrexone HCl, Pain, 4.5 MG CAPS Take by mouth.     ondansetron (ZOFRAN) 4 MG tablet Take 1 tablet by mouth up to two times a day as needed for nausea. Second choice.     pimecrolimus (ELIDEL) 1 % cream Apply topically.     PROAIR RESPICLICK 099 (90 Base) MCG/ACT AEPB Inhale 1 puff into the lungs every 4 (four) hours as needed (RESCUE INHALER). 1 each 3   prochlorperazine (COMPAZINE) 10 MG tablet Take by mouth.     SUMAtriptan (IMITREX) 50 MG tablet Take 1 tablet (50 mg total) by mouth every 2 (two) hours as needed for migraine. May repeat in 2 hours if headache persists or recurs. 10 tablet 6   topiramate (TOPAMAX) 25 MG tablet Take 25 mg (1 pill) at bedtime for one week, then increase to 50 mg (2 pills) at bedtime for one week, then take 75 mg (3 pills) at bedtime  for one week, then take 100 mg (4 pills) at bedtime 120 tablet 6   triamcinolone ointment (KENALOG) 0.1 % Apply to affected areas twice a day as needed.     Vilazodone HCl (VIIBRYD) 40 MG TABS Take by mouth.     No current facility-administered medications on file prior to visit.    Review of Systems     Objective:  There were no vitals filed for this visit. BP Readings from Last 3 Encounters:  05/18/22 110/72  05/06/22 111/65  04/27/22 124/84   Wt Readings from Last 3 Encounters:  05/18/22 241 lb (109.3 kg)  04/27/22 234 lb (106.1 kg)  04/09/22 262 lb (118.8 kg)   There is no height or weight on file to calculate BMI.    Physical Exam         Assessment & Plan:    See Problem List for Assessment and Plan of chronic medical problems.

## 2022-07-19 ENCOUNTER — Encounter: Payer: Self-pay | Admitting: Internal Medicine

## 2022-07-19 ENCOUNTER — Ambulatory Visit (INDEPENDENT_AMBULATORY_CARE_PROVIDER_SITE_OTHER): Payer: BC Managed Care – PPO

## 2022-07-19 ENCOUNTER — Ambulatory Visit (INDEPENDENT_AMBULATORY_CARE_PROVIDER_SITE_OTHER): Payer: BC Managed Care – PPO | Admitting: Internal Medicine

## 2022-07-19 VITALS — BP 102/78 | HR 85 | Temp 98.0°F | Ht 68.0 in | Wt 249.0 lb

## 2022-07-19 DIAGNOSIS — R350 Frequency of micturition: Secondary | ICD-10-CM

## 2022-07-19 DIAGNOSIS — R062 Wheezing: Secondary | ICD-10-CM | POA: Diagnosis not present

## 2022-07-19 DIAGNOSIS — J4531 Mild persistent asthma with (acute) exacerbation: Secondary | ICD-10-CM | POA: Diagnosis not present

## 2022-07-19 DIAGNOSIS — R300 Dysuria: Secondary | ICD-10-CM | POA: Diagnosis not present

## 2022-07-19 DIAGNOSIS — R69 Illness, unspecified: Secondary | ICD-10-CM | POA: Diagnosis not present

## 2022-07-19 DIAGNOSIS — F339 Major depressive disorder, recurrent, unspecified: Secondary | ICD-10-CM | POA: Diagnosis not present

## 2022-07-19 DIAGNOSIS — R059 Cough, unspecified: Secondary | ICD-10-CM | POA: Diagnosis not present

## 2022-07-19 DIAGNOSIS — G43719 Chronic migraine without aura, intractable, without status migrainosus: Secondary | ICD-10-CM | POA: Diagnosis not present

## 2022-07-19 MED ORDER — PROAIR RESPICLICK 108 (90 BASE) MCG/ACT IN AEPB: 1.0000 | INHALATION_SPRAY | RESPIRATORY_TRACT | 3 refills | Status: DC | PRN

## 2022-07-19 MED ORDER — MONTELUKAST SODIUM 10 MG PO TABS: 10.0000 mg | ORAL_TABLET | Freq: Every day | ORAL | 5 refills | Status: DC

## 2022-07-19 MED ORDER — BUDESONIDE-FORMOTEROL FUMARATE 160-4.5 MCG/ACT IN AERO: 2.0000 | INHALATION_SPRAY | Freq: Two times a day (BID) | RESPIRATORY_TRACT | 1 refills | Status: DC | PRN

## 2022-07-19 NOTE — Patient Instructions (Addendum)
        Medications changes include :   proair, symbicort,     A referral was ordered for Urogynecology at [facility].     Someone will call you to schedule an appointment.     Return if symptoms worsen or fail to improve.

## 2022-07-19 NOTE — Assessment & Plan Note (Signed)
Acute Has asthma-recent flare/exacerbation of asthma likely related to allergies/colder weather Continue Symbicort 160-4.5 mcg per ACT 2 puffs twice daily Restart Singulair, continue Zyrtec, albuterol as needed No evidence of infection She will let me know if her symptoms do not improve or worsen Will get chest x-ray given her history of CML

## 2022-07-19 NOTE — Assessment & Plan Note (Signed)
Subacute Experiencing intermittent dysuria, urinary frequency and feeling like she needs to the bathroom even though she just went Some of these have resulted in infection, but others have not Symptoms are chronic, recurrent Referral to urogynecology

## 2022-07-20 NOTE — Telephone Encounter (Signed)
Called patient and scheduled BOTOX for 12.20.23

## 2022-07-21 ENCOUNTER — Ambulatory Visit (INDEPENDENT_AMBULATORY_CARE_PROVIDER_SITE_OTHER): Payer: BC Managed Care – PPO | Admitting: Adult Health

## 2022-07-21 ENCOUNTER — Encounter: Payer: Self-pay | Admitting: Internal Medicine

## 2022-07-21 VITALS — BP 116/71 | HR 87

## 2022-07-21 DIAGNOSIS — G43719 Chronic migraine without aura, intractable, without status migrainosus: Secondary | ICD-10-CM

## 2022-07-21 DIAGNOSIS — F332 Major depressive disorder, recurrent severe without psychotic features: Secondary | ICD-10-CM | POA: Diagnosis not present

## 2022-07-21 MED ORDER — TOPIRAMATE 25 MG PO TABS: ORAL_TABLET | ORAL | 6 refills | Status: DC

## 2022-07-21 MED ORDER — SUMATRIPTAN SUCCINATE 50 MG PO TABS: 50.0000 mg | ORAL_TABLET | ORAL | 6 refills | Status: DC | PRN

## 2022-07-21 MED ORDER — ONABOTULINUMTOXINA 200 UNITS IJ SOLR
155.0000 [IU] | Freq: Once | INTRAMUSCULAR | Status: AC
  Administered 2022-07-21: 155 [IU] via INTRAMUSCULAR
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
## 2022-07-21 NOTE — Progress Notes (Signed)
Botox- 200 units x 1 vial JEA:[id number] Expiration: 09/2024 NDC: 0148-4039-79   Bacteriostatic 0.9% Sodium Chloride- 72m total LFFK:[id number]: 04/2023 NDC: [address]-82  Dx: [id number]

## 2022-07-22 DIAGNOSIS — F332 Major depressive disorder, recurrent severe without psychotic features: Secondary | ICD-10-CM | POA: Diagnosis not present

## 2022-07-28 ENCOUNTER — Other Ambulatory Visit: Payer: Self-pay | Admitting: Adult Health

## 2022-07-28 DIAGNOSIS — F332 Major depressive disorder, recurrent severe without psychotic features: Secondary | ICD-10-CM | POA: Diagnosis not present

## 2022-07-28 MED ORDER — NARATRIPTAN HCL 2.5 MG PO TABS: 2.5000 mg | ORAL_TABLET | ORAL | 11 refills | Status: DC | PRN

## 2022-07-29 ENCOUNTER — Ambulatory Visit: Payer: BC Managed Care – PPO | Admitting: Psychiatry

## 2022-08-01 ENCOUNTER — Other Ambulatory Visit: Payer: Self-pay | Admitting: Internal Medicine

## 2022-08-01 MED ORDER — ALBUTEROL SULFATE HFA 108 (90 BASE) MCG/ACT IN AERS: 1.0000 | INHALATION_SPRAY | Freq: Four times a day (QID) | RESPIRATORY_TRACT | 5 refills | Status: DC | PRN

## 2022-08-05 ENCOUNTER — Ambulatory Visit: Payer: BC Managed Care – PPO | Admitting: Psychiatry

## 2022-08-20 DIAGNOSIS — F988 Other specified behavioral and emotional disorders with onset usually occurring in childhood and adolescence: Secondary | ICD-10-CM | POA: Diagnosis not present

## 2022-08-20 DIAGNOSIS — F419 Anxiety disorder, unspecified: Secondary | ICD-10-CM | POA: Diagnosis not present

## 2022-08-20 DIAGNOSIS — F332 Major depressive disorder, recurrent severe without psychotic features: Secondary | ICD-10-CM | POA: Diagnosis not present

## 2022-08-20 DIAGNOSIS — F4310 Post-traumatic stress disorder, unspecified: Secondary | ICD-10-CM | POA: Diagnosis not present

## 2022-08-25 DIAGNOSIS — K602 Anal fissure, unspecified: Secondary | ICD-10-CM | POA: Diagnosis not present

## 2022-08-25 DIAGNOSIS — K5902 Outlet dysfunction constipation: Secondary | ICD-10-CM | POA: Diagnosis not present

## 2022-08-26 ENCOUNTER — Encounter: Payer: Self-pay | Admitting: Internal Medicine

## 2022-08-26 DIAGNOSIS — K589 Irritable bowel syndrome without diarrhea: Secondary | ICD-10-CM | POA: Insufficient documentation

## 2022-08-26 DIAGNOSIS — M7918 Myalgia, other site: Secondary | ICD-10-CM | POA: Insufficient documentation

## 2022-08-26 NOTE — Patient Instructions (Addendum)
      Try therapeutic massage.   Go back and see the allergist for the recurrent hives/itching.   See orthopedics about your upper back/chest pain.    Make an appointment with the urogynecologist    Medications changes include :   none    Let me know if you need referrals ordered.

## 2022-08-26 NOTE — Progress Notes (Signed)
Subjective:    Patient ID: Desiree Carlson, female    DOB: Mar 10, 1996, 27 y.o.   MRN: [id number]      HPI Desiree Carlson is here for  Chief Complaint  Patient presents with   Back Pain    Low back pain and upper back pain; Feels like she being bounced back and forth between all her specialists; Pain is severe in right shoulder   She is here today with her mother.  Upper and lower back pain - sees pain management at [facility] for diffuse myofascial pain syn, back pain, generalized abd pain.  MRI did not show cause of back pain.  Has had trigger point injections.  Referred to acupuncture.  Advised cymbalta.  Advised ketamine for pain and depression.  Sees them again next month.   GI issues - saw Dr Lyndel Safe at [place] 2 days ago.  BIS-C and dyssynergic defecation, hemorrhoids, intermittent fissure. Have constipation with intermittent diarrhea when emptying out then back to baseline.  Has associated N/V, abdominal pain.  Advised 1 soft stool daily - given bowel regimen.  Also with GERD - can not take PPI.  Advised lifestyle changes. Can take gaviscon - if needed H2B (needs to take 2 hr after dasatinib)   migraines - sees neuro and get botox injections.  Last injection 07/21/22.  Neuro recommended topamax - may be able to d/c after botox takes effect.  Recommended trial of naratriptan for rescue.   States fatigue   Pain has never been this bad - not sure what to do.  Pain is diffuse.  The pain is constant 10/10 - she has it 24.7.    back pain - right upper back to right front chest.  Feels knife like pain ripping her chest open.  The pain is intense in her chest and the pain tingles down her arms.  The pain in her back is constant and when the chest pain comes it is unbearable.  She has tried PT, trigger point injections (made it worse), dry needling, naltrexone, cymbalta -- nothing has touched the pain.  Uses lidocaine patches.  Has difficulty sleeping.  Has been going on a year.  Having more neck pain  -  more of discomfort.   Lower back - bump in center lower back since BM biopsy.  Lower left back is deep and achy.     Breaking having hves daily. Whole body itches - mostly back, cehst arms but has been everywhere.  Taking benadryl at night.  Takes it prn during day saw allergy at [facility].  No obvious cause.   Takes zyrtec daily, singular daily.    She is anxious about the cause of her pain.   Medications and allergies reviewed with patient and updated if appropriate.  Current Outpatient Medications on File Prior to Visit  Medication Sig Dispense Refill   albuterol (VENTOLIN HFA) 108 (90 Base) MCG/ACT inhaler Inhale 1-2 puffs into the lungs every 6 (six) hours as needed for wheezing or shortness of breath. 6.7 g 5   ALPRAZolam (XANAX) 0.5 MG tablet TAKE 1 TABLET BY MOUTH TWICE DAILY AS NEEDED FOR ANXIETY FOR 30 DAYS     amphetamine-dextroamphetamine (ADDERALL XR) 20 MG 24 hr capsule Take 1 capsule by mouth every morning.     botulinum toxin Type A (BOTOX) 200 units injection Inject 200 Units into the muscle every 3 (three) months. 1 each 11   budesonide-formoterol (SYMBICORT) 160-4.5 MCG/ACT inhaler Inhale 2 puffs into the lungs 2 (two)  times daily as needed. 1 each 1   cetirizine (ZYRTEC) 10 MG tablet Take by mouth.     clobetasol ointment (TEMOVATE) 4.27 % Apply 1 Application topically 2 (two) times daily. Place in a thin layer of the affected areas twice a day for 2 weeks.  Then place on the affected areas twice a week at bedtime. 60 g 1   dasatinib (SPRYCEL) 70 MG tablet 1 tablet - PER [facility] onc Orally Once a day     diclofenac Sodium (VOLTAREN) 1 % GEL as needed.     DULoxetine (CYMBALTA) 60 MG capsule Take 1 capsule by mouth daily.     fluticasone (FLONASE) 50 MCG/ACT nasal spray 1 spray into each nostril daily.     ibuprofen (ADVIL) 200 MG tablet Take by mouth as needed.     ketoconazole (NIZORAL) 2 % shampoo      lamoTRIgine (LAMICTAL) 25 MG tablet Take by mouth.     lidocaine  (XYLOCAINE) 2 % solution 15 mL every three (3) hours as needed.     lidocaine (XYLOCAINE) 5 % ointment Apply 1 Application topically 3 (three) times daily. Use as needed. 1.25 g 1   linaclotide (LINZESS) 145 MCG CAPS capsule Take by mouth.     loperamide (IMODIUM) 2 MG capsule Take 1 capsule (2 mg) total by mouth once daily as needed for diarrhea.     loratadine (CLARITIN) 10 MG tablet Take by mouth.     montelukast (SINGULAIR) 10 MG tablet Take 1 tablet (10 mg total) by mouth daily. 30 tablet 5   Naltrexone HCl, Pain, 4.5 MG CAPS Take by mouth.     naratriptan (AMERGE) 2.5 MG tablet Take 1 tablet (2.5 mg total) by mouth as needed for migraine. Take one (1) tablet at onset of headache; if returns or does not resolve, may repeat after 4 hours; do not exceed five (5) mg in 24 hours. 10 tablet 11   ondansetron (ZOFRAN) 4 MG tablet Take 1 tablet by mouth up to two times a day as needed for nausea. Second choice.     pimecrolimus (ELIDEL) 1 % cream Apply topically.     prochlorperazine (COMPAZINE) 10 MG tablet Take by mouth.     SUMAtriptan (IMITREX) 50 MG tablet Take 1 tablet (50 mg total) by mouth every 2 (two) hours as needed for migraine. May repeat in 2 hours if headache persists or recurs. 10 tablet 6   topiramate (TOPAMAX) 25 MG tablet Take 25 mg (1 pill) at bedtime for one week, then increase to 50 mg (2 pills) at bedtime for one week, then take 75 mg (3 pills) at bedtime for one week, then take 100 mg (4 pills) at bedtime 120 tablet 6   triamcinolone ointment (KENALOG) 0.1 % Apply to affected areas twice a day as needed.     trimethoprim-polymyxin b (POLYTRIM) ophthalmic solution ADMINISTER 1 DROP RIGHT EYE EVERY 3 HOURS WHILE AWAKE X 7 DAYS     Vilazodone HCl (VIIBRYD) 40 MG TABS Take by mouth.     No current facility-administered medications on file prior to visit.    Review of Systems  Constitutional:  Positive for fatigue.  Gastrointestinal:  Positive for abdominal pain, constipation  and diarrhea.  Musculoskeletal:  Positive for back pain, myalgias and neck pain.  Neurological:  Positive for headaches.  Psychiatric/Behavioral:  The patient is nervous/anxious.        Objective:   Vitals:   08/27/22 0829  BP: 110/68  Pulse: 80  Temp: 98 F (36.7 C)  SpO2: 98%   BP Readings from Last 3 Encounters:  08/27/22 110/68  07/21/22 116/71  07/19/22 102/78   Wt Readings from Last 3 Encounters:  08/27/22 255 lb (115.7 kg)  07/19/22 249 lb (112.9 kg)  05/18/22 241 lb (109.3 kg)   Body mass index is 38.77 kg/m.    Physical Exam Constitutional:      General: She is not in acute distress.    Appearance: Normal appearance.  HENT:     Head: Normocephalic and atraumatic.  Eyes:     Conjunctiva/sclera: Conjunctivae normal.  Cardiovascular:     Rate and Rhythm: Normal rate and regular rhythm.     Heart sounds: Normal heart sounds. No murmur heard. Pulmonary:     Effort: Pulmonary effort is normal. No respiratory distress.     Breath sounds: Normal breath sounds. No wheezing.  Musculoskeletal:        General: Tenderness (Tenderness with palpation right upper back, wrapping around to her right chest) present. No swelling or deformity.     Cervical back: Neck supple.     Right lower leg: No edema.     Left lower leg: No edema.  Lymphadenopathy:     Cervical: No cervical adenopathy.  Skin:    General: Skin is warm and dry.     Findings: No rash.  Neurological:     Mental Status: She is alert. Mental status is at baseline.  Psychiatric:        Behavior: Behavior normal.     Comments: Anxious            Assessment & Plan:    See Problem List for Assessment and Plan of chronic medical problems.     I spent 30 minutes dedicated to the care of this patient on the date of this encounter including review of recent labs, most recent imaging (MRI of lumbar and thoracic spine, CT neck), speciality notes (GI, neurology, pain management), obtaining history,  communicating with the patient and her mother, discussing recommendations, and documenting clinical information in the EHR

## 2022-08-27 ENCOUNTER — Ambulatory Visit (INDEPENDENT_AMBULATORY_CARE_PROVIDER_SITE_OTHER): Payer: 59 | Admitting: Internal Medicine

## 2022-08-27 VITALS — BP 110/68 | HR 80 | Temp 98.0°F | Ht 68.0 in | Wt 255.0 lb

## 2022-08-27 DIAGNOSIS — L509 Urticaria, unspecified: Secondary | ICD-10-CM | POA: Diagnosis not present

## 2022-08-27 DIAGNOSIS — M7918 Myalgia, other site: Secondary | ICD-10-CM | POA: Diagnosis not present

## 2022-08-27 DIAGNOSIS — J4520 Mild intermittent asthma, uncomplicated: Secondary | ICD-10-CM

## 2022-08-27 DIAGNOSIS — K582 Mixed irritable bowel syndrome: Secondary | ICD-10-CM

## 2022-08-27 NOTE — Assessment & Plan Note (Signed)
Chronic Recurrent hives and itching throughout her body Has seen an allergist in the past, but has not been recently Taking Zyrtec daily and Singulair daily Taking Benadryl every night and sometimes during the day Not controlled Advised that she follow back up with allergy for further testing

## 2022-08-27 NOTE — Assessment & Plan Note (Signed)
Chronic Has pain everywhere, but 1 area of significant pain is her right upper back and to right chest that is likely musculoskeletal Has tried several things with pain management at [facility] without relief She is concerned about what the causes-discussed I think this is likely musculoskeletal, but recommend that she sees orthopedics if she has not had this evaluated to make sure there is not any other cause Advised considering therapeutic massage Can consider chiropractor

## 2022-08-27 NOTE — Assessment & Plan Note (Signed)
Chronic Controlled Recent exacerbation resolved Mild, intermittent Continue albuterol inhaler as needed, continue Symbicort 2 puffs twice daily as needed-does not need this on a daily basis

## 2022-09-07 ENCOUNTER — Telehealth: Payer: Self-pay | Admitting: Adult Health

## 2022-09-07 NOTE — Telephone Encounter (Signed)
Sent msg to verify insurance for 2024

## 2022-09-09 NOTE — Telephone Encounter (Signed)
Called pt. Asked her to upload her insurance card to [employer]. Pt said I will in a few minutes and she asked when her next botox appointment was.
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
## 2022-09-13 ENCOUNTER — Ambulatory Visit (INDEPENDENT_AMBULATORY_CARE_PROVIDER_SITE_OTHER): Payer: 59 | Admitting: Obstetrics and Gynecology

## 2022-09-13 ENCOUNTER — Telehealth: Payer: Self-pay | Admitting: Obstetrics and Gynecology

## 2022-09-13 ENCOUNTER — Encounter: Payer: Self-pay | Admitting: Obstetrics and Gynecology

## 2022-09-13 ENCOUNTER — Other Ambulatory Visit (HOSPITAL_COMMUNITY)
Admission: RE | Admit: 2022-09-13 | Discharge: 2022-09-13 | Disposition: A | Discharge status: Home / Self Care | Payer: 59 | Source: Ambulatory Visit | Attending: Obstetrics and Gynecology | Admitting: Obstetrics and Gynecology

## 2022-09-13 VITALS — BP 118/76 | HR 65 | Ht 69.0 in | Wt 257.0 lb

## 2022-09-13 DIAGNOSIS — R39198 Other difficulties with micturition: Secondary | ICD-10-CM | POA: Diagnosis not present

## 2022-09-13 DIAGNOSIS — N644 Mastodynia: Secondary | ICD-10-CM | POA: Diagnosis not present

## 2022-09-13 DIAGNOSIS — N760 Acute vaginitis: Secondary | ICD-10-CM | POA: Insufficient documentation

## 2022-09-13 DIAGNOSIS — N946 Dysmenorrhea, unspecified: Secondary | ICD-10-CM

## 2022-09-13 DIAGNOSIS — R102 Pelvic and perineal pain: Secondary | ICD-10-CM | POA: Diagnosis not present

## 2022-09-13 DIAGNOSIS — R300 Dysuria: Secondary | ICD-10-CM

## 2022-09-13 MED ORDER — CLOBETASOL PROPIONATE 0.05 % EX OINT: 1.0000 | TOPICAL_OINTMENT | Freq: Two times a day (BID) | CUTANEOUS | 0 refills | Status: DC

## 2022-09-13 MED ORDER — NORETHINDRONE 0.35 MG PO TABS: 1.0000 | ORAL_TABLET | Freq: Every day | ORAL | 0 refills | Status: DC

## 2022-09-13 NOTE — Patient Instructions (Signed)
Lichen Sclerosus Lichen sclerosus is a skin problem. It can happen on any part of the body, but it commonly involves the anal and genital areas. It can cause itching and discomfort in these areas. Treatment can help to control symptoms. When the genital area is affected, getting treatment is important because the condition can cause scarring that may lead to other problems if left untreated. What are the causes? The cause of this condition is not known. It may be related to an overactive immune system or a lack of certain hormones. Lichen sclerosus is not an infection or a fungus, and it is not passed from one person to another (non-contagious). What increases the risk? The following factors may make you more likely to develop this condition: You are a woman who has reached menopause. You are a man who was not circumcised. This condition may also develop for the first time in children, usually before they enter puberty. What are the signs or symptoms? Symptoms of this condition include: White areas (plaques) on the skin that may be thin and wrinkled, or thickened. Red and swollen patches (lesions) on the skin. Tears or cracks in the skin. Bruising. Blood blisters. Severe itching. Pain, itching, or burning when urinating. Constipation is also common in children with lichen sclerosus, but can be seen in adults. How is this diagnosed? This condition may be diagnosed with a physical exam. In some cases, a tissue sample may be removed to be checked under a microscope (biopsy). How is this treated? This condition may be treated with: Topical steroids. These are medicated creams or ointments that are applied over the affected areas. Medicines that are taken by mouth. Topical immunotherapy. These are medicated creams or ointments that are applied over the affected areas. They stimulate your immune system to fight the skin condition. This may be used if steroids are not effective. Surgery. This may  be needed in more severe cases that are causing problems such as scarring. Follow these instructions at home: Medicines Take over-the-counter and prescription medicines only as told by your health care provider. Use creams or ointments as told by your health care provider. Skin care Do not scratch the affected areas of skin. If you are a woman, be sure to keep the vaginal area as clean and dry as possible. Clean the affected area of skin gently with water only. Pat skin dry and avoid the use of rough towels or toilet paper. Avoid irritating skin products, including soap and scented lotions. Use emollient creams as directed by your health care provider to help reduce itching. General instructions Keep all follow-up visits. This is important. Your condition may cause constipation. To prevent or treat constipation, you may need to: Drink enough fluid to keep your urine pale yellow. Take over-the-counter or prescription medicines. Eat foods that are high in fiber, such as beans, whole grains, and fresh fruits and vegetables. Limit foods that are high in fat and processed sugars, such as fried or sweet foods. Contact a health care provider if: You have increasing redness, swelling, or pain in the affected area. You have fluid, blood, or pus coming from the affected area. You have new lesions on your skin. You have a fever. You have pain during sex. Get help right away if: You develop severe pain or burning in the affected areas, especially in the genital area. Summary Lichen sclerosus is a skin problem. When the genital area is affected, getting treatment is important because the condition can cause scarring that may  lead to other problems if left untreated. This condition is usually treated with medicated creams or ointments (topical steroids) that are applied over the affected areas. Take or use over-the-counter and prescription medicines only as told by your health care provider. Contact a  health care provider if you have new lesions on your skin, have pain during sex, or have increasing redness, swelling, or pain in the affected area. Keep all follow-up visits. This is important. This information is not intended to replace advice given to you by your health care provider. Make sure you discuss any questions you have with your health care provider. Document Revised: 12/01/2019 Document Reviewed: 12/01/2019 Elsevier Patient Education  [place].

## 2022-09-13 NOTE — Telephone Encounter (Signed)
Please make appointment for my patient to have a right breast US and possible right mammogram at the [facility].   She has right breast pain.

## 2022-09-13 NOTE — Telephone Encounter (Signed)
Please let patient know that I reviewed her chart and did not see a pelvic ultrasound.  I recommend that she return for this along with a follow up visit with me prior to a referral to a minimally invasive surgeon.  Patient also has a phone note open to schedule a right breast US.

## 2022-09-14 LAB — CERVICOVAGINAL ANCILLARY ONLY
Bacterial Vaginitis (gardnerella): NEGATIVE
Candida Glabrata: NEGATIVE
Candida Vaginitis: NEGATIVE
Comment: NEGATIVE
Comment: NEGATIVE
Comment: NEGATIVE
Comment: NEGATIVE
Trichomonas: NEGATIVE

## 2022-09-14 NOTE — Telephone Encounter (Signed)
Thank you for that follow up.  No pelvic ultrasound needed at this time.  Please make a referral to Dr. Currie Paris for surgical consultation for pelvic pain and dysmenorrhea.

## 2022-09-14 NOTE — Telephone Encounter (Signed)
Referral sent. Left DVM on machine per DPR.

## 2022-09-14 NOTE — Telephone Encounter (Signed)
Pt is scheduled for 09/23/22 @ 730. Arrival time is 715. Pt notified and voiced understanding. Will forward to [facility] for final review and close this encounter.

## 2022-09-15 LAB — URINE CULTURE
MICRO NUMBER:: 14551452
SPECIMEN QUALITY:: ADEQUATE

## 2022-09-15 LAB — URINALYSIS, COMPLETE W/RFL CULTURE
Bilirubin Urine: NEGATIVE
Glucose, UA: NEGATIVE
Hyaline Cast: NONE SEEN /LPF
Ketones, ur: NEGATIVE
Leukocyte Esterase: NEGATIVE
Nitrites, Initial: NEGATIVE
Protein, ur: NEGATIVE
Specific Gravity, Urine: 1.025 (ref 1.001–1.035)
WBC, UA: NONE SEEN /HPF (ref 0–5)
pH: 5.5 (ref 5.0–8.0)

## 2022-09-15 LAB — CULTURE INDICATED

## 2022-09-16 ENCOUNTER — Encounter: Payer: Self-pay | Admitting: Psychiatry

## 2022-09-16 ENCOUNTER — Other Ambulatory Visit (HOSPITAL_COMMUNITY): Payer: Self-pay

## 2022-09-16 NOTE — Telephone Encounter (Signed)
  Benefit Verification [id number] Submitted!

## 2022-09-16 NOTE — Telephone Encounter (Signed)
Pharmacy Patient Advocate Encounter   Received notification from [place] that prior authorization for Botox 200UNIT solution is required/requested.    PA submitted on 09/16/2022 to (ins) [employer] via CoverMyMeds Key [id number] Status is pending

## 2022-09-20 ENCOUNTER — Encounter: Payer: Self-pay | Admitting: *Deleted

## 2022-09-20 NOTE — Telephone Encounter (Signed)
Let's see if we can appeal to try a third session. We can plan to discuss other options if insurance denies the appeal or if there's no improvement after the 3rd session

## 2022-09-20 NOTE — Telephone Encounter (Signed)
Case number: [id number] PA states denied, "The requested medication and/or diagnosis are not a covered benefit and excluded from coverage in accordance with the terms and conditions of your plan benefit. Therefore, the request has been administratively denied."

## 2022-09-21 ENCOUNTER — Encounter: Payer: Self-pay | Admitting: Neurology

## 2022-09-21 NOTE — Telephone Encounter (Signed)
Received a denial letter to the appeal. [id number] the botox is excluded from her plan all together and they will not approve.

## 2022-09-21 NOTE — Telephone Encounter (Signed)
What about through her secondary insurance that she mentioned previously through [place]?

## 2022-09-21 NOTE — Telephone Encounter (Signed)
Appeal letter has been completed, signed and sent to [employer] appeals team as urgent.  Received confirmation that it went through.

## 2022-09-21 NOTE — Telephone Encounter (Signed)
Okay thank you. Per list of failed/tried medications, does not look like she has tried injectable medications yet, if she is willing to try, can place order for [facility] monthly injection for migraine prevention. Please ensure scheduled f/u next month for botox is cancelled as well. Thank you.

## 2022-09-23 ENCOUNTER — Ambulatory Visit
Admission: RE | Admit: 2022-09-23 | Discharge: 2022-09-23 | Disposition: A | Discharge status: Home / Self Care | Payer: 59 | Source: Ambulatory Visit | Attending: Obstetrics and Gynecology | Admitting: Obstetrics and Gynecology

## 2022-09-23 DIAGNOSIS — N644 Mastodynia: Secondary | ICD-10-CM

## 2022-09-23 NOTE — Telephone Encounter (Signed)
   Per Portal for medical this requires no PA-Buy and Bill

## 2022-10-05 NOTE — Telephone Encounter (Signed)
Please check on status of referral to Dr. Currie Paris.

## 2022-10-06 NOTE — Telephone Encounter (Signed)
Please contact the patient and give her this update.

## 2022-10-06 NOTE — Telephone Encounter (Signed)
FYI. Per [facility]: Called office and they stated that they plan to call patient when their May schedule opens up and they weren't able to confirm when that would be at time of call.

## 2022-10-11 ENCOUNTER — Encounter: Payer: Self-pay | Admitting: Family Medicine

## 2022-10-11 ENCOUNTER — Ambulatory Visit (INDEPENDENT_AMBULATORY_CARE_PROVIDER_SITE_OTHER): Payer: 59 | Admitting: Family Medicine

## 2022-10-11 VITALS — BP 118/70 | HR 68 | Temp 98.0°F | Resp 20 | Ht 69.0 in | Wt 251.0 lb

## 2022-10-11 DIAGNOSIS — J029 Acute pharyngitis, unspecified: Secondary | ICD-10-CM

## 2022-10-11 DIAGNOSIS — J069 Acute upper respiratory infection, unspecified: Secondary | ICD-10-CM | POA: Diagnosis not present

## 2022-10-11 DIAGNOSIS — R8290 Unspecified abnormal findings in urine: Secondary | ICD-10-CM

## 2022-10-11 DIAGNOSIS — R6889 Other general symptoms and signs: Secondary | ICD-10-CM

## 2022-10-11 LAB — POCT URINALYSIS DIPSTICK
Bilirubin, UA: NEGATIVE
Blood, UA: NEGATIVE
Glucose, UA: NEGATIVE
Ketones, UA: NEGATIVE
Nitrite, UA: NEGATIVE
Protein, UA: POSITIVE — AB
Spec Grav, UA: 1.015 (ref 1.010–1.025)
Urobilinogen, UA: NEGATIVE E.U./dL — AB
pH, UA: 5 (ref 5.0–8.0)

## 2022-10-11 LAB — POCT INFLUENZA A/B
Influenza A, POC: NEGATIVE
Influenza B, POC: NEGATIVE

## 2022-10-11 LAB — POCT RAPID STREP A (OFFICE): Rapid Strep A Screen: NEGATIVE

## 2022-10-11 MED ORDER — IPRATROPIUM BROMIDE 0.03 % NA SOLN: 2.0000 | Freq: Two times a day (BID) | NASAL | 0 refills | Status: DC

## 2022-10-11 NOTE — Patient Instructions (Signed)
Throat lozenges, chloraseptic spray, warm salt water gargles, hot tea/honey, cough syrup (Delsym), Dayquil/Nyquil, Tylenol/Ibuprofen, Vicks, and a humidifier at night.

## 2022-10-11 NOTE — Progress Notes (Signed)
Assessment & Plan:  1. Viral URI Education provided on viral URIs.  Discussed typical duration and progression of these.  Encouraged symptom management including throat lozenges, chloraseptic spray, warm salt water gargles, hot tea/honey, cough syrup (Delsym), Dayquil/Nyquil, Tylenol/Ibuprofen, Vicks, and a humidifier at night.  2. Flu-like symptoms - POCT Influenza A/B  3. Sore throat - POCT rapid strep A  4. Abnormal urine odor Reassurance provided she does not have a UTI. Encouraged to keep upcoming appointment with URO-GYN.  - POCT urinalysis dipstick  Results for orders placed or performed in visit on 10/11/22  POCT urinalysis dipstick  Result Value Ref Range   Color, UA dark yellow    Clarity, UA clear    Glucose, UA Negative Negative   Bilirubin, UA neg    Ketones, UA neg    Spec Grav, UA 1.015 1.010 - 1.025   Blood, UA neg    pH, UA 5.0 5.0 - 8.0   Protein, UA Positive (A) Negative   Urobilinogen, UA negative (A) 0.2 or 1.0 E.U./dL   Nitrite, UA neg    Leukocytes, UA Trace (A) Negative   Appearance     Odor    POCT rapid strep A  Result Value Ref Range   Rapid Strep A Screen Negative Negative  POCT Influenza A/B  Result Value Ref Range   Influenza A, POC Negative Negative   Influenza B, POC Negative Negative    Follow up plan: Return if symptoms worsen or fail to improve.  Hendricks Limes, MSN, APRN, FNP-C  Subjective:  HPI: Desiree Carlson is a 27 y.o. female presenting on 10/11/2022 for URI (Sinus, HA, fever, ST, aches - started Saturday) and Urinary Tract Infection (HX of frequent UTI (appt with uro next month) /Foamy urine with odor currently )  Patient complains of head congestion, headache, sore throat, fever, shortness of breath, wheezing, abdominal pain, swollen lymph nodes, and body aches, and fatigue . Max temp 100 last night. She denies runny nose and sneezing. Onset of symptoms was 2 days ago, gradually worsening since that time. She is drinking  plenty of fluids. Evaluation to date: none. Treatment to date: antihistamines and Nyquil, Tylenol . She has a history of asthma. She does not smoke.   Patient also complains of abnormal smelling urine and foamy urine . She has had symptoms since July of last year. Patient also complains of  having to force out remaining urine . Patient does have a history of recurrent UTI.  Patient does not have a history of pyelonephritis. She has an upcoming appointment with URO-GYN next month.   ROS: Negative unless specifically indicated above in HPI.   Relevant past medical history reviewed and updated as indicated.   Allergies and medications reviewed and updated.   Current Outpatient Medications:    albuterol (VENTOLIN HFA) 108 (90 Base) MCG/ACT inhaler, Inhale 1-2 puffs into the lungs every 6 (six) hours as needed for wheezing or shortness of breath., Disp: 6.7 g, Rfl: 5   ALPRAZolam (XANAX) 0.5 MG tablet, TAKE 1 TABLET BY MOUTH TWICE DAILY AS NEEDED FOR ANXIETY FOR 30 DAYS, Disp: , Rfl:    amphetamine-dextroamphetamine (ADDERALL XR) 20 MG 24 hr capsule, Take 1 capsule by mouth every morning., Disp: , Rfl:    budesonide-formoterol (SYMBICORT) 160-4.5 MCG/ACT inhaler, Inhale 2 puffs into the lungs 2 (two) times daily as needed., Disp: 1 each, Rfl: 1   cetirizine (ZYRTEC) 10 MG tablet, Take by mouth., Disp: , Rfl:    clobetasol  ointment (TEMOVATE) [id number] %, Apply 1 Application topically 2 (two) times daily. Place in a thin layer of the affected areas twice a day for 2 weeks.  Then place on the affected areas twice a week at bedtime., Disp: 60 g, Rfl: 0   dasatinib (SPRYCEL) 70 MG tablet, 1 tablet - PER [facility] Orally Once a day, Disp: , Rfl:    diclofenac Sodium (VOLTAREN) 1 % GEL, as needed., Disp: , Rfl:    DULoxetine (CYMBALTA) 60 MG capsule, Take 1 capsule by mouth daily., Disp: , Rfl:    fluticasone (FLONASE) 50 MCG/ACT nasal spray, 1 spray into each nostril daily., Disp: , Rfl:    ibuprofen (ADVIL)  200 MG tablet, Take by mouth as needed., Disp: , Rfl:    loperamide (IMODIUM) 2 MG capsule, Take 1 capsule (2 mg) total by mouth once daily as needed for diarrhea., Disp: , Rfl:    loratadine (CLARITIN) 10 MG tablet, Take by mouth., Disp: , Rfl:    montelukast (SINGULAIR) 10 MG tablet, Take 1 tablet (10 mg total) by mouth daily., Disp: 30 tablet, Rfl: 5   naratriptan (AMERGE) 2.5 MG tablet, Take 1 tablet (2.5 mg total) by mouth as needed for migraine. Take one (1) tablet at onset of headache; if returns or does not resolve, may repeat after 4 hours; do not exceed five (5) mg in 24 hours., Disp: 10 tablet, Rfl: 11   ondansetron (ZOFRAN) 4 MG tablet, Take 1 tablet by mouth up to two times a day as needed for nausea. Second choice., Disp: , Rfl:    prochlorperazine (COMPAZINE) 10 MG tablet, Take by mouth., Disp: , Rfl:    topiramate (TOPAMAX) 25 MG tablet, Take 25 mg (1 pill) at bedtime for one week, then increase to 50 mg (2 pills) at bedtime for one week, then take 75 mg (3 pills) at bedtime for one week, then take 100 mg (4 pills) at bedtime, Disp: 120 tablet, Rfl: 6   botulinum toxin Type A (BOTOX) 200 units injection, Inject 200 Units into the muscle every 3 (three) months. (Patient not taking: Reported on 10/11/2022), Disp: 1 each, Rfl: 11   ketoconazole (NIZORAL) 2 % shampoo, , Disp: , Rfl:    lidocaine (XYLOCAINE) 2 % solution, 15 mL every three (3) hours as needed. (Patient not taking: Reported on 10/11/2022), Disp: , Rfl:    lidocaine (XYLOCAINE) 5 % ointment, Apply 1 Application topically 3 (three) times daily. Use as needed. (Patient not taking: Reported on 10/11/2022), Disp: 1.25 g, Rfl: 1   linaclotide (LINZESS) 145 MCG CAPS capsule, Take by mouth. (Patient not taking: Reported on 10/11/2022), Disp: , Rfl:    norethindrone (ORTHO MICRONOR) 0.35 MG tablet, Take 1 tablet (0.35 mg total) by mouth daily. (Patient not taking: Reported on 10/11/2022), Disp: 84 tablet, Rfl: 0  Allergies  Allergen  Reactions   Bupropion Other (See Comments)    Skin was burning Skin was burning Other reaction(s): Other (See Comments) Skin was burning  Other reaction(s): body felt like it was burning, Unknown Other reaction(s): body felt like it was burning    Cefdinir Other (See Comments)    Other reaction(s): upset stomach, felt really bad Other reaction(s): upset stomach, felt really bad    Vortioxetine Nausea And Vomiting    Objective:   BP 118/70   Pulse 68   Temp 98 F (36.7 C)   Resp 20   Ht '5\' 9"'$  (1.753 m)   Wt 251 lb (113.9 kg)  LMP 09/09/2022   BMI 37.07 kg/m    Physical Exam Vitals reviewed.  Constitutional:      General: She is not in acute distress.    Appearance: Normal appearance. She is not ill-appearing, toxic-appearing or diaphoretic.  HENT:     Head: Normocephalic and atraumatic.     Right Ear: Tympanic membrane, ear canal and external ear normal. There is no impacted cerumen.     Left Ear: Tympanic membrane, ear canal and external ear normal. There is no impacted cerumen.     Nose: Congestion present. No rhinorrhea.     Right Sinus: No maxillary sinus tenderness or frontal sinus tenderness.     Left Sinus: No maxillary sinus tenderness or frontal sinus tenderness.     Mouth/Throat:     Mouth: Mucous membranes are moist.     Pharynx: Oropharynx is clear. No oropharyngeal exudate or posterior oropharyngeal erythema.  Eyes:     General: No scleral icterus.       Right eye: No discharge.        Left eye: No discharge.     Conjunctiva/sclera: Conjunctivae normal.  Cardiovascular:     Rate and Rhythm: Normal rate and regular rhythm.     Heart sounds: Normal heart sounds. No murmur heard.    No friction rub. No gallop.  Pulmonary:     Effort: Pulmonary effort is normal. No respiratory distress.     Breath sounds: Normal breath sounds. No stridor. No wheezing, rhonchi or rales.  Musculoskeletal:        General: Normal range of motion.     Cervical back:  Normal range of motion.  Lymphadenopathy:     Cervical: Cervical adenopathy present.  Skin:    General: Skin is warm and dry.     Capillary Refill: Capillary refill takes less than 2 seconds.  Neurological:     General: No focal deficit present.     Mental Status: She is alert and oriented to person, place, and time. Mental status is at baseline.  Psychiatric:        Mood and Affect: Mood normal.        Behavior: Behavior normal.        Thought Content: Thought content normal.        Judgment: Judgment normal.

## 2022-10-14 ENCOUNTER — Ambulatory Visit: Payer: BC Managed Care – PPO | Admitting: Psychiatry

## 2022-10-14 ENCOUNTER — Ambulatory Visit: Payer: BC Managed Care – PPO | Admitting: Adult Health

## 2022-10-19 ENCOUNTER — Ambulatory Visit: Payer: BC Managed Care – PPO | Admitting: Adult Health

## 2022-10-19 ENCOUNTER — Telehealth: Payer: Self-pay | Admitting: Adult Health

## 2022-10-19 NOTE — Telephone Encounter (Signed)
LVM and sent mychart msg informing pt of appt cx- Janett Billow out.

## 2022-10-19 NOTE — Progress Notes (Deleted)
Botox- 200 units x 1 vial Lot: [id number] Expiration: 12/2024 NDC: [id number]   Bacteriostatic 0.9% Sodium Chloride- 4 mL total Lot: [id number] Expiration: 11/12/2022 NDC: [id number]   Dx: [id number]  B/B Witnessed by Gerline Legacy, RN

## 2022-10-20 DIAGNOSIS — M546 Pain in thoracic spine: Secondary | ICD-10-CM | POA: Diagnosis not present

## 2022-10-20 DIAGNOSIS — M545 Low back pain, unspecified: Secondary | ICD-10-CM | POA: Diagnosis not present

## 2022-10-20 DIAGNOSIS — G894 Chronic pain syndrome: Secondary | ICD-10-CM | POA: Diagnosis not present

## 2022-10-20 DIAGNOSIS — M792 Neuralgia and neuritis, unspecified: Secondary | ICD-10-CM | POA: Diagnosis not present

## 2022-10-20 DIAGNOSIS — M25511 Pain in right shoulder: Secondary | ICD-10-CM | POA: Diagnosis not present

## 2022-11-02 ENCOUNTER — Telehealth: Payer: Self-pay | Admitting: Psychiatry
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
## 2022-11-02 NOTE — Telephone Encounter (Signed)
Tried to call pt. No answer, can't lvm.

## 2022-11-02 NOTE — Telephone Encounter (Signed)
Pt is requesting a call back from the nurse to go over side effects she has been having that she believes to be from Botox. Please give her a call at [phone].

## 2022-11-03 ENCOUNTER — Ambulatory Visit: Payer: BC Managed Care – PPO | Admitting: Adult Health

## 2022-11-05 NOTE — Telephone Encounter (Signed)
Attempted to contact [facility], Dr. Rogelia Mire office. No one available, LVMTCB.

## 2022-11-07 NOTE — Telephone Encounter (Signed)
I recommend referral to another provider.  I would recommend Dr. Leda Quail.

## 2022-11-08 ENCOUNTER — Ambulatory Visit: Payer: BC Managed Care – PPO | Admitting: Adult Health

## 2022-11-08 NOTE — Telephone Encounter (Signed)
Called Dr. Ajewole's office who stated referrals are put on hold until at least June; have no available appts. 

## 2022-11-08 NOTE — Telephone Encounter (Signed)
[facility]arena Chancy [facility] closes early on Fridays. Can you contact their office to schedule and update referral. Referral was placed 09/14/22.

## 2022-11-08 NOTE — Telephone Encounter (Signed)
Patient made aware of new referral sent to Dr. Hyacinth Meeker.

## 2022-11-08 NOTE — Telephone Encounter (Signed)
New referral placed to Dr. Hyacinth Meeker.   Desiree Carlson -please notify patient and schedule.

## 2022-11-10 ENCOUNTER — Encounter: Payer: Self-pay | Admitting: Obstetrics and Gynecology

## 2022-11-10 ENCOUNTER — Ambulatory Visit (INDEPENDENT_AMBULATORY_CARE_PROVIDER_SITE_OTHER): Payer: 59 | Admitting: Obstetrics and Gynecology

## 2022-11-10 VITALS — BP 125/79 | HR 109 | Ht 67.5 in | Wt 255.0 lb

## 2022-11-10 DIAGNOSIS — M62838 Other muscle spasm: Secondary | ICD-10-CM | POA: Diagnosis not present

## 2022-11-10 DIAGNOSIS — N3281 Overactive bladder: Secondary | ICD-10-CM | POA: Diagnosis not present

## 2022-11-10 DIAGNOSIS — R350 Frequency of micturition: Secondary | ICD-10-CM

## 2022-11-10 DIAGNOSIS — K5904 Chronic idiopathic constipation: Secondary | ICD-10-CM

## 2022-11-10 DIAGNOSIS — R159 Full incontinence of feces: Secondary | ICD-10-CM

## 2022-11-10 LAB — POCT URINALYSIS DIPSTICK
Bilirubin, UA: NEGATIVE
Blood, UA: NEGATIVE
Glucose, UA: NEGATIVE
Ketones, UA: NEGATIVE
Leukocytes, UA: NEGATIVE
Nitrite, UA: NEGATIVE
Protein, UA: NEGATIVE
Spec Grav, UA: >=1.03 — AB (ref 1.010–1.025)
Urobilinogen, UA: 0.2 E.U./dL
pH, UA: 6 (ref 5.0–8.0)

## 2022-11-10 MED ORDER — MIRABEGRON ER 25 MG PO TB24
25.0000 mg | ORAL_TABLET | Freq: Every day | ORAL | 5 refills | Status: DC
Start: 2022-11-10 — End: 2022-12-20

## 2022-11-10 MED ORDER — DIAZEPAM 5 MG PO TABS
ORAL_TABLET | ORAL | 1 refills | Status: DC
Start: 2022-11-10 — End: 2023-04-11

## 2022-11-10 NOTE — Patient Instructions (Addendum)
Today we talked about ways to manage bladder urgency such as altering your diet to avoid irritative beverages and foods (bladder diet) as well as attempting to decrease stress and other exacerbating factors.    Decrease water intake to around 35ml per day.   The Most Bothersome Foods* The Least Bothersome Foods*  Coffee - Regular & Decaf Tea - caffeinated Carbonated beverages - cola, non-colas, diet & caffeine-free Alcohols - Beer, Red Wine, White Wine, [address] - Grapefruit, [place], Orange, [employer] - Cranberry, Grapefruit, Orange, Pineapple Vegetables - Tomato & Tomato Products Flavor Enhancers - Hot peppers, Spicy foods, Chili, Horseradish, Vinegar, Monosodium glutamate (MSG) Artificial Sweeteners - NutraSweet, Sweet 'N Low, Equal (sweetener), Saccharin Ethnic foods - [place], [place], [place] food [employer] - low-fat & whole Fruits - Bananas, Blueberries, Honeydew melon, Pears, Raisins, Watermelon Vegetables - Broccoli, [address] Sprouts, [place], Carrots, Cauliflower, [place], Cucumber, Mushrooms, Peas, Radishes, Squash, Zucchini, White potatoes, Sweet potatoes & yams Poultry - Chicken, Eggs, [place], [employer] - Beef, Diplomatic Services operational officer, Lamb Seafood - Shrimp, Chickasaw fish, Salmon Grains - Oat, Rice Snacks - Pretzels, Popcorn  *Lenward Chancellor et al. Diet and its role in interstitial cystitis/bladder pain syndrome (IC/BPS) and comorbid conditions. [employer]. BJU Int. 2012 Jan 11.   We discussed the symptoms of overactive bladder (OAB), which include urinary urgency, urinary frequency, night-time urination, with or without urge incontinence.  We discussed management including behavioral therapy (decreasing bladder irritants by following a bladder diet, urge suppression strategies, timed voids, bladder retraining), physical therapy, medication; and for refractory cases posterior tibial nerve stimulation, sacral neuromodulation, and intravesical botulinum toxin injection.   I will  prescribe valium 5 mg pills to place vaginally up to 2 times a day for vaginal muscle spasms. Start at night and take one every night for the next several weeks to see if it improves your symptoms. Once you are improving you can taper off the medication and just use as needed. If the medication makes you drowsy then only use at bedtime and/or we can reduce the dose. Do not use gel to place it, as that will prevent the tablet from dissolving.  Instead place 1-2 drops of water on the table before inserting it in the vagina. If the pills do not dissolve well we can switch to a special compounded suppository. Let me know how you are doing on the medication and if you have any questions.     Accidental Bowel Leakage: Our goal is to achieve formed bowel movements daily or every-other-day without leakage.  You may need to try different combinations of the following options to find what works best for you.  Some management options include: Dietary changes (more leafy greens, vegetables and fruits; less processed foods) Fiber supplementation (Metamucil or something with psyllium as active ingredient)
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
## 2022-11-16 ENCOUNTER — Ambulatory Visit (INDEPENDENT_AMBULATORY_CARE_PROVIDER_SITE_OTHER): Payer: 59 | Admitting: Obstetrics and Gynecology

## 2022-11-16 ENCOUNTER — Encounter: Payer: Self-pay | Admitting: Obstetrics and Gynecology

## 2022-11-16 ENCOUNTER — Other Ambulatory Visit (HOSPITAL_COMMUNITY)
Admission: RE | Admit: 2022-11-16 | Discharge: 2022-11-16 | Disposition: A | Discharge status: Home / Self Care | Payer: 59 | Source: Ambulatory Visit | Attending: Obstetrics and Gynecology | Admitting: Obstetrics and Gynecology

## 2022-11-16 ENCOUNTER — Telehealth: Payer: Self-pay | Admitting: Obstetrics and Gynecology

## 2022-11-16 VITALS — BP 124/80 | HR 93 | Ht 69.0 in | Wt 240.0 lb

## 2022-11-16 DIAGNOSIS — N946 Dysmenorrhea, unspecified: Secondary | ICD-10-CM

## 2022-11-16 DIAGNOSIS — B372 Candidiasis of skin and nail: Secondary | ICD-10-CM

## 2022-11-16 DIAGNOSIS — L900 Lichen sclerosus et atrophicus: Secondary | ICD-10-CM

## 2022-11-16 DIAGNOSIS — Z01419 Encounter for gynecological examination (general) (routine) without abnormal findings: Secondary | ICD-10-CM

## 2022-11-16 DIAGNOSIS — R102 Pelvic and perineal pain: Secondary | ICD-10-CM | POA: Diagnosis not present

## 2022-11-16 DIAGNOSIS — R39198 Other difficulties with micturition: Secondary | ICD-10-CM

## 2022-11-16 DIAGNOSIS — Z113 Encounter for screening for infections with a predominantly sexual mode of transmission: Secondary | ICD-10-CM | POA: Insufficient documentation

## 2022-11-16 DIAGNOSIS — Z711 Person with feared health complaint in whom no diagnosis is made: Secondary | ICD-10-CM

## 2022-11-16 DIAGNOSIS — Z114 Encounter for screening for human immunodeficiency virus [HIV]: Secondary | ICD-10-CM

## 2022-11-16 DIAGNOSIS — Z1159 Encounter for screening for other viral diseases: Secondary | ICD-10-CM

## 2022-11-16 LAB — URINALYSIS, COMPLETE W/RFL CULTURE
Glucose, UA: NEGATIVE
Protein, ur: NEGATIVE
Specific Gravity, Urine: 1.025 (ref 1.001–1.035)
pH: 6 (ref 5.0–8.0)

## 2022-11-16 MED ORDER — NORETHINDRONE 0.35 MG PO TABS: 1.0000 | ORAL_TABLET | Freq: Every day | ORAL | 11 refills | Status: DC

## 2022-11-16 MED ORDER — NYSTATIN 100000 UNIT/GM EX POWD: 1.0000 | Freq: Three times a day (TID) | CUTANEOUS | 2 refills | Status: DC

## 2022-11-16 MED ORDER — NYSTATIN 100000 UNIT/GM EX OINT: 1.0000 | TOPICAL_OINTMENT | Freq: Two times a day (BID) | CUTANEOUS | 1 refills | Status: DC

## 2022-11-16 MED ORDER — CLOBETASOL PROPIONATE 0.05 % EX OINT: 1.0000 | TOPICAL_OINTMENT | Freq: Two times a day (BID) | CUTANEOUS | 0 refills | Status: DC

## 2022-11-16 NOTE — Telephone Encounter (Signed)
Please cancel referral to Dr. Leda Quail.   Patient has laparoscopic surgery scheduled with [facility] at [facility].

## 2022-11-16 NOTE — Patient Instructions (Signed)

## 2022-11-17 LAB — URINE CULTURE
MICRO NUMBER:: 14831045
Result:: NO GROWTH
SPECIMEN QUALITY:: ADEQUATE

## 2022-11-17 LAB — URINALYSIS, COMPLETE W/RFL CULTURE
Bilirubin Urine: NEGATIVE
Hyaline Cast: NONE SEEN /LPF
Ketones, ur: NEGATIVE
Leukocyte Esterase: NEGATIVE
Nitrites, Initial: NEGATIVE

## 2022-11-17 LAB — CERVICOVAGINAL ANCILLARY ONLY
Chlamydia: NEGATIVE
Comment: NEGATIVE
Comment: NEGATIVE
Comment: NORMAL
Neisseria Gonorrhea: NEGATIVE
Trichomonas: NEGATIVE

## 2022-11-17 LAB — PREGNANCY, URINE: Preg Test, Ur: NEGATIVE

## 2022-11-17 LAB — HEPATITIS C ANTIBODY: Hepatitis C Ab: NONREACTIVE

## 2022-11-17 LAB — CULTURE INDICATED

## 2022-11-17 LAB — HIV ANTIBODY (ROUTINE TESTING W REFLEX): HIV 1&2 Ab, 4th Generation: NONREACTIVE

## 2022-11-17 LAB — RPR: RPR Ser Ql: NONREACTIVE

## 2022-11-17 NOTE — Telephone Encounter (Signed)
Referral closed.   Routing to [employer].   Encounter closed.

## 2022-11-17 NOTE — Telephone Encounter (Signed)
See telephone encounter dated 11/16/22.   Encounter closed.

## 2022-11-19 ENCOUNTER — Ambulatory Visit (INDEPENDENT_AMBULATORY_CARE_PROVIDER_SITE_OTHER): Payer: 59 | Admitting: Nurse Practitioner

## 2022-11-19 VITALS — BP 110/74 | HR 79 | Temp 98.5°F | Ht 69.0 in | Wt 252.4 lb

## 2022-11-19 DIAGNOSIS — H539 Unspecified visual disturbance: Secondary | ICD-10-CM | POA: Diagnosis not present

## 2022-11-19 NOTE — Progress Notes (Signed)
   Established Patient Office Visit  Subjective   Patient ID: Desiree Carlson, female    DOB: 1995/12/10  Age: 27 y.o. MRN: [id number]  Chief Complaint  Patient presents with   Eye Pain    Swollen Right eye, pain with every movement, when blinking there is a shutter sound to it, itchy, floater of black, white and red.    Patient arrives for acute visit for the above.  Reports that she is experiencing chalazions and styes more frequently recently.  Approximately 2-3 days ago upper right eyelid started swelling, and she started noticing discharge.  She did have pinkeye per her report about 1 month ago and had polymyxin trimethoprim drops left over so she started administering these.  Symptoms have not improved.  And now she is experiencing blurry vision that does not improve with blinking, floaters, and flashing lights in her vision.  Is not established with an ophthalmologist as of yet.  Does report as a teenager saw an ophthalmologist, but has not followed up due to not needing to.    ROS: See HPI    Objective:     BP 110/74   Pulse 79   Temp 98.5 F (36.9 C) (Temporal)   Ht  (1.753 m)   Wt 252 lb 6 oz (114.5 kg)   LMP 11/10/2022   SpO2 97%   BMI 37.27 kg/m    Physical Exam Constitutional:      General: She is not in acute distress.    Appearance: Normal appearance.  HENT:     Head: Normocephalic and atraumatic.  Eyes:     General:        Right eye: Discharge present.        Left eye: No foreign body, discharge or hordeolum.     Conjunctiva/sclera:     Right eye: Right conjunctiva is not injected. No hemorrhage.    Left eye: Left conjunctiva is not injected. No hemorrhage.    Comments: Swollen and red upper eyelid  Pulmonary:     Effort: Pulmonary effort is normal.  Neurological:     General: No focal deficit present.     Mental Status: She is alert.  Psychiatric:        Mood and Affect: Mood normal.        Behavior: Behavior normal.        Thought  Content: Thought content normal.        Judgment: Judgment normal.      No results found for any visits on 11/19/22.    The ASCVD Risk score (Arnett DK, et al., 2019) failed to calculate for the following reasons:   The 2019 ASCVD risk score is only valid for ages 11 to 61    Assessment & Plan:   Problem List Items Addressed This Visit       Other   Visual changes - Primary    Etiology unclear, sounds consistent with acute bacterial conjunctivitis. However, patient is having significant visual changes that sound more complicated than simple conjunctivitis. I have recommended urgent/emergent evaluation with an ophthalmologist and have ordered referral. Patient also told to call around to ophthalmologists in the area and if she can not get in to see one today then she should proceed to the ER.       Relevant Orders   Ambulatory referral to Ophthalmology    Return if symptoms worsen or fail to improve.    Elenore Paddy, NP

## 2022-11-19 NOTE — Assessment & Plan Note (Signed)
Etiology unclear, sounds consistent with acute bacterial conjunctivitis. However, patient is having significant visual changes that sound more complicated than simple conjunctivitis. I have recommended urgent/emergent evaluation with an ophthalmologist and have ordered referral. Patient also told to call around to ophthalmologists in the area and if she can not get in to see one today then she should proceed to the ER.

## 2022-12-10 HISTORY — PX: OTHER SURGICAL HISTORY: SHX169

## 2022-12-14 ENCOUNTER — Emergency Department (HOSPITAL_COMMUNITY): Payer: 59

## 2022-12-14 ENCOUNTER — Other Ambulatory Visit: Payer: Self-pay

## 2022-12-14 ENCOUNTER — Emergency Department (HOSPITAL_COMMUNITY)
Admission: EM | Admit: 2022-12-14 | Discharge: 2022-12-14 | Disposition: A | Discharge status: Home / Self Care | Payer: 59 | Attending: Emergency Medicine | Admitting: Emergency Medicine

## 2022-12-14 DIAGNOSIS — G8918 Other acute postprocedural pain: Secondary | ICD-10-CM | POA: Diagnosis not present

## 2022-12-14 DIAGNOSIS — R509 Fever, unspecified: Secondary | ICD-10-CM | POA: Insufficient documentation

## 2022-12-14 DIAGNOSIS — R059 Cough, unspecified: Secondary | ICD-10-CM | POA: Insufficient documentation

## 2022-12-14 DIAGNOSIS — N938 Other specified abnormal uterine and vaginal bleeding: Secondary | ICD-10-CM | POA: Insufficient documentation

## 2022-12-14 DIAGNOSIS — R1032 Left lower quadrant pain: Secondary | ICD-10-CM | POA: Diagnosis present

## 2022-12-14 LAB — CBC
HCT: 37.3 % (ref 36.0–46.0)
Hemoglobin: 11.8 g/dL — ABNORMAL LOW (ref 12.0–15.0)
MCH: 28 pg (ref 26.0–34.0)
MCHC: 31.6 g/dL (ref 30.0–36.0)
MCV: 88.4 fL (ref 80.0–100.0)
Platelets: 326 10*3/uL (ref 150–400)
RBC: 4.22 MIL/uL (ref 3.87–5.11)
RDW: 13.9 % (ref 11.5–15.5)
WBC: 9.7 10*3/uL (ref 4.0–10.5)
nRBC: 0 % (ref 0.0–0.2)

## 2022-12-14 LAB — URINALYSIS, ROUTINE W REFLEX MICROSCOPIC
Bilirubin Urine: NEGATIVE
Glucose, UA: NEGATIVE mg/dL
Ketones, ur: NEGATIVE mg/dL
Leukocytes,Ua: NEGATIVE
Nitrite: NEGATIVE
Protein, ur: NEGATIVE mg/dL
Specific Gravity, Urine: 1.008 (ref 1.005–1.030)
pH: 8 (ref 5.0–8.0)

## 2022-12-14 LAB — COMPREHENSIVE METABOLIC PANEL
ALT: 24 U/L (ref 0–44)
AST: 24 U/L (ref 15–41)
Albumin: 3.5 g/dL (ref 3.5–5.0)
Alkaline Phosphatase: 67 U/L (ref 38–126)
Anion gap: 10 (ref 5–15)
BUN: 11 mg/dL (ref 6–20)
CO2: 24 mmol/L (ref 22–32)
Calcium: 8.8 mg/dL — ABNORMAL LOW (ref 8.9–10.3)
Chloride: 103 mmol/L (ref 98–111)
Creatinine, Ser: 0.83 mg/dL (ref 0.44–1.00)
GFR, Estimated: >60 mL/min (ref >60)
Glucose, Bld: 103 mg/dL — ABNORMAL HIGH (ref 70–99)
Potassium: 3.8 mmol/L (ref 3.5–5.1)
Sodium: 137 mmol/L (ref 135–145)
Total Bilirubin: 0.2 mg/dL — ABNORMAL LOW (ref 0.3–1.2)
Total Protein: 6.7 g/dL (ref 6.5–8.1)

## 2022-12-14 LAB — I-STAT BETA HCG BLOOD, ED (MC, WL, AP ONLY): I-stat hCG, quantitative: <5 m[IU]/mL (ref <5)

## 2022-12-14 LAB — LIPASE, BLOOD: Lipase: 33 U/L (ref 11–51)

## 2022-12-14 MED ORDER — OXYCODONE HCL 5 MG PO TABS: 5.0000 mg | ORAL_TABLET | Freq: Once | ORAL | Status: DC

## 2022-12-14 MED ORDER — HYDROMORPHONE HCL 1 MG/ML IJ SOLN
1.0000 mg | Freq: Once | INTRAMUSCULAR | Status: AC
  Administered 2022-12-14: 1 mg via INTRAVENOUS
  Filled 2022-12-14: qty 1

## 2022-12-14 MED ORDER — OXYCODONE HCL 5 MG PO CAPS: 5.0000 mg | ORAL_CAPSULE | Freq: Four times a day (QID) | ORAL | 0 refills | Status: DC | PRN

## 2022-12-14 MED ORDER — SODIUM CHLORIDE 0.9 % IV BOLUS
1000.0000 mL | Freq: Once | INTRAVENOUS | Status: AC
  Administered 2022-12-14: 1000 mL via INTRAVENOUS

## 2022-12-14 MED ORDER — IOHEXOL 350 MG/ML SOLN
75.0000 mL | Freq: Once | INTRAVENOUS | Status: AC | PRN
  Administered 2022-12-14: 75 mL via INTRAVENOUS

## 2022-12-14 MED ORDER — ONDANSETRON HCL 4 MG/2ML IJ SOLN
4.0000 mg | Freq: Once | INTRAMUSCULAR | Status: AC
  Administered 2022-12-14: 4 mg via INTRAVENOUS
  Filled 2022-12-14: qty 2

## 2022-12-14 NOTE — ED Provider Notes (Signed)
EMERGENCY DEPARTMENT AT [facility] Provider Note   CSN: [id number] Arrival date & time: 12/14/22  1253     History  Chief Complaint  Patient presents with   [name]    Desiree Carlson is a 27 y.o. female.  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  She has been having ongoing abdominal pain for a while along with rectal bleeding.  She is seeing GI and is now following with gynecology at [facility].  She underwent laparoscopy Friday for same and they found extensive endometriosis and took multiple biopsies.  She was discharged with Tylenol ibuprofen oxycodone.  Pain has been intolerable worse with any type of movement.  She has had some low-grade fevers and chills.  Minimal cough and sore throat.  Nausea no vomiting.  She has had some vaginal bleeding but she just darted her period prior to the surgery.  There is been some dysuria and foul smell with urination.  She talked to [facility] who recommended she come to the emergency room.  The history is provided by the patient.  Abdominal Pain Pain location:  LLQ and suprapubic Pain quality: aching and stabbing   Pain radiates to:  Back Pain severity:  Severe Onset quality:  Gradual Duration:  5 days Timing:  Constant Progression:  Unchanged Chronicity:  New Context: previous surgery   Relieved by:  Nothing Worsened by:  Movement and palpation Ineffective treatments:  NSAIDs and acetaminophen (narcotics) Associated symptoms: chills, cough, dysuria, fever, nausea, shortness of breath and vaginal bleeding   Associated symptoms: no chest pain, no constipation, no diarrhea, no hematemesis and no vomiting        Home Medications Prior to Admission medications   Medication Sig Start Date End Date Taking? Authorizing Provider  albuterol (VENTOLIN HFA) 108 (90 Base) MCG/ACT inhaler Inhale 1-2 puffs into the lungs every 6 (six) hours as needed for wheezing or shortness of breath. 08/01/22   Burns, Bobette Mo, MD   ALPRAZolam (XANAX) 0.5 MG tablet TAKE 1 TABLET BY MOUTH TWICE DAILY AS NEEDED FOR ANXIETY FOR 30 DAYS 03/16/21   [provider]  amphetamine-dextroamphetamine (ADDERALL XR) 20 MG 24 hr capsule Take 1 capsule by mouth every morning. 08/20/22   [provider]  botulinum toxin Type A (BOTOX) 200 units injection Inject 200 Units into the muscle every 3 (three) months. 04/08/22   Ocie Doyne, MD  budesonide-formoterol (SYMBICORT) 160-4.5 MCG/ACT inhaler Inhale 2 puffs into the lungs 2 (two) times daily as needed. 07/19/22   Pincus Sanes, MD  cetirizine (ZYRTEC) 10 MG tablet Take by mouth. 04/16/22 04/16/23  [provider]  clobetasol ointment (TEMOVATE) 0.05 % Apply 1 Application topically 2 (two) times daily. Place in a thin layer of the affected areas twice a day for 2 weeks.  Then place on the affected areas twice a week at bedtime. 11/16/22   Patton Salles, MD  dasatinib (SPRYCEL) 70 MG tablet 1 tablet - PER [facility] onc Orally Once a day 11/25/21   [provider]  diazepam (VALIUM) 5 MG tablet Place 1 tablet vaginally nightly as needed for muscle spasm/ pelvic pain. 11/10/22   Marguerita Beards, MD  diclofenac Sodium (VOLTAREN) 1 % GEL as needed. 05/11/21   [provider]  DULoxetine (CYMBALTA) 60 MG capsule Take 1 capsule by mouth daily. Patient not taking: Reported on 11/19/2022 07/13/22 07/13/23  [provider]  fluticasone (FLONASE) 50 MCG/ACT nasal spray 1  spray into each nostril daily. 04/23/21   [provider]  ibuprofen (ADVIL) 200 MG tablet Take by mouth as needed.    [provider]  lidocaine (XYLOCAINE) 2 % solution  04/26/21   [provider]  loperamide (IMODIUM) 2 MG capsule Take 1 capsule (2 mg) total by mouth once daily as needed for diarrhea. 04/23/21   [provider]  loratadine (CLARITIN) 10 MG tablet Take by mouth.    [provider]  mirabegron ER (MYRBETRIQ) 25 MG  TB24 tablet Take 1 tablet (25 mg total) by mouth daily. 11/10/22   Marguerita Beards, MD  montelukast (SINGULAIR) 10 MG tablet Take 1 tablet (10 mg total) by mouth daily. 07/19/22   Pincus Sanes, MD  naratriptan (AMERGE) 2.5 MG tablet Take 1 tablet (2.5 mg total) by mouth as needed for migraine. Take one (1) tablet at onset of headache; if returns or does not resolve, may repeat after 4 hours; do not exceed five (5) mg in 24 hours. 07/28/22   Ihor Austin, NP  norethindrone (ORTHO MICRONOR) 0.35 MG tablet Take 1 tablet (0.35 mg total) by mouth daily. 11/16/22   Patton Salles, MD  nystatin (MYCOSTATIN/NYSTOP) powder Apply 1 Application topically 3 (three) times daily. Apply to affected area for up to 7 days 11/16/22   Patton Salles, MD  nystatin ointment (MYCOSTATIN) Apply 1 Application topically 2 (two) times daily. Apply to affected area for up to 7 days. 11/16/22   Patton Salles, MD  ondansetron (ZOFRAN) 4 MG tablet Take 1 tablet by mouth up to two times a day as needed for nausea. Second choice. 05/11/21   [provider]  prochlorperazine (COMPAZINE) 10 MG tablet Take by mouth. 04/23/21   [provider]  topiramate (TOPAMAX) 25 MG tablet Take 25 mg (1 pill) at bedtime for one week, then increase to 50 mg (2 pills) at bedtime for one week, then take 75 mg (3 pills) at bedtime for one week, then take 100 mg (4 pills) at bedtime 07/21/22   Ihor Austin, NP      Allergies    Bupropion, Cefdinir, and Vortioxetine    Review of Systems   Review of Systems  Constitutional:  Positive for chills and fever.  Respiratory:  Positive for cough and shortness of breath.   Cardiovascular:  Negative for chest pain.  Gastrointestinal:  Positive for abdominal pain and nausea. Negative for constipation, diarrhea, hematemesis and vomiting.  Genitourinary:  Positive for dysuria and vaginal bleeding.    Physical Exam Updated Vital Signs BP (!) 149/87    Pulse 76   Temp 98.6 F (37 C) (Oral)   Resp (!) 24   LMP 11/10/2022   SpO2 100%  Physical Exam Vitals and nursing note reviewed.  Constitutional:      General: She is not in acute distress.    Appearance: Normal appearance. She is well-developed. She is obese.  HENT:     Head: Normocephalic and atraumatic.  Eyes:     Conjunctiva/sclera: Conjunctivae normal.  Cardiovascular:     Rate and Rhythm: Normal rate and regular rhythm.     Heart sounds: No murmur heard. Pulmonary:     Effort: Pulmonary effort is normal. No respiratory distress.     Breath sounds: Normal breath sounds.  Abdominal:     Palpations: Abdomen is soft.     Tenderness: There is abdominal tenderness. There is no guarding or rebound.  Hernia: No hernia is present.     Comments: She has multiple port incisions that are clean dry intact without surrounding erythema.  No gross hernias.  She has diffuse lower abdominal tenderness without masses guarding or rebound.  Musculoskeletal:        General: No deformity.     Cervical back: Neck supple.     Comments: Left lower extremity in a walking boot  Skin:    General: Skin is warm and dry.     Capillary Refill: Capillary refill takes less than 2 seconds.  Neurological:     General: No focal deficit present.     Mental Status: She is alert.     ED Results / Procedures / Treatments   Labs (all labs ordered are listed, but only abnormal results are displayed) Labs Reviewed  COMPREHENSIVE METABOLIC PANEL - Abnormal; Notable for the following components:      Result Value   Glucose, Bld 103 (*)    Calcium 8.8 (*)    Total Bilirubin 0.2 (*)    All other components within normal limits  CBC - Abnormal; Notable for the following components:   Hemoglobin 11.8 (*)    All other components within normal limits  URINALYSIS, ROUTINE W REFLEX MICROSCOPIC - Abnormal; Notable for the following components:   Color, Urine STRAW (*)    Hgb urine dipstick MODERATE (*)     Bacteria, UA RARE (*)    All other components within normal limits  LIPASE, BLOOD  I-STAT BETA HCG BLOOD, ED ([facility], [facility], [facility] ONLY)    EKG None  Radiology CT ABDOMEN PELVIS W CONTRAST  Result Date: 12/14/2022 CLINICAL DATA:  Postop abdominal pain. Laparoscopy for endometriosis last Friday with subsequent vaginal bleeding, hematuria, painful urination, and dyspnea on exertion. EXAM: CT ABDOMEN AND PELVIS WITH CONTRAST TECHNIQUE: Multidetector CT imaging of the abdomen and pelvis was performed using the standard protocol following bolus administration of intravenous contrast. RADIATION DOSE REDUCTION: This exam was performed according to the departmental dose-optimization program which includes automated exposure control, adjustment of the mA and/or kV according to patient size and/or use of iterative reconstruction technique. CONTRAST:  75mL OMNIPAQUE IOHEXOL 350 MG/ML SOLN COMPARISON:  CT abdomen and pelvis 03/17/2021 FINDINGS: Lower chest: No acute abnormality. Hepatobiliary: Mild hepatic steatosis. Unremarkable gallbladder and biliary tree. Pancreas: Unremarkable. Spleen: Unremarkable. Adrenals/Urinary Tract: Normal adrenal glands. No urinary calculi or hydronephrosis. Unremarkable bladder. Stomach/Bowel: Normal caliber large and small bowel. No bowel wall thickening. Normal appendix. Unremarkable stomach. Vascular/Lymphatic: No significant vascular findings are present. No enlarged abdominal or pelvic lymph nodes. Reproductive: Uterus and bilateral adnexa are unremarkable. Other: Small amount of free fluid about both adnexa and in the pelvis. There are a few small locules of free air about the left adnexa and left wall of the bladder likely due to recent laparoscopy for endometriosis. Musculoskeletal: No acute osseous abnormality. IMPRESSION: 1. Small locules of free air about the left adnexa and left wall of the bladder likely due to recent laparoscopy for endometriosis. 2. Small volume free fluid about  the adnexa and in the pelvis also likely postoperative. 3. Mild hepatic steatosis. Electronically Signed   By: Minerva Fester M.D.   On: 12/14/2022 19:15    Procedures Procedures    Medications Ordered in ED Medications  HYDROmorphone (DILAUDID) injection 1 mg (1 mg Intravenous Given 12/14/22 1546)  sodium chloride 0.9 % bolus 1,000 mL (0 mLs Intravenous Stopped 12/14/22 1645)  ondansetron (ZOFRAN) injection 4 mg (4 mg Intravenous Given  12/14/22 1546)  HYDROmorphone (DILAUDID) injection 1 mg (1 mg Intravenous Given 12/14/22 1817)  iohexol (OMNIPAQUE) 350 MG/ML injection 75 mL (75 mLs Intravenous Contrast Given 12/14/22 1858)  HYDROmorphone (DILAUDID) injection 1 mg (1 mg Intravenous Given 12/14/22 2052)  ondansetron (ZOFRAN) injection 4 mg (4 mg Intravenous Given 12/14/22 2045)    ED Course/ Medical Decision Making/ A&P Clinical Course as of 12/15/22 1654  Tue Dec 14, 2022  2037 Reached out to [facility] gynecology.  Discussed with Dr. Roanna Epley.  I reviewed the CT report with her along with her lab work exam and complaints.  She felt although his findings were consistent with postoperative normal course and she does not feel she needs transfer at this time.  She will reach out to patient's primary surgery team and see if they can get her back in the clinic soon. [MB]    Clinical Course User Index [MB] Terrilee Files, MD                             Medical Decision Making Amount and/or Complexity of Data Reviewed Labs: ordered. Radiology: ordered.  Risk Prescription drug management.   This patient complains of abdominal pain nausea urinary symptoms; this involves an extensive number of treatment Options and is a complaint that carries with it a high risk of complications and morbidity. The differential includes postoperative pain, perforation, abscess, obstruction  I ordered, reviewed and interpreted labs, which included CBC with normal white count stable hemoglobin, chemistries  unremarkable, urinalysis without signs of infection, pregnancy negative I ordered medication IV fluids IV pain medication nausea medication and reviewed PMP when indicated. I ordered imaging studies which included CT abdomen and pelvis and I independently    visualized and interpreted imaging which showed postoperative changes no definite abscess Additional history obtained from patient's mother and father Previous records obtained and reviewed in epic including operative notes and discharge summary I consulted gynecology Dr. Lauralee Evener and discussed lab and imaging findings and discussed disposition.  Cardiac monitoring reviewed, normal sinus rhythm Social determinants considered, no significant barriers Critical Interventions: None  After the interventions stated above, I reevaluated the patient and found patient's pain to be improved and tolerating p.o. Admission and further testing considered, no indications for admission at this time.  She will need close follow-up with her gynecology team at [facility].  Patient in agreement with plan for discharge and close outpatient follow-up.  Return instructions discussed         Final Clinical Impression(s) / ED Diagnoses Final diagnoses:  Post-operative pain    Rx / DC Orders ED Discharge Orders     None         Terrilee Files, MD 12/15/22 1656

## 2022-12-14 NOTE — Discharge Instructions (Signed)
You were seen in the emergency department for worsening abdominal pain after your laparoscopic surgery.  You had blood work urinalysis and a CAT scan that did not show any significant findings other than postoperative changes.  Your gynecology team should be reaching out to you for close follow-up.  We are represcribing a short course of some pain medication.  Please be aware that this may make you nauseous and can lead to constipation.  Return to the emergency department if any high fevers or worsening symptoms.

## 2022-12-14 NOTE — ED Triage Notes (Signed)
Pt reports that last Friday she had an laparoscopy for endometriosis and since then has been having vaginal bleeding, hematuria, painful urination, polyuria, and DOE. Pt denies fevers.

## 2022-12-15 ENCOUNTER — Telehealth: Payer: Self-pay

## 2022-12-15 NOTE — Transitions of Care (Post Inpatient/ED Visit) (Signed)
12/15/2022  Name: KARRIE BONN MRN: [id number] DOB: Mar 25, 1996  Today's TOC FU Call Status: Today's TOC FU Call Status:: Successful TOC FU Call Competed TOC FU Call Complete Date: 12/15/22  Transition Care Management Follow-up Telephone Call Date of Discharge: 12/14/22 Discharge Facility: [facility]) Type of Discharge: Emergency Department Reason for ED Visit: Other: (post procedural pain) How have you been since you were released from the hospital?: Better Any questions or concerns?: No  Items Reviewed: Did you receive and understand the discharge instructions provided?: Yes Medications obtained,verified, and reconciled?: Yes (Medications Reviewed) Any new allergies since your discharge?: No Dietary orders reviewed?: Yes Do you have support at home?: Yes People in Home: parent(s)  Medications Reviewed Today: Medications Reviewed Today     Reviewed by Karena Addison, LPN (Licensed Practical Nurse) on 12/15/22 at 1226  Med List Status: <None>   Medication Order Taking? Sig Documenting Provider Last Dose Status Informant  albuterol (VENTOLIN HFA) 108 (90 Base) MCG/ACT inhaler [id number] Yes Inhale 1-2 puffs into the lungs every 6 (six) hours as needed for wheezing or shortness of breath. Pincus Sanes, MD Taking Active   ALPRAZolam Prudy Feeler) 0.5 MG tablet [id number] Yes TAKE 1 TABLET BY MOUTH TWICE DAILY AS NEEDED FOR ANXIETY FOR 30 DAYS [provider] Taking Active   amphetamine-dextroamphetamine (ADDERALL XR) 20 MG 24 hr capsule [id number] Yes Take 1 capsule by mouth every morning. [provider] Taking Active   botulinum toxin Type A (BOTOX) 200 units injection [id number] Yes Inject 200 Units into the muscle every 3 (three) months. Ocie Doyne, MD Taking Active   budesonide-formoterol [facility]) 160-4.5 MCG/ACT inhaler [id number] Yes Inhale 2 puffs into the lungs 2 (two) times daily as needed. Pincus Sanes, MD Taking Active   cetirizine (ZYRTEC) 10  MG tablet [id number] Yes Take by mouth. [provider] Taking Active   clobetasol ointment (TEMOVATE) 0.05 % [id number] Yes Apply 1 Application topically 2 (two) times daily. Place in a thin layer of the affected areas twice a day for 2 weeks.  Then place on the affected areas twice a week at bedtime. Patton Salles, MD Taking Active   dasatinib [facility]) 70 MG tablet [id number] Yes 1 tablet - PER [facility] Orally Once a day [provider] Taking Active   diazepam (VALIUM) 5 MG tablet [id number] Yes Place 1 tablet vaginally nightly as needed for muscle spasm/ pelvic pain. Marguerita Beards, MD Taking Active   diclofenac Sodium (VOLTAREN) 1 % GEL [id number] Yes as needed. [provider] Taking Active   DULoxetine (CYMBALTA) 60 MG capsule [id number] Yes Take 1 capsule by mouth daily. [provider] Taking Active   fluticasone (FLONASE) 50 MCG/ACT nasal spray [id number] Yes 1 spray into each nostril daily. [provider] Taking Active   ibuprofen (ADVIL) 200 MG tablet [id number] Yes Take by mouth as needed. [provider] Taking Active   lidocaine (XYLOCAINE) 2 % solution [id number] Yes  [provider] Taking Active   loperamide (IMODIUM) 2 MG capsule [id number] Yes Take 1 capsule (2 mg) total by mouth once daily as needed for diarrhea. [provider] Taking Active   loratadine (CLARITIN) 10 MG tablet [id number] Yes Take by mouth. [provider] Taking Active   mirabegron ER (MYRBETRIQ) 25 MG TB24 tablet [id number] Yes Take 1 tablet (25 mg total) by mouth daily. Marguerita Beards, MD Taking Active   montelukast (SINGULAIR) 10 MG tablet [id number] Yes Take 1 tablet (10 mg  total) by mouth daily. Pincus Sanes, MD Taking Active   naratriptan [facility]) 2.5 MG tablet [id number] Yes Take 1 tablet (2.5 mg total) by mouth as needed for migraine. Take one (1) tablet at onset of headache; if returns or does not resolve, may  repeat after 4 hours; do not exceed five (5) mg in 24 hours. Ihor Austin, NP Taking Active   norethindrone (ORTHO MICRONOR) 0.35 MG tablet [id number] Yes Take 1 tablet (0.35 mg total) by mouth daily. Patton Salles, MD Taking Active   nystatin (MYCOSTATIN/NYSTOP) powder [id number] Yes Apply 1 Application topically 3 (three) times daily. Apply to affected area for up to 7 days Patton Salles, MD Taking Active   nystatin ointment (MYCOSTATIN) [id number] Yes Apply 1 Application topically 2 (two) times daily. Apply to affected area for up to 7 days. Patton Salles, MD Taking Active   ondansetron [facility]) 4 MG tablet [id number] Yes Take 1 tablet by mouth up to two times a day as needed for nausea. Second choice. [provider] Taking Active   oxycodone (OXY-IR) 5 MG capsule [id number] Yes Take 1 capsule (5 mg total) by mouth every 6 (six) hours as needed for pain. Terrilee Files, MD Taking Active   prochlorperazine (COMPAZINE) 10 MG tablet [id number] Yes Take by mouth. [provider] Taking Active   topiramate (TOPAMAX) 25 MG tablet [id number] Yes Take 25 mg (1 pill) at bedtime for one week, then increase to 50 mg (2 pills) at bedtime for one week, then take 75 mg (3 pills) at bedtime for one week, then take 100 mg (4 pills) at bedtime Ihor Austin, NP Taking Active   Med List Note [place], [facility] 03/30/21 1355): Sprycel filled through [facility]            Home Care and Equipment/Supplies: Were Home Health Services Ordered?: NA Any new equipment or medical supplies ordered?: NA  Functional Questionnaire: Do you need assistance with bathing/showering or dressing?: No Do you need assistance with meal preparation?: No Do you need assistance with eating?: No Do you have difficulty maintaining continence: No Do you need assistance with getting out of bed/getting out of a chair/moving?: No Do you have difficulty managing or  taking your medications?: No  Follow up appointments reviewed: PCP Follow-up appointment confirmed?: NA Specialist Hospital Follow-up appointment confirmed?: Yes Date of Specialist follow-up appointment?: 12/30/22 Follow-Up Specialty Provider:: surgeon Do you need transportation to your follow-up appointment?: No Do you understand care options if your condition(s) worsen?: Yes-patient verbalized understanding    SIGNATURE Karena Addison, LPN [facility] Nurse Health Advisor Direct Dial [phone]

## 2022-12-16 DIAGNOSIS — C9210 Chronic myeloid leukemia, BCR/ABL-positive, not having achieved remission: Secondary | ICD-10-CM | POA: Diagnosis not present

## 2022-12-20 ENCOUNTER — Other Ambulatory Visit: Payer: Self-pay | Admitting: Obstetrics and Gynecology

## 2022-12-20 DIAGNOSIS — R140 Abdominal distension (gaseous): Secondary | ICD-10-CM | POA: Insufficient documentation

## 2022-12-20 DIAGNOSIS — K120 Recurrent oral aphthae: Secondary | ICD-10-CM | POA: Insufficient documentation

## 2022-12-20 DIAGNOSIS — K5909 Other constipation: Secondary | ICD-10-CM | POA: Insufficient documentation

## 2022-12-20 DIAGNOSIS — N3281 Overactive bladder: Secondary | ICD-10-CM

## 2022-12-20 DIAGNOSIS — R1033 Periumbilical pain: Secondary | ICD-10-CM | POA: Insufficient documentation

## 2022-12-20 DIAGNOSIS — K625 Hemorrhage of anus and rectum: Secondary | ICD-10-CM | POA: Insufficient documentation

## 2022-12-20 MED ORDER — MIRABEGRON ER 25 MG PO TB24: 25.0000 mg | ORAL_TABLET | Freq: Every day | ORAL | 5 refills | Status: DC

## 2022-12-20 NOTE — Progress Notes (Signed)
Patient's prescription was originally denied. She has a significant history of IBS-C and is not a good candidate for anticholinergic medications. New prescription sent in to attempt another prior authorization for the medication.
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
## 2022-12-28 ENCOUNTER — Encounter: Payer: Self-pay | Admitting: Obstetrics and Gynecology

## 2022-12-28 ENCOUNTER — Ambulatory Visit (INDEPENDENT_AMBULATORY_CARE_PROVIDER_SITE_OTHER): Payer: 59 | Admitting: Obstetrics and Gynecology

## 2022-12-28 VITALS — BP 128/80 | HR 89

## 2022-12-28 DIAGNOSIS — R159 Full incontinence of feces: Secondary | ICD-10-CM

## 2022-12-28 DIAGNOSIS — N3281 Overactive bladder: Secondary | ICD-10-CM | POA: Diagnosis not present

## 2022-12-28 DIAGNOSIS — K5904 Chronic idiopathic constipation: Secondary | ICD-10-CM

## 2022-12-28 DIAGNOSIS — R350 Frequency of micturition: Secondary | ICD-10-CM

## 2022-12-28 DIAGNOSIS — M62838 Other muscle spasm: Secondary | ICD-10-CM

## 2022-12-28 LAB — POCT URINALYSIS DIPSTICK
Bilirubin, UA: NEGATIVE
Blood, UA: NEGATIVE
Glucose, UA: NEGATIVE
Ketones, UA: NEGATIVE
Leukocytes, UA: NEGATIVE
Nitrite, UA: NEGATIVE
Protein, UA: POSITIVE — AB
Spec Grav, UA: >=1.03 — AB (ref 1.010–1.025)
Urobilinogen, UA: 0.2 E.U./dL
pH, UA: 6.5 (ref 5.0–8.0)

## 2022-12-28 MED ORDER — MIRABEGRON ER 50 MG PO TB24: 50.0000 mg | ORAL_TABLET | Freq: Every day | ORAL | 5 refills | Status: DC

## 2022-12-28 NOTE — Patient Instructions (Addendum)
Constipation: Our goal is to achieve formed bowel movements daily or every-other-day.  You may need to try different combinations of the following options to find what works best for you - everybody's body works differently so feel free to adjust the dosages as needed.  Some options to help maintain bowel health include:  Dietary changes (more leafy greens, vegetables and fruits; less processed foods) Fiber supplementation (Benefiber, FiberCon, Metamucil or Psyllium). Start slow and increase gradually to full dose. Over-the-counter agents such as: stool softeners (Docusate or Colace) and/or laxatives (Miralax, milk of magnesia)  "Power Pudding" is a natural mixture that may help your constipation.  To make blend 1 cup applesauce, 1 cup wheat bran, and 3/4 cup prune juice, refrigerate and then take 1 tablespoon daily with a large glass of water as needed.   Eulis Foster for PT  Increase Myrbetriq to 50mg 

## 2022-12-29 ENCOUNTER — Telehealth: Payer: Self-pay | Admitting: Adult Health

## 2022-12-29 NOTE — Telephone Encounter (Signed)
Initiated new auth via [employer] portal, pt was approved for buy/bill:  [id number] (12/29/22-12/29/23)
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
## 2023-01-06 ENCOUNTER — Encounter: Payer: 59 | Attending: Obstetrics and Gynecology | Admitting: Physical Therapy

## 2023-01-06 ENCOUNTER — Other Ambulatory Visit: Payer: Self-pay

## 2023-01-06 ENCOUNTER — Encounter: Payer: Self-pay | Admitting: Physical Therapy

## 2023-01-06 DIAGNOSIS — M6281 Muscle weakness (generalized): Secondary | ICD-10-CM | POA: Diagnosis present

## 2023-01-06 DIAGNOSIS — M62838 Other muscle spasm: Secondary | ICD-10-CM | POA: Insufficient documentation

## 2023-01-06 DIAGNOSIS — M5459 Other low back pain: Secondary | ICD-10-CM | POA: Diagnosis present

## 2023-01-06 DIAGNOSIS — M6289 Other specified disorders of muscle: Secondary | ICD-10-CM | POA: Diagnosis present

## 2023-01-06 DIAGNOSIS — R102 Pelvic and perineal pain: Secondary | ICD-10-CM | POA: Diagnosis present
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
## 2023-01-12 ENCOUNTER — Encounter: Payer: 59 | Admitting: Obstetrics and Gynecology

## 2023-01-13 ENCOUNTER — Encounter: Payer: Self-pay | Admitting: Physical Therapy

## 2023-01-13 ENCOUNTER — Encounter: Payer: 59 | Admitting: Physical Therapy

## 2023-01-13 DIAGNOSIS — M62838 Other muscle spasm: Secondary | ICD-10-CM

## 2023-01-13 DIAGNOSIS — M6281 Muscle weakness (generalized): Secondary | ICD-10-CM

## 2023-01-13 DIAGNOSIS — M5459 Other low back pain: Secondary | ICD-10-CM

## 2023-01-13 DIAGNOSIS — R102 Pelvic and perineal pain: Secondary | ICD-10-CM

## 2023-01-13 DIAGNOSIS — M6289 Other specified disorders of muscle: Secondary | ICD-10-CM
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
## 2023-01-15 ENCOUNTER — Other Ambulatory Visit: Payer: Self-pay | Admitting: Internal Medicine

## 2023-01-19 ENCOUNTER — Encounter: Payer: Self-pay | Admitting: Physical Therapy

## 2023-01-19 ENCOUNTER — Ambulatory Visit: Payer: 59 | Attending: Obstetrics and Gynecology | Admitting: Physical Therapy

## 2023-01-19 DIAGNOSIS — R102 Pelvic and perineal pain: Secondary | ICD-10-CM

## 2023-01-19 DIAGNOSIS — M5459 Other low back pain: Secondary | ICD-10-CM

## 2023-01-19 DIAGNOSIS — M6289 Other specified disorders of muscle: Secondary | ICD-10-CM | POA: Diagnosis present

## 2023-01-19 DIAGNOSIS — M6281 Muscle weakness (generalized): Secondary | ICD-10-CM

## 2023-01-19 DIAGNOSIS — M62838 Other muscle spasm: Secondary | ICD-10-CM | POA: Diagnosis present
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
## 2023-01-24 ENCOUNTER — Ambulatory Visit: Payer: 59 | Admitting: Family Medicine
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
## 2023-01-24 NOTE — Progress Notes (Unsigned)
[facility] Urodynamics Procedure  Referring Physician: Pincus Sanes, MD Date of Procedure: 01/25/2023  Desiree Carlson is a 27 y.o. female who presents for urodynamic evaluation. Indication(s) for study: UUI and OAB  Vital Signs: There were no vitals taken for this visit.  Laboratory Results: A {clean catch/ ZOXW:96045} urine specimen revealed:  POC urine:    Voiding Diary: The patient voided {NUMBERS; 1-20:18551} times per day. Voided volumes ranged from *** mL to *** mL, with an average of *** mL.  Intervals between voids ranged from *** hr to ***hr with an average interval between voids of *** hr.  Intake per day ranged from ***mL to ***mL.  Output ranged from ***mL to ***mL.  She had *** incontinence episodes per day (*** UUI, *** SUI) and *** episodes of nocturia.  She drinks: *** oz of caffeinated beverages, *** oz of juice, and *** oz of alcohol per day.  Procedure Timeout:  The correct patient was verified and the correct procedure was verified. The patient was in the correct position and safety precautions were reviewed based on at the patient's history.  Urodynamic Procedure A 24F dual lumen urodynamics catheter was placed under sterile conditions into the patient's bladder. A 24F catheter was placed into the {vagina/ rectum:24786} in order to measure abdominal pressure. EMG patches were placed in the appropriate position.  All connections were confirmed and calibrations/adjusted made. Saline was instilled into the bladder through the dual lumen catheters.  Cough/valsalva pressures were measured periodically during filling.  Patient was allowed to void.  The bladder was then emptied of its residual.  UROFLOW: Revealed a Qmax of *** mL/sec.  She voided *** mL and had a residual of *** mL.  It was a {uroflow:24789} pattern and represented normal habits ***though interpretation limited due to low voided volume.***  CMG: This was performed with sterile water in the  sitting position at a fill rate of 30*** mL/min.    First sensation of fullness was *** mLs,  First urge was *** mLs,  Strong urge was *** mLs and  Capacity was *** mLs  Stress incontinence {WAS/WAS NOT:[phone]::"was not"} demonstrated {highest/ lowest:24787} {Desc; negative/positive:13464} CLPP was *** cmH20 at *** ml. {highest/ lowest:24787} {Desc; negative/positive:13464} VLPP was *** cmH20at *** ml. {highest/ lowest:24787} {Desc; negative/positive:13464} Barrier CLPP was *** cmH20 at *** ml. {highest/ lowest:24787} {Desc; negative/positive:13464} Barrier VLPP was *** cmH20 at *** ml.  Detrusor function was {Desc; normal/underactive/overactive:13463}, {With/no:32556} phasic contractions seen.  The first occurred at *** mL to *** cm of water and {WAS/WAS NOT:[phone]::"was not"} associated with {urge/ WUJW:11914}.  Compliance:  ***. End fill detrusor pressure was ***cmH20.  Calculated compliance was ***[id number]  UPP: MUCP {With/without:5700} barrier reduction was *** cm of water.    MICTURITION STUDY: Voiding was performed {reduction:24791} in the sitting position.  Pdet at Qmax was *** cm of water.  Qmax was *** mL/sec.  It was a {uroflow:24789} pattern.  She voided *** mL and had a residual of *** mL.  It was a volitional void, sustained detrusor contraction was {DESC; PRESENT/NOT PRESENT:21021351} and abdominal straining was {DESC; PRESENT/NOT PRESENT:21021351}  EMG: This was performed with patches.  She had voluntary contractions, recruitment with fill was {DESC; PRESENT/NOT PRESENT:21021351} and urethral sphincter was {relaxed:24792} with void.  The details of the procedure with the study tracings have been scanned into EPIC.   Urodynamic Impression:  1. Sensation was {DESC; NORMAL/REDUCED/ABSENT/INCREASED:23133}; capacity was {DESC; NORMAL/REDUCED/ABSENT/INCREASED:23133} 2. Stress Incontinence {WAS/WAS NOT:[phone]::"was not"} demonstrated at {ISD:24793} pressures; 3.  Detrusor Overactivity {WAS/WAS NOT:[id number]::"was not"} demonstrated {With-without:32421} leakage. 4. Emptying was {dysfunctional:24794} with a {elevated:24795} PVR, a sustained detrusor contraction {DESC; PRESENT/NOT PRESENT:21021351},  abdominal straining {DESC; PRESENT/NOT PRESENT:21021351}, {dyssynergic:24796} urethral sphincter activity on EMG.  Plan: - The patient will follow up  to discuss the findings and treatment options.

## 2023-01-25 ENCOUNTER — Encounter: Payer: Self-pay | Admitting: Obstetrics and Gynecology

## 2023-01-25 ENCOUNTER — Ambulatory Visit (INDEPENDENT_AMBULATORY_CARE_PROVIDER_SITE_OTHER): Payer: 59 | Admitting: Obstetrics and Gynecology

## 2023-01-25 VITALS — BP 106/68 | HR 64

## 2023-01-25 DIAGNOSIS — R350 Frequency of micturition: Secondary | ICD-10-CM | POA: Diagnosis not present

## 2023-01-25 DIAGNOSIS — N3281 Overactive bladder: Secondary | ICD-10-CM | POA: Diagnosis not present

## 2023-01-25 LAB — POCT URINALYSIS DIPSTICK
Bilirubin, UA: NEGATIVE
Blood, UA: NEGATIVE
Glucose, UA: NEGATIVE
Ketones, UA: NEGATIVE
Leukocytes, UA: NEGATIVE
Nitrite, UA: NEGATIVE
Protein, UA: NEGATIVE
Spec Grav, UA: 1.015 (ref 1.010–1.025)
Urobilinogen, UA: 0.2 E.U./dL
pH, UA: 6 (ref 5.0–8.0)

## 2023-01-25 MED ORDER — PHENAZOPYRIDINE HCL 95 MG PO TABS
95.0000 mg | ORAL_TABLET | Freq: Three times a day (TID) | ORAL | 1 refills | Status: DC | PRN
Start: 2023-01-25 — End: 2023-03-08

## 2023-01-25 NOTE — Patient Instructions (Signed)

## 2023-01-26 ENCOUNTER — Encounter: Payer: Self-pay | Admitting: Family Medicine

## 2023-01-26 ENCOUNTER — Ambulatory Visit (INDEPENDENT_AMBULATORY_CARE_PROVIDER_SITE_OTHER): Payer: 59 | Admitting: Family Medicine

## 2023-01-26 DIAGNOSIS — G43719 Chronic migraine without aura, intractable, without status migrainosus: Secondary | ICD-10-CM

## 2023-01-26 MED ORDER — ONABOTULINUMTOXINA 200 UNITS IJ SOLR
155.0000 [IU] | Freq: Once | INTRAMUSCULAR | Status: AC
Start: 2023-01-26 — End: 2023-01-26
  Administered 2023-01-26: 155 [IU] via INTRAMUSCULAR

## 2023-01-26 NOTE — Progress Notes (Signed)
Botox- 200 units x 1 vial Lot: [id number] Expiration: 03/2025 NDC: 4540-9811-91  Bacteriostatic 0.9% Sodium Chloride- * mL  Lot: [id number] Expiration: 11/01/2023 NDC: 6213-0865-78  Dx: I69.629 B/B Witnessed by Marcelina Morel, RN

## 2023-02-01 ENCOUNTER — Encounter: Payer: Self-pay | Admitting: Physical Therapy

## 2023-02-01 ENCOUNTER — Encounter: Payer: 59 | Attending: Obstetrics and Gynecology | Admitting: Physical Therapy

## 2023-02-01 DIAGNOSIS — M6281 Muscle weakness (generalized): Secondary | ICD-10-CM | POA: Insufficient documentation

## 2023-02-01 DIAGNOSIS — M62838 Other muscle spasm: Secondary | ICD-10-CM | POA: Insufficient documentation

## 2023-02-01 DIAGNOSIS — R102 Pelvic and perineal pain: Secondary | ICD-10-CM | POA: Diagnosis present

## 2023-02-01 DIAGNOSIS — M6289 Other specified disorders of muscle: Secondary | ICD-10-CM | POA: Diagnosis present

## 2023-02-01 DIAGNOSIS — M5459 Other low back pain: Secondary | ICD-10-CM | POA: Diagnosis present
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
## 2023-02-18 ENCOUNTER — Encounter: Payer: Self-pay | Admitting: Physical Therapy

## 2023-02-18 ENCOUNTER — Ambulatory Visit: Payer: 59 | Attending: Obstetrics and Gynecology | Admitting: Physical Therapy

## 2023-02-18 DIAGNOSIS — R102 Pelvic and perineal pain: Secondary | ICD-10-CM | POA: Diagnosis present

## 2023-02-18 DIAGNOSIS — M6289 Other specified disorders of muscle: Secondary | ICD-10-CM | POA: Diagnosis present

## 2023-02-18 DIAGNOSIS — M6281 Muscle weakness (generalized): Secondary | ICD-10-CM | POA: Diagnosis present

## 2023-02-18 DIAGNOSIS — M62838 Other muscle spasm: Secondary | ICD-10-CM | POA: Diagnosis present

## 2023-02-18 DIAGNOSIS — M5459 Other low back pain: Secondary | ICD-10-CM | POA: Diagnosis present
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
## 2023-02-28 ENCOUNTER — Encounter: Payer: Self-pay | Admitting: Physical Therapy

## 2023-02-28 ENCOUNTER — Ambulatory Visit: Payer: 59 | Admitting: Physical Therapy

## 2023-02-28 DIAGNOSIS — R102 Pelvic and perineal pain: Secondary | ICD-10-CM

## 2023-02-28 DIAGNOSIS — M62838 Other muscle spasm: Secondary | ICD-10-CM

## 2023-02-28 DIAGNOSIS — M6281 Muscle weakness (generalized): Secondary | ICD-10-CM

## 2023-02-28 DIAGNOSIS — M6289 Other specified disorders of muscle: Secondary | ICD-10-CM

## 2023-02-28 DIAGNOSIS — M5459 Other low back pain: Secondary | ICD-10-CM
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
## 2023-03-01 ENCOUNTER — Encounter: Payer: Self-pay | Admitting: Physical Therapy

## 2023-03-08 ENCOUNTER — Encounter: Payer: Self-pay | Admitting: Psychiatry

## 2023-03-08 ENCOUNTER — Ambulatory Visit (INDEPENDENT_AMBULATORY_CARE_PROVIDER_SITE_OTHER): Payer: 59 | Admitting: Psychiatry

## 2023-03-08 ENCOUNTER — Ambulatory Visit: Payer: 59 | Admitting: Adult Health

## 2023-03-08 ENCOUNTER — Telehealth: Payer: Self-pay | Admitting: Psychiatry

## 2023-03-08 VITALS — BP 109/68 | HR 67 | Ht 67.0 in | Wt 245.2 lb

## 2023-03-08 DIAGNOSIS — R519 Headache, unspecified: Secondary | ICD-10-CM

## 2023-03-08 DIAGNOSIS — C9210 Chronic myeloid leukemia, BCR/ABL-positive, not having achieved remission: Secondary | ICD-10-CM | POA: Diagnosis not present

## 2023-03-08 DIAGNOSIS — G43719 Chronic migraine without aura, intractable, without status migrainosus: Secondary | ICD-10-CM

## 2023-03-08 MED ORDER — SUMATRIPTAN SUCCINATE 100 MG PO TABS: 100.0000 mg | ORAL_TABLET | ORAL | 6 refills | Status: DC | PRN

## 2023-03-08 NOTE — Telephone Encounter (Signed)
[employer] Berkley Harvey: [id number] exp. 03/08/23-04/22/23, [facility] [place] [phone] In Progress, Anticipated Determination Date: 03/16/2023 sent to GI [phone]

## 2023-03-08 NOTE — Progress Notes (Signed)
CC:  headaches  Follow-up Visit  Last visit: 01/25/22 for Botox  Brief HPI: 27 year old female with a history of asthma, CML who follows in clinic for chronic migraines. 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 CTA head 04/02/22 was unremarkable.  Interval History: Migraines have increased in frequency. She currently has 5-7 migraines per week. Headaches are primarily behind her right eye. They can last 4-12 hours at a time. She will sometimes wake up with a swollen red eye, which lasts for a few days at a time.Will feel like there is sand in her eye as well. She has had 3 sessions of Botox so far and has not noticed much improvement. She is currently not taking anything for migraine rescue. Felt Imitrex 50 mg worked "okay", but only took the edge off of her headaches and never resolved them.   She was just prescribed amitriptyline by her Psychiatrist but has not started it yet.  She was recently found to have swollen lymph nodes in her neck, which are likely going to be biopsied.  Current Headache Regimen: Preventative: Botox Abortive: none   Prior Therapies                                  Rescue: Fioricet Imitrex 50 mg PRN - takes 1-2 hours to take effect Maxalt 10 mg PRN - lack of efficacy Zofran  Prevention: Propranolol 20 mg BID Amitriptyline 50 mg at bedtime Cymbalta 60 mg daily Botox  Physical Exam:   Vital Signs: BP 109/68 (BP Location: Left Arm, Patient Position: Sitting, Cuff Size: Normal)   Pulse 67   Ht 5\' 7"  (1.702 m)   Wt 245 lb 3.2 oz (111.2 kg)   BMI 38.40 kg/m  GENERAL:  well appearing, in no acute distress, alert  SKIN:  Color, texture, turgor normal. No rashes or lesions HEAD:  Normocephalic/atraumatic. RESP: normal respiratory effort MSK:  No gross joint deformities.   NEUROLOGICAL: Mental Status: Alert, oriented to person, place and time, Follows commands, and Speech fluent and appropriate. Cranial  Nerves: PERRL, decreased sensation left V1-3, face symmetric, no dysarthria, hearing grossly intact Motor: moves all extremities equally Gait: normal-based.  IMPRESSION: 27 year old female with a history of asthma and CML who presents for follow up of migraines. Her headaches have worsened despite treatment. Will order updated MRI given worsening of headaches in the setting of CML. Will increase Imitrex to 100 mg for migraine rescue. Discussed preventive options, and for now will wait to see if her headaches respond to amitriptyline before adding any more medications. She would like to try an occipital nerve block, will have her return to office for this. She is requesting a referral to [facility] for transfer of care as I will be leaving the practice later this year.  PLAN: -MRI brain -Stop Botox. Will hold off on adding new preventive at this time as she was recently prescribed amitriptyline and would like to see if this helps her headaches -Start Imitrex 100 mg PRN -Return to office for occipital nerve block -Referral to [facility] to establish care for migraines  Follow-up: for occipital nerve block  I spent a total of 40 minutes on the date of the service. Headache education was done.Discussed treatment options including preventive and acute medications. Discussed medication side effects, adverse reactions and drug interactions. Written educational materials and patient instructions outlining all  of the above were given.  Ocie Doyne, MD 03/08/23 1:26 PM

## 2023-03-09 ENCOUNTER — Telehealth: Payer: Self-pay | Admitting: Psychiatry

## 2023-03-09 NOTE — Telephone Encounter (Signed)
Referral for Neurology fax to [facility]. Phone:  [phone], Fax:  [phone].

## 2023-03-14 ENCOUNTER — Ambulatory Visit: Payer: 59 | Admitting: Psychiatry

## 2023-03-14 ENCOUNTER — Ambulatory Visit: Payer: 59 | Attending: Obstetrics and Gynecology | Admitting: Physical Therapy

## 2023-03-14 ENCOUNTER — Encounter: Payer: Self-pay | Admitting: Physical Therapy

## 2023-03-14 DIAGNOSIS — R102 Pelvic and perineal pain: Secondary | ICD-10-CM

## 2023-03-14 DIAGNOSIS — M6281 Muscle weakness (generalized): Secondary | ICD-10-CM

## 2023-03-14 DIAGNOSIS — M542 Cervicalgia: Secondary | ICD-10-CM | POA: Diagnosis not present

## 2023-03-14 DIAGNOSIS — M62838 Other muscle spasm: Secondary | ICD-10-CM | POA: Diagnosis present

## 2023-03-14 DIAGNOSIS — M6289 Other specified disorders of muscle: Secondary | ICD-10-CM

## 2023-03-14 DIAGNOSIS — M5459 Other low back pain: Secondary | ICD-10-CM

## 2023-03-14 MED ORDER — BUPIVACAINE HCL (PF) 0.25 % IJ SOLN
8.0000 mL | Freq: Once | INTRAMUSCULAR | Status: AC
Start: 2023-03-14 — End: 2023-03-14
  Administered 2023-03-14: 8 mL

## 2023-03-14 MED ORDER — BETAMETHASONE SOD PHOS & ACET 6 (3-3) MG/ML IJ SUSP
6.0000 mg | Freq: Once | INTRAMUSCULAR | Status: AC
Start: 2023-03-14 — End: 2023-03-14
  Administered 2023-03-14: 6 mg via INTRAMUSCULAR

## 2023-03-14 NOTE — Progress Notes (Signed)
Procedure: Occipital Nerve injection/Trigger point injection  Location: bilateral occiput  The risks, benefits and anticipated outcomes of the procedure, the risks and benefits of the alternatives to the procedure, and the roles and tasks of the personnel to be involved, were discussed with the patient, and the patient consents to the procedure and agrees to proceed.     A combination of 1 cc betamethasone 6 mg and 4 cc of 0.25% bupivacaine were prepared in 2 syringes (5 cc).  2 trigger points on the splenius capitus were identified and injected. The left and right greater occipital nerves were injected 3cm caudal and 1.5 cm lateral to the inion where the main trunk of the occipital nerve penetrates the semispinalis muscle.  The needle was placed perpendicular and the needle advanced 1.5 cm. After aspiration to ensure no obstruction or presence of blood, the area was injected.  The needle was repositioned in a fan-like manner and the entire area was injected. Pressure was held and no hematoma was noted.   Desiree Doyne, MD 03/14/23 10:30 AM

## 2023-03-14 NOTE — Patient Instructions (Signed)
Creams to use externally on the Vulva area Vulva Balm/ V-magic cream by medicine mama- amazon Julva-amazon Vital "V Wild Yam salve ( help moisturize and help with thinning vulvar area, does have Beeswax MoodMaid Botanical Pro-Meno Wild Yam Cream- Amazon Desert Harvest Gele Cleo by Zane Herald labial moisturizer (Amazon),  Coconut or olive oil aloe Good Clean Love Enchanted Rose by intimate rose  Things to avoid in the vaginal area Do not use things to irritate the vulvar area No lotions just specialized creams for the vulva area- Neogyn, V-magic,  No soaps; can use Aveeno or Calendula cleanser, unscented Dove if needed. Must be gentle No deodorants No douches Good to sleep without underwear to let the vaginal area to air out No scrubbing: spread the lips to let warm water rinse over labias and pat dry    [facility] [address], [address] [place], [place] [address] Phone # [phone] Fax [phone]

## 2023-03-14 NOTE — Patient Instructions (Signed)
[facility] phone number: [phone]
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
## 2023-03-16 NOTE — Telephone Encounter (Signed)
[facility] [place]: [phone] exp. 03/08/23-05/06/23 for GI

## 2023-03-18 ENCOUNTER — Telehealth: Payer: Self-pay | Admitting: *Deleted

## 2023-03-18 NOTE — Telephone Encounter (Signed)
Patient has open order for PUS for pelvic pain and dysmenorrhea, ordered by Dr. Edward Jolly on 09/13/22. Last AEX 11/16/22.   Per review of EPIC, patient established care with [facility] 11/29/22.   PUS order cancelled.   Routing to Dr. Marjorie Smolder.   Encounter closed.

## 2023-03-21 ENCOUNTER — Ambulatory Visit: Payer: 59 | Admitting: Physical Therapy

## 2023-03-21 ENCOUNTER — Encounter: Payer: Self-pay | Admitting: Physical Therapy

## 2023-03-21 DIAGNOSIS — R102 Pelvic and perineal pain: Secondary | ICD-10-CM

## 2023-03-21 DIAGNOSIS — M6289 Other specified disorders of muscle: Secondary | ICD-10-CM

## 2023-03-21 DIAGNOSIS — M5459 Other low back pain: Secondary | ICD-10-CM

## 2023-03-21 DIAGNOSIS — M62838 Other muscle spasm: Secondary | ICD-10-CM
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
## 2023-03-29 ENCOUNTER — Ambulatory Visit: Payer: 59 | Admitting: Obstetrics and Gynecology

## 2023-03-30 ENCOUNTER — Ambulatory Visit: Payer: 59 | Admitting: Physical Therapy

## 2023-03-30 ENCOUNTER — Encounter: Payer: Self-pay | Admitting: Physical Therapy

## 2023-03-31 ENCOUNTER — Encounter: Payer: Self-pay | Admitting: Internal Medicine

## 2023-03-31 NOTE — Progress Notes (Signed)
Subjective:    Patient ID: Desiree Carlson, female    DOB: Jan 26, 1996, 27 y.o.   MRN: [id number]      HPI Creedence is here for  Chief Complaint  Patient presents with   Fatigue    Extreme fatigue, referral for GI, Discuss migraines and GI issues a/w hip pain radiating into legs more like bone pain     Extreme fatigue - bone pain, can sleep 12 hrs-15 hrs -- prefers to sleep to help get relieve from pain.  Pain comes in waves and it is intense - coming more frequent.    Her body temp feels hot a lot.  She has not had any documented fever.  body pain.  Concentrated in hips, lower back and down legs.  She has done PT for that and that has helped.  She had an MRI a few months ago-has a congenital narrowing of the spinal cord.   Following with neurology for migraines- has tried botox, nerve block - has not helped.  Saw new neuro - ordered MRI/A  ENT - swollen LN in neck - to have Ct scan and determine if bx is needed.    Referral to GI - having severe abdominal pain - especially since 2019- ? GI vs endometriosis.  Severe constipation and turning into diarrhea.  Stools are skinny, dark with mucus and blood at times.  When she is on her menses she has rectal bleeding.  She also has pain shooting up her spine.  Abdominal pain is diffuse - more concentrated around umbilicus.  S/p endometrial surgery.   Constipation - takes 1 senna daily and 1 cup metamucil.  Will remain constipated at times and other times will have diarrhea.    Medications and allergies reviewed with patient and updated if appropriate.  Current Outpatient Medications on File Prior to Visit  Medication Sig Dispense Refill   albuterol (VENTOLIN HFA) 108 (90 Base) MCG/ACT inhaler Inhale 1-2 puffs into the lungs every 6 (six) hours as needed for wheezing or shortness of breath. 6.7 g 5   ALPRAZolam (XANAX) 0.5 MG tablet TAKE 1 TABLET BY MOUTH TWICE DAILY AS NEEDED FOR ANXIETY FOR 30 DAYS      amphetamine-dextroamphetamine (ADDERALL XR) 20 MG 24 hr capsule Take 1 capsule by mouth every morning.     botulinum toxin Type A (BOTOX) 200 units injection Inject 200 Units into the muscle every 3 (three) months. 1 each 11   budesonide-formoterol (SYMBICORT) 160-4.5 MCG/ACT inhaler Inhale 2 puffs into the lungs 2 (two) times daily as needed. 1 each 1   cetirizine (ZYRTEC) 10 MG tablet Take by mouth.     clobetasol ointment (TEMOVATE) 0.05 % Apply 1 Application topically 2 (two) times daily. Place in a thin layer of the affected areas twice a day for 2 weeks.  Then place on the affected areas twice a week at bedtime. 60 g 0   dasatinib (SPRYCEL) 70 MG tablet 1 tablet - PER [facility] onc Orally Once a day     diazepam (VALIUM) 5 MG tablet Place 1 tablet vaginally nightly as needed for muscle spasm/ pelvic pain. 30 tablet 1   diclofenac Sodium (VOLTAREN) 1 % GEL as needed.     fluticasone (FLONASE) 50 MCG/ACT nasal spray 1 spray into each nostril daily.     ibuprofen (ADVIL) 200 MG tablet Take by mouth as needed.     lidocaine (XYLOCAINE) 2 % solution      loperamide (IMODIUM) 2 MG capsule Take  1 capsule (2 mg) total by mouth once daily as needed for diarrhea.     loratadine (CLARITIN) 10 MG tablet Take by mouth.     montelukast (SINGULAIR) 10 MG tablet Take 1 tablet (10 mg total) by mouth daily. Annual appt due in Sept must see provider for future refills 90 tablet 0   norethindrone (ORTHO MICRONOR) 0.35 MG tablet Take 1 tablet (0.35 mg total) by mouth daily. 28 tablet 11   nystatin (MYCOSTATIN/NYSTOP) powder Apply 1 Application topically 3 (three) times daily. Apply to affected area for up to 7 days 15 g 2   nystatin ointment (MYCOSTATIN) Apply 1 Application topically 2 (two) times daily. Apply to affected area for up to 7 days. 30 g 1   ondansetron (ZOFRAN) 4 MG tablet Take 1 tablet by mouth up to two times a day as needed for nausea. Second choice.     prochlorperazine (COMPAZINE) 10 MG tablet Take by  mouth.     SUMAtriptan (IMITREX) 100 MG tablet Take 1 tablet (100 mg total) by mouth every 2 (two) hours as needed for migraine. May repeat in 2 hours if headache persists or recurs. 10 tablet 6   topiramate (TOPAMAX) 25 MG tablet Take 25 mg (1 pill) at bedtime for one week, then increase to 50 mg (2 pills) at bedtime for one week, then take 75 mg (3 pills) at bedtime for one week, then take 100 mg (4 pills) at bedtime 120 tablet 6   No current facility-administered medications on file prior to visit.    Review of Systems  Constitutional:  Positive for fatigue. Negative for fever.       Feels hot  Respiratory:  Negative for shortness of breath.   Cardiovascular:  Positive for chest pain (not as much recently). Negative for palpitations and leg swelling.       Feels swollen throughout body - no edema  Gastrointestinal:  Positive for abdominal pain, constipation and diarrhea.  Musculoskeletal:  Positive for arthralgias and back pain.  Neurological:  Positive for light-headedness and headaches.       Objective:   Vitals:   04/01/23 0815  BP: 118/70  Pulse: 90  Temp: 97.6 F (36.4 C)  SpO2: 95%   BP Readings from Last 3 Encounters:  04/01/23 118/70  03/08/23 109/68  01/25/23 106/68   Wt Readings from Last 3 Encounters:  04/01/23 247 lb (112 kg)  03/08/23 245 lb 3.2 oz (111.2 kg)  11/19/22 252 lb 6 oz (114.5 kg)   Body mass index is 38.69 kg/m.    Physical Exam Constitutional:      General: She is not in acute distress.    Appearance: Normal appearance.  HENT:     Head: Normocephalic and atraumatic.  Eyes:     Conjunctiva/sclera: Conjunctivae normal.  Cardiovascular:     Rate and Rhythm: Normal rate and regular rhythm.     Heart sounds: Murmur (2/6 murmur) heard.  Pulmonary:     Effort: Pulmonary effort is normal. No respiratory distress.     Breath sounds: Normal breath sounds. No wheezing.  Musculoskeletal:     Cervical back: Neck supple.     Right lower leg:  No edema.     Left lower leg: No edema.  Lymphadenopathy:     Cervical: No cervical adenopathy.  Skin:    General: Skin is warm and dry.     Findings: No rash.  Neurological:     Mental Status: She is alert. Mental status is  at baseline.  Psychiatric:        Mood and Affect: Mood normal.        Behavior: Behavior normal.         Reviewed last MRI of lumbar spine.  Reviewed last CT scan of abdomen/pelvis.  Reviewed neurology visit.   Assessment & Plan:    See Problem List for Assessment and Plan of chronic medical problems.   I spent 40 minutes dedicated to the care of this patient on the date of this encounter including review of recent labs, imaging, speciality notes, obtaining history, communicating with the patient and her mother, ordering referrals, and documenting clinical information in the EHR

## 2023-04-01 ENCOUNTER — Ambulatory Visit: Payer: 59 | Admitting: Internal Medicine

## 2023-04-01 ENCOUNTER — Encounter: Payer: Self-pay | Admitting: Internal Medicine

## 2023-04-01 VITALS — BP 118/70 | HR 90 | Temp 97.6°F | Ht 67.0 in | Wt 247.0 lb

## 2023-04-01 DIAGNOSIS — K5909 Other constipation: Secondary | ICD-10-CM

## 2023-04-01 DIAGNOSIS — R011 Cardiac murmur, unspecified: Secondary | ICD-10-CM | POA: Insufficient documentation

## 2023-04-01 DIAGNOSIS — J4520 Mild intermittent asthma, uncomplicated: Secondary | ICD-10-CM

## 2023-04-01 DIAGNOSIS — R5382 Chronic fatigue, unspecified: Secondary | ICD-10-CM

## 2023-04-01 DIAGNOSIS — R1084 Generalized abdominal pain: Secondary | ICD-10-CM | POA: Diagnosis not present

## 2023-04-01 NOTE — Patient Instructions (Addendum)
    An Echo was ordered    A referral was ordered [facility] in [facility] and someone will call you to schedule an appointment.     Consider seeing the POTs specialist in [place] -  Call us today at [phone] or click the button below to schedule a complimentary 15-minute phone consultation with our lead POTS specialist, Dr. Theador Hawthorne, to see how our services may be right for you or a loved one.

## 2023-04-01 NOTE — Assessment & Plan Note (Signed)
Chronic She feels it has been worse. Likely multifactorial She has had a lot of blood work done, reviewed her blood work from care everywhere.  No obvious causes She has several chronic medical problems that are contributing ?  POTS-discussed seeing a specialist in [place] to help determine that May need to consider sleep apnea evaluation

## 2023-04-01 NOTE — Assessment & Plan Note (Signed)
Chronic Controlled Recent exacerbation resolved Mild, intermittent Continue albuterol inhaler as needed, continue Symbicort 2 puffs twice daily as needed-does not need this on a daily basis

## 2023-04-01 NOTE — Assessment & Plan Note (Signed)
Chronic Has had imaging in the past Has known endometriosis and did have surgery, but is still having pain She has constipation with secondary diarrhea times ?  GI versus endometriosis versus more likely combination of both Will look into the endometriosis clinic at [facility] she is not able to get into there can consider referral to [facility] or [facility] if they have 1 Referral to GI-in addition to the constipation and abdominal pain she states intermittent blood and mucus in the stool

## 2023-04-01 NOTE — Assessment & Plan Note (Signed)
2/6 systolic murmur Will order echocardiogram

## 2023-04-01 NOTE — Assessment & Plan Note (Signed)
Chronic Currently taking 1 senna and 1 cup Metamucil daily-this helps, but she still has constipation and occasionally diarrhea Advised that she needs to try different things to see what will best help her constipation which is likely the primary problem Will try probiotic Can try increasing stool softener Has had some blood and mucus at times, but I did not go into detail about this Referral for GI

## 2023-04-06 ENCOUNTER — Encounter: Payer: Self-pay | Admitting: Physical Therapy

## 2023-04-06 ENCOUNTER — Ambulatory Visit: Payer: 59 | Attending: Obstetrics and Gynecology | Admitting: Physical Therapy

## 2023-04-06 DIAGNOSIS — M5459 Other low back pain: Secondary | ICD-10-CM

## 2023-04-06 DIAGNOSIS — R102 Pelvic and perineal pain: Secondary | ICD-10-CM

## 2023-04-06 DIAGNOSIS — M62838 Other muscle spasm: Secondary | ICD-10-CM | POA: Diagnosis present
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
## 2023-04-11 ENCOUNTER — Ambulatory Visit (INDEPENDENT_AMBULATORY_CARE_PROVIDER_SITE_OTHER): Payer: 59 | Admitting: Obstetrics and Gynecology

## 2023-04-11 ENCOUNTER — Encounter: Payer: Self-pay | Admitting: Obstetrics and Gynecology

## 2023-04-11 VITALS — BP 104/64 | HR 74

## 2023-04-11 DIAGNOSIS — R350 Frequency of micturition: Secondary | ICD-10-CM

## 2023-04-11 DIAGNOSIS — R3989 Other symptoms and signs involving the genitourinary system: Secondary | ICD-10-CM

## 2023-04-11 DIAGNOSIS — M62838 Other muscle spasm: Secondary | ICD-10-CM

## 2023-04-11 MED ORDER — PHENAZOPYRIDINE HCL 95 MG PO TABS
95.0000 mg | ORAL_TABLET | Freq: Three times a day (TID) | ORAL | 1 refills | Status: DC | PRN
Start: 2023-04-11 — End: 2024-01-02

## 2023-04-11 MED ORDER — DIAZEPAM 5 MG PO TABS: ORAL_TABLET | ORAL | 1 refills | Status: AC

## 2023-04-11 MED ORDER — HYDROXYZINE HCL 50 MG PO TABS
50.0000 mg | ORAL_TABLET | Freq: Every evening | ORAL | 5 refills | Status: DC
Start: 2023-04-11 — End: 2023-05-06
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
## 2023-04-19 ENCOUNTER — Other Ambulatory Visit: Payer: 59

## 2023-04-20 ENCOUNTER — Ambulatory Visit: Payer: 59 | Admitting: Adult Health

## 2023-05-03 ENCOUNTER — Ambulatory Visit (HOSPITAL_COMMUNITY): Payer: 59 | Attending: Internal Medicine

## 2023-05-03 DIAGNOSIS — R011 Cardiac murmur, unspecified: Secondary | ICD-10-CM | POA: Diagnosis present

## 2023-05-03 LAB — ECHOCARDIOGRAM COMPLETE
Area-P 1/2: 3.42 cm2
Calc EF: 56.2 %
MV VTI: 2.64 cm2
S' Lateral: 3.4 cm
Single Plane A2C EF: 60.6 %
Single Plane A4C EF: 53.9 %

## 2023-05-03 NOTE — Progress Notes (Signed)
Patient ID: SABREENA VOGAN, female   DOB: 1995-11-13, 27 y.o.   MRN: [id number]  2D Echocardiogram has been performed.   Milda Smart, RDCS

## 2023-05-05 ENCOUNTER — Other Ambulatory Visit: Payer: Self-pay | Admitting: Obstetrics and Gynecology

## 2023-05-05 DIAGNOSIS — R3989 Other symptoms and signs involving the genitourinary system: Secondary | ICD-10-CM

## 2023-05-12 ENCOUNTER — Other Ambulatory Visit: Payer: Self-pay

## 2023-05-12 ENCOUNTER — Telehealth: Payer: Self-pay | Admitting: Internal Medicine

## 2023-05-12 MED ORDER — ALBUTEROL SULFATE HFA 108 (90 BASE) MCG/ACT IN AERS: 1.0000 | INHALATION_SPRAY | Freq: Four times a day (QID) | RESPIRATORY_TRACT | 5 refills | Status: DC | PRN

## 2023-05-12 NOTE — Telephone Encounter (Signed)
Sent in today 

## 2023-05-12 NOTE — Telephone Encounter (Signed)
Pt needs a refill on this her inhaler expired.    albuterol (VENTOLIN HFA) 108 (90 Base) MCG/ACT inhaler

## 2023-05-13 ENCOUNTER — Ambulatory Visit: Payer: 59 | Admitting: Nurse Practitioner

## 2023-05-13 ENCOUNTER — Other Ambulatory Visit: Payer: Self-pay

## 2023-05-13 ENCOUNTER — Encounter (HOSPITAL_COMMUNITY): Payer: Self-pay | Admitting: *Deleted

## 2023-05-13 ENCOUNTER — Emergency Department (HOSPITAL_COMMUNITY)
Admission: EM | Admit: 2023-05-13 | Discharge: 2023-05-14 | Disposition: A | Discharge status: Home / Self Care | Payer: 59 | Attending: Emergency Medicine | Admitting: Emergency Medicine

## 2023-05-13 ENCOUNTER — Ambulatory Visit: Payer: 59

## 2023-05-13 VITALS — HR 96 | Temp 98.3°F | Ht 67.0 in | Wt 239.4 lb

## 2023-05-13 DIAGNOSIS — J210 Acute bronchiolitis due to respiratory syncytial virus: Secondary | ICD-10-CM | POA: Diagnosis not present

## 2023-05-13 DIAGNOSIS — J45909 Unspecified asthma, uncomplicated: Secondary | ICD-10-CM | POA: Insufficient documentation

## 2023-05-13 DIAGNOSIS — Z20822 Contact with and (suspected) exposure to covid-19: Secondary | ICD-10-CM | POA: Diagnosis not present

## 2023-05-13 DIAGNOSIS — Z856 Personal history of leukemia: Secondary | ICD-10-CM | POA: Diagnosis not present

## 2023-05-13 DIAGNOSIS — Z7951 Long term (current) use of inhaled steroids: Secondary | ICD-10-CM | POA: Diagnosis not present

## 2023-05-13 DIAGNOSIS — J189 Pneumonia, unspecified organism: Secondary | ICD-10-CM

## 2023-05-13 DIAGNOSIS — J168 Pneumonia due to other specified infectious organisms: Secondary | ICD-10-CM | POA: Diagnosis not present

## 2023-05-13 DIAGNOSIS — R059 Cough, unspecified: Secondary | ICD-10-CM

## 2023-05-13 DIAGNOSIS — R079 Chest pain, unspecified: Secondary | ICD-10-CM | POA: Diagnosis present

## 2023-05-13 LAB — COMPREHENSIVE METABOLIC PANEL
ALT: 20 U/L (ref 0–35)
AST: 14 U/L (ref 0–37)
Albumin: 4.4 g/dL (ref 3.5–5.2)
Alkaline Phosphatase: 89 U/L (ref 39–117)
BUN: 10 mg/dL (ref 6–23)
CO2: 28 meq/L (ref 19–32)
Calcium: 9.3 mg/dL (ref 8.4–10.5)
Chloride: 100 meq/L (ref 96–112)
Creatinine, Ser: 0.78 mg/dL (ref 0.40–1.20)
GFR: 103.98 mL/min (ref >60.00)
Glucose, Bld: 78 mg/dL (ref 70–99)
Potassium: 3.8 meq/L (ref 3.5–5.1)
Sodium: 135 meq/L (ref 135–145)
Total Bilirubin: 0.3 mg/dL (ref 0.2–1.2)
Total Protein: 7.2 g/dL (ref 6.0–8.3)

## 2023-05-13 LAB — BASIC METABOLIC PANEL
Anion gap: 14 (ref 5–15)
BUN: 7 mg/dL (ref 6–20)
CO2: 22 mmol/L (ref 22–32)
Calcium: 9.1 mg/dL (ref 8.9–10.3)
Chloride: 100 mmol/L (ref 98–111)
Creatinine, Ser: 0.83 mg/dL (ref 0.44–1.00)
GFR, Estimated: >60 mL/min (ref >60)
Glucose, Bld: 94 mg/dL (ref 70–99)
Potassium: 3.7 mmol/L (ref 3.5–5.1)
Sodium: 136 mmol/L (ref 135–145)

## 2023-05-13 LAB — CBC
HCT: 39 % (ref 36.0–46.0)
HCT: 38.2 % (ref 36.0–46.0)
Hemoglobin: 12.7 g/dL (ref 12.0–15.0)
Hemoglobin: 12.6 g/dL (ref 12.0–15.0)
MCH: 28.1 pg (ref 26.0–34.0)
MCHC: 32.6 g/dL (ref 30.0–36.0)
MCHC: 33 g/dL (ref 30.0–36.0)
MCV: 86 fL (ref 78.0–100.0)
MCV: 85.3 fL (ref 80.0–100.0)
Platelets: 288 10*3/uL (ref 150.0–400.0)
Platelets: 278 10*3/uL (ref 150–400)
RBC: 4.53 Mil/uL (ref 3.87–5.11)
RBC: 4.48 MIL/uL (ref 3.87–5.11)
RDW: 14.5 % (ref 11.5–15.5)
RDW: 13.6 % (ref 11.5–15.5)
WBC: 10 10*3/uL (ref 4.0–10.5)
WBC: 9.5 10*3/uL (ref 4.0–10.5)
nRBC: 0 % (ref 0.0–0.2)

## 2023-05-13 LAB — TROPONIN I (HIGH SENSITIVITY)
Troponin I (High Sensitivity): 6 ng/L (ref <18)
Troponin I (High Sensitivity): 5 ng/L (ref <18)

## 2023-05-13 LAB — POCT INFLUENZA A/B
Influenza A, POC: NEGATIVE
Influenza B, POC: NEGATIVE

## 2023-05-13 LAB — HCG, SERUM, QUALITATIVE: Preg, Serum: NEGATIVE

## 2023-05-13 LAB — POCT RAPID STREP A (OFFICE): Rapid Strep A Screen: NEGATIVE

## 2023-05-13 LAB — POCT RESPIRATORY SYNCYTIAL VIRUS: RSV Rapid Ag: NEGATIVE

## 2023-05-13 LAB — POC COVID19 BINAXNOW: SARS Coronavirus 2 Ag: NEGATIVE

## 2023-05-13 MED ORDER — PREDNISONE 20 MG PO TABS
40.0000 mg | ORAL_TABLET | Freq: Every day | ORAL | 0 refills | Status: DC
Start: 2023-05-13 — End: 2023-05-17

## 2023-05-13 MED ORDER — BENZONATATE 100 MG PO CAPS
100.0000 mg | ORAL_CAPSULE | Freq: Two times a day (BID) | ORAL | 0 refills | Status: DC | PRN
Start: 2023-05-13 — End: 2023-09-16

## 2023-05-13 MED ORDER — AMOXICILLIN-POT CLAVULANATE 875-125 MG PO TABS
1.0000 | ORAL_TABLET | Freq: Two times a day (BID) | ORAL | 0 refills | Status: DC
Start: 2023-05-13 — End: 2023-09-16

## 2023-05-13 NOTE — ED Notes (Signed)
Pt had a near syncopal episode in the lobby and then again back in triage. Charge nurse informed and will keep her in triage at this time. EDP notified as well.

## 2023-05-13 NOTE — ED Triage Notes (Signed)
The pt was seen by her doctor and she was supposed to have a c-t scan today but she got there too late   her doctor wanted to r/o a pe

## 2023-05-13 NOTE — ED Triage Notes (Signed)
The pt has been having chest pain rt sided that radiates it backward on the rt.  Some sob and yesterday she fainted  she has floaters in her eyes etc  lmp sep17th

## 2023-05-13 NOTE — Assessment & Plan Note (Addendum)
Acute Patient is immunosuppressed due to chronic conditions Will check chest x-ray, CBC, CMP, CTA due to pain with deep breathing as she does have underlying malignancy and may be at higher risk for developing PE.  Vital signs stable today with oxygen saturation at 99% on room air.  Point-of-care RSV, COVID, flu, strep:negative  Will treat symptoms with Tessalon Perles as needed for cough suppression, has albuterol inhaler for as needed wheezing, prednisone 40 mg daily with breakfast x 5 days (discussed potential side effects need to avoid NSAIDs while taking prednisone, and what to do if side effects were to occur), and will prescribe Augmentin the patient can take if symptoms persist past the weekend. Patient encouraged to come back to office if symptoms persist.

## 2023-05-13 NOTE — Progress Notes (Signed)
Established Patient Office Visit  Subjective   Patient ID: Desiree Carlson, female    DOB: 1996/01/15  Age: 27 y.o. MRN: [id number]  Chief Complaint  Patient presents with   Cough    Chest congestion, cough that come with bad chest pain in the right area, body aches, been going for about a week. Started on Friday and has progress worse. Passed out last night, also weak legs, hip pain     Patient is 27 year old female with past medical history significant for complicated migraine, asthma, GERD, vasovagal syncope, CML, anxiety, ADD, depression, fibromyalgia, PTSD.  She arrives today for 2-day history of fever, chills, fatigue, cough with pain in chest during cough, wheezing, hip pain, myalgia, and near syncopal episode yesterday.  She reports that her last menstrual period was 04/20/2023 and she denies having had any sexual activity since last period.    Review of Systems  Constitutional:  Positive for chills, fever and malaise/fatigue.  Respiratory:  Positive for cough.   Cardiovascular:  Positive for chest pain.  Musculoskeletal:  Positive for joint pain and myalgias.      Objective:     Pulse 96   Temp 98.3 F (36.8 C) (Temporal)   Ht 5\' 7"  (1.702 m)   Wt 239 lb 6 oz (108.6 kg)   SpO2 99%   BMI 37.49 kg/m    Physical Exam Vitals reviewed.  Constitutional:      General: She is not in acute distress.    Appearance: Normal appearance.  HENT:     Head: Normocephalic and atraumatic.  Neck:     Vascular: No carotid bruit.  Cardiovascular:     Rate and Rhythm: Normal rate and regular rhythm.     Pulses: Normal pulses.     Heart sounds: Normal heart sounds.  Pulmonary:     Effort: Pulmonary effort is normal.     Breath sounds: Normal breath sounds.  Skin:    General: Skin is warm and dry.  Neurological:     General: No focal deficit present.     Mental Status: She is alert and oriented to person, place, and time.  Psychiatric:        Mood and Affect: Mood normal.         Behavior: Behavior normal.        Judgment: Judgment normal.      Results for orders placed or performed in visit on 05/13/23  POC COVID-19 BinaxNow  Result Value Ref Range   SARS Coronavirus 2 Ag Negative Negative  POCT rapid strep A  Result Value Ref Range   Rapid Strep A Screen Negative Negative  POCT Influenza A/B  Result Value Ref Range   Influenza A, POC Negative Negative   Influenza B, POC Negative Negative  POCT respiratory syncytial virus  Result Value Ref Range   RSV Rapid Ag negative       The ASCVD Risk score (Arnett DK, et al., 2019) failed to calculate for the following reasons:   The 2019 ASCVD risk score is only valid for ages 27 to 69    Assessment & Plan:   Problem List Items Addressed This Visit       Other   Cough - Primary    Acute Patient is immunosuppressed due to chronic conditions Will check chest x-ray, CBC, CMP, CTA due to pain with deep breathing as she does have underlying malignancy and may be at higher risk for developing PE.  Vital signs stable today with  oxygen saturation at 99% on room air.  Point-of-care RSV, COVID, flu, strep:negative  Will treat symptoms with Tessalon Perles as needed for cough suppression, has albuterol inhaler for as needed wheezing, prednisone 40 mg daily with breakfast x 5 days (discussed potential side effects need to avoid NSAIDs while taking prednisone, and what to do if side effects were to occur), and will prescribe Augmentin the patient can take if symptoms persist past the weekend. Patient encouraged to come back to office if symptoms persist.      Relevant Medications   benzonatate (TESSALON) 100 MG capsule   predniSONE (DELTASONE) 20 MG tablet   amoxicillin-clavulanate (AUGMENTIN) 875-125 MG tablet   Other Relevant Orders   DG Chest 2 View   CT Angio Chest W/Cm &/Or Wo Cm   CBC   Comprehensive metabolic panel   POC COVID-19 BinaxNow (Completed)   POCT rapid strep A (Completed)   POCT  Influenza A/B (Completed)   POCT respiratory syncytial virus (Completed)    Return if symptoms worsen or fail to improve.    Elenore Paddy, NP

## 2023-05-14 ENCOUNTER — Emergency Department (HOSPITAL_COMMUNITY): Payer: 59

## 2023-05-14 LAB — HEPATIC FUNCTION PANEL
ALT: 21 U/L (ref 0–44)
AST: 21 U/L (ref 15–41)
Albumin: 3.8 g/dL (ref 3.5–5.0)
Alkaline Phosphatase: 80 U/L (ref 38–126)
Bilirubin, Direct: <0.1 mg/dL (ref 0.0–0.2)
Total Bilirubin: 0.2 mg/dL — ABNORMAL LOW (ref 0.3–1.2)
Total Protein: 7.2 g/dL (ref 6.5–8.1)

## 2023-05-14 LAB — LIPASE, BLOOD: Lipase: 27 U/L (ref 11–51)

## 2023-05-14 LAB — RESP PANEL BY RT-PCR (RSV, FLU A&B, COVID)  RVPGX2
Influenza A by PCR: NEGATIVE
Influenza B by PCR: NEGATIVE
Resp Syncytial Virus by PCR: POSITIVE — AB
SARS Coronavirus 2 by RT PCR: NEGATIVE

## 2023-05-14 LAB — I-STAT CG4 LACTIC ACID, ED: Lactic Acid, Venous: 1 mmol/L (ref 0.5–1.9)

## 2023-05-14 MED ORDER — MORPHINE SULFATE (PF) 4 MG/ML IV SOLN
4.0000 mg | Freq: Once | INTRAVENOUS | Status: AC
  Administered 2023-05-14: 4 mg via INTRAVENOUS
  Filled 2023-05-14: qty 1

## 2023-05-14 MED ORDER — ONDANSETRON HCL 4 MG/2ML IJ SOLN
4.0000 mg | Freq: Once | INTRAMUSCULAR | Status: AC
  Administered 2023-05-14: 4 mg via INTRAVENOUS
  Filled 2023-05-14: qty 2

## 2023-05-14 MED ORDER — IOHEXOL 350 MG/ML SOLN
75.0000 mL | Freq: Once | INTRAVENOUS | Status: AC | PRN
  Administered 2023-05-14: 75 mL via INTRAVENOUS

## 2023-05-14 MED ORDER — ACETAMINOPHEN 325 MG PO TABS
650.0000 mg | ORAL_TABLET | Freq: Once | ORAL | Status: AC
  Administered 2023-05-14: 650 mg via ORAL
  Filled 2023-05-14: qty 2

## 2023-05-14 NOTE — ED Provider Notes (Signed)
[place] EMERGENCY DEPARTMENT AT [facility] Provider Note   CSN: [id number] Arrival date & time: 05/13/23  1939     History  Chief Complaint  Patient presents with   Chest Pain    Desiree Carlson is a 27 y.o. female.  27 year old female here with parents for chest pain, SHOB, body aches, nausea, chills (max temp 99.8). History of CML, on daily oral chemo, asthma, migraines, endometriosis. Reports onset of CP Thursday, all across chest and worse with coughing. Now with pain under right breast, radiates into back. Had syncopal/near syncopal episode on Thursday after going up flight of stairs, passed out/fell to the ground, could still hear her dog. Went to UC today and was told to go to ER for worsening or concerning symptoms, unable to obtain timely OP PE study.        Home Medications Prior to Admission medications   Medication Sig Start Date End Date Taking? Authorizing Provider  albuterol (VENTOLIN HFA) 108 (90 Base) MCG/ACT inhaler Inhale 1-2 puffs into the lungs every 6 (six) hours as needed for wheezing or shortness of breath. 05/12/23   Burns, Bobette Mo, MD  ALPRAZolam (XANAX) 0.5 MG tablet TAKE 1 TABLET BY MOUTH TWICE DAILY AS NEEDED FOR ANXIETY FOR 30 DAYS 03/16/21   [provider]  amitriptyline (ELAVIL) 50 MG tablet Take 50 mg by mouth at bedtime.    [provider]  amoxicillin-clavulanate (AUGMENTIN) 875-125 MG tablet Take 1 tablet by mouth 2 (two) times daily. 05/13/23   Elenore Paddy, NP  amphetamine-dextroamphetamine (ADDERALL XR) 20 MG 24 hr capsule Take 1 capsule by mouth every morning. 08/20/22   [provider]  benzonatate (TESSALON) 100 MG capsule Take 1 capsule (100 mg total) by mouth 2 (two) times daily as needed for cough. 05/13/23   Elenore Paddy, NP  botulinum toxin Type A (BOTOX) 200 units injection Inject 200 Units into the muscle every 3 (three) months. 04/08/22   Ocie Doyne, MD  budesonide-formoterol (SYMBICORT)  160-4.5 MCG/ACT inhaler Inhale 2 puffs into the lungs 2 (two) times daily as needed. 07/19/22   Pincus Sanes, MD  clobetasol ointment (TEMOVATE) 0.05 % Apply 1 Application topically 2 (two) times daily. Place in a thin layer of the affected areas twice a day for 2 weeks.  Then place on the affected areas twice a week at bedtime. 11/16/22   Patton Salles, MD  dasatinib (SPRYCEL) 70 MG tablet 1 tablet - PER [facility] Orally Once a day 11/25/21   [provider]  diazepam (VALIUM) 5 MG tablet Place 1 tablet vaginally nightly as needed for muscle spasm/ pelvic pain. 04/11/23   Selmer Dominion, NP  diclofenac Sodium (VOLTAREN) 1 % GEL as needed. 05/11/21   [provider]  fluticasone (FLONASE) 50 MCG/ACT nasal spray 1 spray into each nostril daily. 04/23/21   [provider]  hydrOXYzine (ATARAX) 50 MG tablet TAKE 1 TABLET BY MOUTH EVERYDAY AT BEDTIME 05/06/23   Zuleta, Joan Mayans, NP  ibuprofen (ADVIL) 200 MG tablet Take by mouth as needed.    [provider]  lidocaine (XYLOCAINE) 2 % solution  04/26/21   [provider]  loperamide (IMODIUM) 2 MG capsule Take 1 capsule (2 mg) total by mouth once daily as needed for diarrhea. 04/23/21   [provider]  loratadine (CLARITIN) 10 MG tablet Take by mouth.    [provider]  montelukast (SINGULAIR) 10 MG tablet Take  1 tablet (10 mg total) by mouth daily. Annual appt due in Sept must see provider for future refills 01/17/23   Pincus Sanes, MD  norethindrone (ORTHO MICRONOR) 0.35 MG tablet Take 1 tablet (0.35 mg total) by mouth daily. 11/16/22   Patton Salles, MD  nystatin (MYCOSTATIN/NYSTOP) powder Apply 1 Application topically 3 (three) times daily. Apply to affected area for up to 7 days 11/16/22   Patton Salles, MD  nystatin ointment (MYCOSTATIN) Apply 1 Application topically 2 (two) times daily. Apply to affected area for up to 7 days. 11/16/22   Patton Salles, MD  ondansetron (ZOFRAN) 4 MG tablet Take 1 tablet by mouth up to two times a day as needed for nausea. Second choice. 05/11/21   [provider]  phenazopyridine (PYRIDIUM) 95 MG tablet Take 1 tablet (95 mg total) by mouth 3 (three) times daily as needed for pain. 04/11/23   Selmer Dominion, NP  predniSONE (DELTASONE) 20 MG tablet Take 2 tablets (40 mg total) by mouth daily with breakfast. 05/13/23   Elenore Paddy, NP  prochlorperazine (COMPAZINE) 10 MG tablet Take by mouth. 04/23/21   [provider]  SUMAtriptan (IMITREX) 100 MG tablet Take 1 tablet (100 mg total) by mouth every 2 (two) hours as needed for migraine. May repeat in 2 hours if headache persists or recurs. 03/08/23   Ocie Doyne, MD  topiramate (TOPAMAX) 25 MG tablet Take 25 mg (1 pill) at bedtime for one week, then increase to 50 mg (2 pills) at bedtime for one week, then take 75 mg (3 pills) at bedtime for one week, then take 100 mg (4 pills) at bedtime 07/21/22   Ihor Austin, NP      Allergies    Duloxetine, Bupropion, Cefdinir, and Vortioxetine    Review of Systems   Review of Systems Negative except as per HPI Physical Exam Updated Vital Signs BP 105/69   Pulse 78   Temp 98.3 F (36.8 C) (Oral)   Resp 18   Ht 5\' 7"  (1.702 m)   Wt 108.6 kg   LMP 04/20/2023   SpO2 100%   BMI 37.50 kg/m  Physical Exam Vitals and nursing note reviewed.  Constitutional:      General: She is not in acute distress.    Appearance: She is well-developed. She is not diaphoretic.  HENT:     Head: Normocephalic and atraumatic.  Cardiovascular:     Rate and Rhythm: Normal rate and regular rhythm.     Heart sounds: Normal heart sounds.  Pulmonary:     Effort: Pulmonary effort is normal.     Breath sounds: Normal breath sounds.  Abdominal:     Palpations: Abdomen is soft.     Tenderness: There is abdominal tenderness.     Comments: Mild generalized tenderness, worse in RUQ  Skin:    General: Skin is  warm and dry.     Findings: No erythema or rash.  Neurological:     Mental Status: She is alert and oriented to person, place, and time.  Psychiatric:        Behavior: Behavior normal.     ED Results / Procedures / Treatments   Labs (all labs ordered are listed, but only abnormal results are displayed) Labs Reviewed  RESP PANEL BY RT-PCR (RSV, FLU A&B, COVID)  RVPGX2 - Abnormal; Notable for the following components:      Result Value   Resp Syncytial  Virus by PCR POSITIVE (*)    All other components within normal limits  HEPATIC FUNCTION PANEL - Abnormal; Notable for the following components:   Total Bilirubin 0.2 (*)    All other components within normal limits  CULTURE, BLOOD (ROUTINE X 2)  CULTURE, BLOOD (ROUTINE X 2)  BASIC METABOLIC PANEL  CBC  HCG, SERUM, QUALITATIVE  LIPASE, BLOOD  I-STAT CG4 LACTIC ACID, ED  TROPONIN I (HIGH SENSITIVITY)  TROPONIN I (HIGH SENSITIVITY)    EKG EKG Interpretation Date/Time:  Friday May 13 2023 19:47:26 EDT Ventricular Rate:  113 PR Interval:  144 QRS Duration:  74 QT Interval:  316 QTC Calculation: 433 R Axis:   97  Text Interpretation: Sinus tachycardia Rightward axis Nonspecific T wave abnormality Abnormal ECG When compared with ECG of 02-Apr-2022 11:37, PREVIOUS ECG IS PRESENT No significant change was found Confirmed by Glynn Octave [id number]) on 05/14/2023 1:14:15 AM  Radiology CT Angio Chest PE W/Cm &/Or Wo Cm  Result Date: 05/14/2023 CLINICAL DATA:  Cough, right-sided chest pain, shortness of breath EXAM: CT ANGIOGRAPHY CHEST WITH CONTRAST TECHNIQUE: Multidetector CT imaging of the chest was performed using the standard protocol during bolus administration of intravenous contrast. Multiplanar CT image reconstructions and MIPs were obtained to evaluate the vascular anatomy. RADIATION DOSE REDUCTION: This exam was performed according to the departmental dose-optimization program which includes automated exposure control,  adjustment of the mA and/or kV according to patient size and/or use of iterative reconstruction technique. CONTRAST:  75mL OMNIPAQUE IOHEXOL 350 MG/ML SOLN COMPARISON:  Chest x-ray 05/13/2023 FINDINGS: Cardiovascular: No filling defects in the pulmonary arteries to suggest pulmonary emboli. Heart is normal size. Aorta is normal caliber. Mediastinum/Nodes: No mediastinal, hilar, or axillary adenopathy. Trachea and esophagus are unremarkable. Thyroid unremarkable. Lungs/Pleura: Nodular airspace disease in the left upper lobe concerning for pneumonia. Right lung clear. No effusions. Upper Abdomen: Low-density throughout the liver compatible with fatty infiltration. Musculoskeletal: Chest wall soft tissues are unremarkable. No acute bony abnormality. Review of the MIP images confirms the above findings. IMPRESSION: No evidence of pulmonary embolus. Nodular left upper lobe airspace disease most compatible with pneumonia. Hepatic steatosis Electronically Signed   By: Charlett Nose M.D.   On: 05/14/2023 03:24   DG Chest 2 View  Result Date: 05/13/2023 CLINICAL DATA:  Cough, right-sided chest pain.  Shortness of breath. EXAM: CHEST - 2 VIEW COMPARISON:  07/19/2022 FINDINGS: The cardiomediastinal contours are normal. Mild peribronchial thickening. Left perihilar atelectasis. No confluent airspace disease. Pulmonary vasculature is normal. No pleural effusion or pneumothorax. No acute osseous abnormalities are seen. IMPRESSION: Mild peribronchial thickening and left perihilar atelectasis. Electronically Signed   By: Narda Rutherford M.D.   On: 05/13/2023 17:34    Procedures Procedures    Medications Ordered in ED Medications  ondansetron (ZOFRAN) injection 4 mg (4 mg Intravenous Given 05/14/23 0105)  morphine (PF) 4 MG/ML injection 4 mg (4 mg Intravenous Given 05/14/23 0112)  iohexol (OMNIPAQUE) 350 MG/ML injection 75 mL (75 mLs Intravenous Contrast Given 05/14/23 0120)  acetaminophen (TYLENOL) tablet 650 mg  (650 mg Oral Given 05/14/23 0241)    ED Course/ Medical Decision Making/ A&P                                 Medical Decision Making Amount and/or Complexity of Data Reviewed Labs: ordered. Radiology: ordered.  Risk OTC drugs. Prescription drug management.   This patient presents to the ED for  concern of viral illness/syncope/CP/SHOB, this involves an extensive number of treatment options, and is a complaint that carries with it a high risk of complications and morbidity.  The differential diagnosis includes but not limited to PE, PNA, viral illness, acute cholecystitis, fatty liver   Co morbidities that complicate the patient evaluation  CML on oral chemo, asthma, RMSF, migraine   Additional history obtained:  Additional history obtained from parents at bedside who contribute to history as above External records from outside source obtained and reviewed including UC visit today, prior labs for comparison. Prior CT a/p 12/14/22 with normal gallbladder   Lab Tests:  I Ordered, and personally interpreted labs.  The pertinent results include:  lactic acid 1.0. Resp panel positive for RSV, negative for COVID and flu. Troponins unremarkable x 2. CMP without sig findings. CBC WNL.    Imaging Studies ordered:  I ordered imaging studies including CT PE study  I independently visualized and interpreted imaging which showed LUL PNA, negative for PE I agree with the radiologist interpretation   Cardiac Monitoring: / EKG:  The patient was maintained on a cardiac monitor.  I personally viewed and interpreted the cardiac monitored which showed an underlying rhythm of: sinus tach, rate 113   Problem List / ED Course / Critical interventions / Medication management  27 year old female with chest pain, SHOB, body aches and near syncope as above. Patient is well appearing on exam, non toxic, in no distress. Labs reassuring. Due to history of CML and syncope/tachycardia/risk for PE, a  CT was obtained which showed a LUL PNA. Also found to be RSV +. Patient already prescribed Augmentin and prednisone by UC earlier today, will plan to take these and follow up with PCP early next week with return to ER precautions. I ordered medication including morphine, Zofran, tylenol  for pain  Reevaluation of the patient after these medicines showed that the patient improved I have reviewed the patients home medicines and have made adjustments as needed   Social Determinants of Health:  Lives with family, has PCP   Test / Admission - Considered:  Stable for dc with plan for close follow up          Final Clinical Impression(s) / ED Diagnoses Final diagnoses:  RSV (acute bronchiolitis due to respiratory syncytial virus)  Pneumonia of left upper lobe due to infectious organism    Rx / DC Orders ED Discharge Orders     None         Jeannie Fend, PA-C 05/14/23 0349    Glynn Octave, MD 05/14/23 [phone]

## 2023-05-14 NOTE — Discharge Instructions (Signed)
Cool mist vaporizer to room at night. Saline spray to nose. Take medications as previously prescribed. Follow up with your PCP early next week. Return to the ER as needed for any worsening or concerning symptoms.

## 2023-05-16 ENCOUNTER — Encounter: Payer: Self-pay | Admitting: Internal Medicine

## 2023-05-16 DIAGNOSIS — J121 Respiratory syncytial virus pneumonia: Secondary | ICD-10-CM | POA: Insufficient documentation

## 2023-05-16 NOTE — Patient Instructions (Addendum)
     You had a steroid injection today.   Complete current medications. Use the albuterol every 6 hrs.  Hold prednisone today and take tomorrow and then start the taper.  When you are complete with the steroid use the Symbicort twice a day.        Return if symptoms worsen or fail to improve.

## 2023-05-16 NOTE — Progress Notes (Unsigned)
Subjective:    Patient ID: Desiree Carlson, female    DOB: [birth year], 27 y.o.   MRN: [id number]     HPI Desiree Carlson is here for follow up from the ED.  10/11 -was seen here in the office.  At that time COVID, flu and RSV rapid test were negative.  She was prescribed prednisone 40 mg daily x 5 days and Augmentin.  10/11 - went to the ED for chest pain, SOB, body aches, nausea, chills.  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  Stated 2 days of chest pain across chest that was worse with coughing.  Pain spread to under breast - radiated to back.  Had near syncope day prior after going up stairs - near LOC.    Blood work reviewed - neg.  EKG w/o change.  Viral panel positive for RSV.  CT angio chest - neg for PE.  LUL PNA seen. Hepatic steatosis.   She does not feel better.  She has been collapsing - her body is giving out.  Her hips have been hurting.  Still a lot of coughing, SOB.  Hurts to cough.    Mucus is discolored with specks of blood.  Still with chills, subjective fever.    Sleeping a lot.  Drinking a lot of fluids.  Eating ok  Completes prednisone today.  Using albuterol twice a day.  Not using symbicort.   Medications and allergies reviewed with patient and updated if appropriate.  Current Outpatient Medications on File Prior to Visit  Medication Sig Dispense Refill   albuterol (VENTOLIN HFA) 108 (90 Base) MCG/ACT inhaler Inhale 1-2 puffs into the lungs every 6 (six) hours as needed for wheezing or shortness of breath. 6.7 g 5   ALPRAZolam (XANAX) 0.5 MG tablet TAKE 1 TABLET BY MOUTH TWICE DAILY AS NEEDED FOR ANXIETY FOR 30 DAYS     amitriptyline (ELAVIL) 50 MG tablet Take 50 mg by mouth at bedtime.     amoxicillin-clavulanate (AUGMENTIN) 875-125 MG tablet Take 1 tablet by mouth 2 (two) times daily. 20 tablet 0   amphetamine-dextroamphetamine (ADDERALL XR) 20 MG 24 hr capsule Take 1 capsule by mouth every morning.     benzonatate (TESSALON) 100 MG capsule Take 1 capsule (100 mg total)  by mouth 2 (two) times daily as needed for cough. 20 capsule 0   botulinum toxin Type A (BOTOX) 200 units injection Inject 200 Units into the muscle every 3 (three) months. 1 each 11   budesonide-formoterol (SYMBICORT) 160-4.5 MCG/ACT inhaler Inhale 2 puffs into the lungs 2 (two) times daily as needed. 1 each 1   clobetasol ointment (TEMOVATE) 0.05 % Apply 1 Application topically 2 (two) times daily. Place in a thin layer of the affected areas twice a day for 2 weeks.  Then place on the affected areas twice a week at bedtime. 60 g 0   dasatinib (SPRYCEL) 70 MG tablet 1 tablet - PER [facility] Orally Once a day     diazepam (VALIUM) 5 MG tablet Place 1 tablet vaginally nightly as needed for muscle spasm/ pelvic pain. 30 tablet 1   diclofenac Sodium (VOLTAREN) 1 % GEL as needed.     fluticasone (FLONASE) 50 MCG/ACT nasal spray 1 spray into each nostril daily.     hydrOXYzine (ATARAX) 50 MG tablet TAKE 1 TABLET BY MOUTH EVERYDAY AT BEDTIME 90 tablet 2   ibuprofen (ADVIL) 200 MG tablet Take by mouth as needed.     lidocaine (XYLOCAINE) 2 % solution  loperamide (IMODIUM) 2 MG capsule Take 1 capsule (2 mg) total by mouth once daily as needed for diarrhea.     loratadine (CLARITIN) 10 MG tablet Take by mouth.     montelukast (SINGULAIR) 10 MG tablet Take 1 tablet (10 mg total) by mouth daily. Annual appt due in Sept must see provider for future refills 90 tablet 0   norethindrone (ORTHO MICRONOR) 0.35 MG tablet Take 1 tablet (0.35 mg total) by mouth daily. 28 tablet 11   norethindrone-ethinyl estradiol-iron (LOESTRIN FE) 1.5-30 MG-MCG tablet Take 1 tablet by mouth daily.     nystatin (MYCOSTATIN/NYSTOP) powder Apply 1 Application topically 3 (three) times daily. Apply to affected area for up to 7 days 15 g 2   nystatin ointment (MYCOSTATIN) Apply 1 Application topically 2 (two) times daily. Apply to affected area for up to 7 days. 30 g 1   ondansetron (ZOFRAN) 4 MG tablet Take 1 tablet by mouth up to two  times a day as needed for nausea. Second choice.     phenazopyridine (PYRIDIUM) 95 MG tablet Take 1 tablet (95 mg total) by mouth 3 (three) times daily as needed for pain. 30 tablet 1   predniSONE (DELTASONE) 20 MG tablet Take 2 tablets (40 mg total) by mouth daily with breakfast. 10 tablet 0   prochlorperazine (COMPAZINE) 10 MG tablet Take by mouth.     SUMAtriptan (IMITREX) 100 MG tablet Take 1 tablet (100 mg total) by mouth every 2 (two) hours as needed for migraine. May repeat in 2 hours if headache persists or recurs. 10 tablet 6   topiramate (TOPAMAX) 25 MG tablet Take 25 mg (1 pill) at bedtime for one week, then increase to 50 mg (2 pills) at bedtime for one week, then take 75 mg (3 pills) at bedtime for one week, then take 100 mg (4 pills) at bedtime 120 tablet 6   EMGALITY, 300 MG DOSE, 100 MG/ML SOSY Inject into the skin.     No current facility-administered medications on file prior to visit.     Review of Systems  Constitutional:  Positive for chills, fatigue and fever.  HENT:  Negative for ear pain (popping and itching).   Eyes:  Positive for visual disturbance (blurry vision when lightheaded and walking around).  Respiratory:  Positive for cough (productive), shortness of breath and wheezing (rattling in chest).   Cardiovascular:  Positive for chest pain (initially right anterior - now left anterior to back).  Musculoskeletal:  Positive for myalgias.  Neurological:  Positive for light-headedness and headaches.       Objective:   Vitals:   05/17/23 0748  BP: 106/64  Pulse: 74  Temp: 98.2 F (36.8 C)  SpO2: 100%   BP Readings from Last 3 Encounters:  05/17/23 106/64  05/14/23 105/69  04/11/23 104/64   Wt Readings from Last 3 Encounters:  05/17/23 248 lb (112.5 kg)  05/13/23 239 lb 6.7 oz (108.6 kg)  05/13/23 239 lb 6 oz (108.6 kg)   Body mass index is 38.84 kg/m.    Physical Exam Constitutional:      General: She is not in acute distress.    Appearance:  Normal appearance. She is not ill-appearing.  HENT:     Head: Normocephalic and atraumatic.     Right Ear: Tympanic membrane, ear canal and external ear normal.     Left Ear: Tympanic membrane, ear canal and external ear normal.     Mouth/Throat:     Mouth: Mucous membranes are  moist.     Pharynx: No oropharyngeal exudate or posterior oropharyngeal erythema.  Eyes:     Conjunctiva/sclera: Conjunctivae normal.  Cardiovascular:     Rate and Rhythm: Normal rate and regular rhythm.     Heart sounds: Normal heart sounds.  Pulmonary:     Effort: Pulmonary effort is normal. No respiratory distress.     Breath sounds: Wheezing (diffuse expiratory wheeze) present. No rales.  Musculoskeletal:     Cervical back: Neck supple. No tenderness.     Right lower leg: No edema.     Left lower leg: No edema.  Lymphadenopathy:     Cervical: No cervical adenopathy.  Skin:    General: Skin is warm and dry.     Findings: No rash.  Neurological:     Mental Status: She is alert. Mental status is at baseline.  Psychiatric:        Mood and Affect: Mood normal.        Behavior: Behavior normal.        Lab Results  Component Value Date   WBC 9.5 05/13/2023   HGB 12.6 05/13/2023   HCT 38.2 05/13/2023   PLT 278 05/13/2023   GLUCOSE 94 05/13/2023   CHOL 206 (H) 03/22/2018   TRIG 88 03/22/2018   HDL 60 03/22/2018   LDLCALC 128 (H) 03/22/2018   ALT 21 05/14/2023   AST 21 05/14/2023   NA 136 05/13/2023   K 3.7 05/13/2023   CL 100 05/13/2023   CREATININE 0.83 05/13/2023   BUN 7 05/13/2023   CO2 22 05/13/2023   TSH 2.58 05/28/2022   HGBA1C 5.0 03/23/2022   CT Angio Chest PE W/Cm &/Or Wo Cm CLINICAL DATA:  Cough, right-sided chest pain, shortness of breath  EXAM: CT ANGIOGRAPHY CHEST WITH CONTRAST  TECHNIQUE: Multidetector CT imaging of the chest was performed using the standard protocol during bolus administration of intravenous contrast. Multiplanar CT image reconstructions and MIPs  were obtained to evaluate the vascular anatomy.  RADIATION DOSE REDUCTION: This exam was performed according to the departmental dose-optimization program which includes automated exposure control, adjustment of the mA and/or kV according to patient size and/or use of iterative reconstruction technique.  CONTRAST:  75mL OMNIPAQUE IOHEXOL 350 MG/ML SOLN  COMPARISON:  Chest x-ray 05/13/2023  FINDINGS: Cardiovascular: No filling defects in the pulmonary arteries to suggest pulmonary emboli. Heart is normal size. Aorta is normal caliber.  Mediastinum/Nodes: No mediastinal, hilar, or axillary adenopathy. Trachea and esophagus are unremarkable. Thyroid unremarkable.  Lungs/Pleura: Nodular airspace disease in the left upper lobe concerning for pneumonia. Right lung clear. No effusions.  Upper Abdomen: Low-density throughout the liver compatible with fatty infiltration.  Musculoskeletal: Chest wall soft tissues are unremarkable. No acute bony abnormality.  Review of the MIP images confirms the above findings.  IMPRESSION: No evidence of pulmonary embolus.  Nodular left upper lobe airspace disease most compatible with pneumonia.  Hepatic steatosis  Electronically Signed   By: Charlett Nose M.D.   On: 05/14/2023 03:24    Assessment & Plan:    See Problem List for Assessment and Plan of chronic medical problems.

## 2023-05-16 NOTE — Telephone Encounter (Signed)
[employer] auth: [id number] exp. 05/16/23-06/30/23  Healthy Lelon Frohlich: [phone]       In Progress  Anticipated Determination Date: 05/24/2023

## 2023-05-17 ENCOUNTER — Telehealth: Payer: Self-pay | Admitting: Internal Medicine

## 2023-05-17 ENCOUNTER — Other Ambulatory Visit (HOSPITAL_COMMUNITY): Payer: Self-pay

## 2023-05-17 ENCOUNTER — Ambulatory Visit: Payer: 59 | Admitting: Internal Medicine

## 2023-05-17 ENCOUNTER — Ambulatory Visit (INDEPENDENT_AMBULATORY_CARE_PROVIDER_SITE_OTHER): Payer: 59 | Admitting: Internal Medicine

## 2023-05-17 VITALS — BP 106/64 | HR 74 | Temp 98.2°F | Wt 248.0 lb

## 2023-05-17 DIAGNOSIS — R300 Dysuria: Secondary | ICD-10-CM | POA: Diagnosis not present

## 2023-05-17 DIAGNOSIS — J121 Respiratory syncytial virus pneumonia: Secondary | ICD-10-CM | POA: Diagnosis not present

## 2023-05-17 DIAGNOSIS — R42 Dizziness and giddiness: Secondary | ICD-10-CM | POA: Insufficient documentation

## 2023-05-17 DIAGNOSIS — J4521 Mild intermittent asthma with (acute) exacerbation: Secondary | ICD-10-CM

## 2023-05-17 MED ORDER — BUDESONIDE-FORMOTEROL FUMARATE 160-4.5 MCG/ACT IN AERO: 2.0000 | INHALATION_SPRAY | Freq: Two times a day (BID) | RESPIRATORY_TRACT | 8 refills | Status: AC | PRN

## 2023-05-17 MED ORDER — PROMETHAZINE-DM 6.25-15 MG/5ML PO SYRP: 5.0000 mL | ORAL_SOLUTION | Freq: Four times a day (QID) | ORAL | 0 refills | Status: DC | PRN

## 2023-05-17 MED ORDER — METHYLPREDNISOLONE ACETATE 80 MG/ML IJ SUSP
80.0000 mg | Freq: Once | INTRAMUSCULAR | Status: AC
Start: 2023-05-17 — End: 2023-05-17
  Administered 2023-05-17: 80 mg via INTRAMUSCULAR

## 2023-05-17 MED ORDER — PREDNISONE 10 MG PO TABS: ORAL_TABLET | ORAL | 0 refills | Status: DC

## 2023-05-17 NOTE — Assessment & Plan Note (Addendum)
Acute Initially tested negative here, but did test positive in the ED CT scan with left upper lobe pneumonia On Augmentin 875-125 mg twice daily, prednisone 40 mg daily x 5 days-today will be her last day, Tessalon Perles, albuterol inhaler Symptoms still significant-and she is not sure if she is seen improvement or not Has diffuse wheezing on exam Oxygen level excellent on room air Depo-Medrol 80 mg IM x 1 Hold today's prednisone dose and will take tomorrow and after that longer prednisone taper-40 mg x 3 days-taper from there Once done with oral prednisone and start Symbicort twice daily Increase albuterol to every 6 hours as needed Promethazine-DM cough syrup sent to pharmacy Discussed and will take her a while to improve but all of her symptoms should slowly get better-she will call with any questions or concerns

## 2023-05-17 NOTE — Assessment & Plan Note (Signed)
Intermittent Possibly related to orthostatic hypotension Stressed good fluid intake-especially water Advised wearing compression socks daily to see if that helps

## 2023-05-17 NOTE — Assessment & Plan Note (Signed)
Acute Secondary to RSV and pneumonia Diffuse wheezing on exam Increase albuterol to every 6 hours Depo-Medrol 40 mg IM x 1 today Hold 40 mg prednisone dose today and take tomorrow and then prednisone taper 40 mg x 3 days, 30 mg x 3 days, 20 mg x 3 days and 10 mg x 3 days Once done with the steroids start Symbicort twice daily Call if no improvement

## 2023-05-17 NOTE — Telephone Encounter (Signed)
Pt called stating the pharmacy said they need prior authorization or another prescription for budesonide-formoterol (SYMBICORT) . Please update pt.

## 2023-05-19 LAB — CULTURE, BLOOD (ROUTINE X 2)
Culture: NO GROWTH
Culture: NO GROWTH
Special Requests: ADEQUATE
Special Requests: ADEQUATE

## 2023-05-25 NOTE — Telephone Encounter (Signed)
[facility] - Berkley Harvey #[id number] - exp 06/30/2023 & [facility] healthy blue Berkley Harvey: [id number] exp. 05/16/23-07/14/23 sent to GI [phone]

## 2023-05-30 ENCOUNTER — Other Ambulatory Visit (HOSPITAL_COMMUNITY): Payer: Self-pay

## 2023-07-10 ENCOUNTER — Encounter: Payer: Self-pay | Admitting: Internal Medicine

## 2023-07-10 DIAGNOSIS — G4733 Obstructive sleep apnea (adult) (pediatric): Secondary | ICD-10-CM | POA: Insufficient documentation

## 2023-08-02 DIAGNOSIS — F419 Anxiety disorder, unspecified: Secondary | ICD-10-CM | POA: Diagnosis not present

## 2023-08-02 DIAGNOSIS — F988 Other specified behavioral and emotional disorders with onset usually occurring in childhood and adolescence: Secondary | ICD-10-CM | POA: Diagnosis not present

## 2023-08-02 DIAGNOSIS — F4310 Post-traumatic stress disorder, unspecified: Secondary | ICD-10-CM | POA: Diagnosis not present

## 2023-08-02 DIAGNOSIS — F339 Major depressive disorder, recurrent, unspecified: Secondary | ICD-10-CM | POA: Diagnosis not present

## 2023-08-10 DIAGNOSIS — G932 Benign intracranial hypertension: Secondary | ICD-10-CM | POA: Diagnosis not present

## 2023-08-16 DIAGNOSIS — C9210 Chronic myeloid leukemia, BCR/ABL-positive, not having achieved remission: Secondary | ICD-10-CM | POA: Diagnosis not present

## 2023-08-18 DIAGNOSIS — G4700 Insomnia, unspecified: Secondary | ICD-10-CM | POA: Diagnosis not present

## 2023-08-18 DIAGNOSIS — G4733 Obstructive sleep apnea (adult) (pediatric): Secondary | ICD-10-CM | POA: Diagnosis not present

## 2023-08-23 DIAGNOSIS — H938X3 Other specified disorders of ear, bilateral: Secondary | ICD-10-CM | POA: Diagnosis not present

## 2023-08-23 DIAGNOSIS — C9210 Chronic myeloid leukemia, BCR/ABL-positive, not having achieved remission: Secondary | ICD-10-CM | POA: Diagnosis not present

## 2023-08-23 DIAGNOSIS — E079 Disorder of thyroid, unspecified: Secondary | ICD-10-CM | POA: Diagnosis not present

## 2023-08-23 DIAGNOSIS — R590 Localized enlarged lymph nodes: Secondary | ICD-10-CM | POA: Diagnosis not present

## 2023-08-24 DIAGNOSIS — R079 Chest pain, unspecified: Secondary | ICD-10-CM | POA: Diagnosis not present

## 2023-08-24 DIAGNOSIS — R55 Syncope and collapse: Secondary | ICD-10-CM | POA: Diagnosis not present

## 2023-08-24 DIAGNOSIS — R072 Precordial pain: Secondary | ICD-10-CM | POA: Diagnosis not present

## 2023-08-24 DIAGNOSIS — R42 Dizziness and giddiness: Secondary | ICD-10-CM | POA: Diagnosis not present

## 2023-08-25 DIAGNOSIS — H938X3 Other specified disorders of ear, bilateral: Secondary | ICD-10-CM | POA: Diagnosis not present

## 2023-08-25 DIAGNOSIS — Z0110 Encounter for examination of ears and hearing without abnormal findings: Secondary | ICD-10-CM | POA: Diagnosis not present

## 2023-08-25 DIAGNOSIS — Z77122 Contact with and (suspected) exposure to noise: Secondary | ICD-10-CM | POA: Diagnosis not present

## 2023-08-25 DIAGNOSIS — H9319 Tinnitus, unspecified ear: Secondary | ICD-10-CM | POA: Diagnosis not present

## 2023-08-29 DIAGNOSIS — F332 Major depressive disorder, recurrent severe without psychotic features: Secondary | ICD-10-CM | POA: Diagnosis not present

## 2023-08-29 DIAGNOSIS — G479 Sleep disorder, unspecified: Secondary | ICD-10-CM | POA: Diagnosis not present

## 2023-08-29 DIAGNOSIS — F4310 Post-traumatic stress disorder, unspecified: Secondary | ICD-10-CM | POA: Diagnosis not present

## 2023-08-29 DIAGNOSIS — I889 Nonspecific lymphadenitis, unspecified: Secondary | ICD-10-CM | POA: Diagnosis not present

## 2023-08-29 DIAGNOSIS — C9210 Chronic myeloid leukemia, BCR/ABL-positive, not having achieved remission: Secondary | ICD-10-CM | POA: Diagnosis not present

## 2023-08-29 DIAGNOSIS — R52 Pain, unspecified: Secondary | ICD-10-CM | POA: Diagnosis not present

## 2023-08-29 DIAGNOSIS — F411 Generalized anxiety disorder: Secondary | ICD-10-CM | POA: Diagnosis not present

## 2023-08-30 DIAGNOSIS — F419 Anxiety disorder, unspecified: Secondary | ICD-10-CM | POA: Diagnosis not present

## 2023-08-30 DIAGNOSIS — F988 Other specified behavioral and emotional disorders with onset usually occurring in childhood and adolescence: Secondary | ICD-10-CM | POA: Diagnosis not present

## 2023-08-30 DIAGNOSIS — F339 Major depressive disorder, recurrent, unspecified: Secondary | ICD-10-CM | POA: Diagnosis not present

## 2023-08-30 DIAGNOSIS — F4310 Post-traumatic stress disorder, unspecified: Secondary | ICD-10-CM | POA: Diagnosis not present

## 2023-09-01 DIAGNOSIS — C9210 Chronic myeloid leukemia, BCR/ABL-positive, not having achieved remission: Secondary | ICD-10-CM | POA: Diagnosis not present

## 2023-09-01 DIAGNOSIS — R590 Localized enlarged lymph nodes: Secondary | ICD-10-CM | POA: Diagnosis not present

## 2023-09-05 DIAGNOSIS — H9313 Tinnitus, bilateral: Secondary | ICD-10-CM | POA: Insufficient documentation

## 2023-09-06 DIAGNOSIS — R590 Localized enlarged lymph nodes: Secondary | ICD-10-CM | POA: Diagnosis not present

## 2023-09-06 DIAGNOSIS — H938X3 Other specified disorders of ear, bilateral: Secondary | ICD-10-CM | POA: Diagnosis not present

## 2023-09-12 DIAGNOSIS — R072 Precordial pain: Secondary | ICD-10-CM | POA: Diagnosis not present

## 2023-09-14 DIAGNOSIS — M7918 Myalgia, other site: Secondary | ICD-10-CM | POA: Diagnosis not present

## 2023-09-14 DIAGNOSIS — M792 Neuralgia and neuritis, unspecified: Secondary | ICD-10-CM | POA: Diagnosis not present

## 2023-09-14 DIAGNOSIS — M545 Low back pain, unspecified: Secondary | ICD-10-CM | POA: Diagnosis not present

## 2023-09-14 DIAGNOSIS — G894 Chronic pain syndrome: Secondary | ICD-10-CM | POA: Diagnosis not present

## 2023-09-15 ENCOUNTER — Ambulatory Visit: Payer: Self-pay | Admitting: Internal Medicine

## 2023-09-15 NOTE — Telephone Encounter (Signed)
Copied from CRM [id number]. Topic: Clinical - Red Word Triage >> Sep 15, 2023  8:40 AM [place] E wrote: Patient stating that she has a PET scan on the 19th of this month, but she is also experiencing other symptoms. Symptoms include a swollen lymph node in her chest that is hardening, bladder and bowel issues, muscle pain and weakness. Was in ER 2 weeks ago for symptoms but not really getting better   Chief Complaint: concern for body deteriorating Symptoms: lymph nodes hardening and more popping up swollen across body, migraines with blind spots, voice changes, hard to swallow, tasted metal/blood, frothy urine, burning and straining with urination, urinary urgency, pain with BM, changes to BM appearance, blood clots passed from bowel and vagina, swelling throughout body, unresolved pain, itchy/dry/cracking/rashy skin, orange peel appearance to breasts, stating in an even tone "feel like I'm straight up dying, not gonna lie to you, feel worse and worse and worse" Frequency: chronic Pertinent Negatives: Patient denies SOB, chest pain, confusion Disposition: [] 911 / [] ED /[] Urgent Care (no appt availability in office) / [] Appointment(In office/virtual)/ []  [place] [facility]/ [] Home Care/ [] Refused Recommended Disposition /[] [place] [facility]/ [x]  Follow-up with PCP Additional Notes: Pt reporting she has "multiple lymph nodes that are hardening for which she has "gone to specialist, not done anything about it, Dr. Lawerance Bach has put it on specialist and oncologist," but they are "putting [it back] on her" to assess pt overall rather than localized body systems. Pt reporting that she has swollen/hardening lymph nodes "in neck, chest, underarms, and back, new ones in chest are very worrisome, new ones in neck are very worrisome, other ones not as big" that are "everywhere, chest, abdomen, groin, everywhere." Pt reporting she was seen in ER for symptoms 2 weeks ago but "never really seen by anyone at ER  that knew what they were doing" with her case, no resolution from ED visit. Pt also reporting experiencing "migraines with blind spots, voice changes, hard to swallow, tasted metal/blood, urine/bowel problems everyday" with "frothy, bubbly" urine and urgency, also "straining to pee, burning" with urination, stating this is "chronic," as well as "black and blood embedded in stool and blood on toilet paper," having "extreme pain" with BM, having "pressure spots in vision" when having BM, "trouble going to bathroom, still feel like have to go," BM's are "very skinny or jagged," and symptoms all "definitely getting worse." Pt reporting "passing blood clots" through bowel and vaginally "after laparoscopic surgery" for endometriosis, "gotten scans" for this that show "fluid," and pt states she is overall more swollen "everywhere - chest, neck, upper body, abdomen, breasts, legs swollen, face swollen." Pt reporting she has "a lot going on, a lot of inflammation constantly, these things are getting worse, nothing - PT or trigger point therapy, acupuncture - nothing even touched the pain, hurts to bend over," and other positional changes. Pt reporting she also has had a "rash, very itchy and dry cracking skin, rash of pinpoints either red or white, all over chest, breasts, abdomen, arms, legs," and "breasts have white flaky dots, no scabs," and "dimpley looking things like orange peel" with "little white things coming out of it, not pimple, itch it and become red, don't bleed but have underbleeding dots and kind of becomes a scab." Pt reporting that she "have specialists and they say need to go to PCP for all of it." Pt's primary concern at this time is that she has a PET scan scheduled for Wednesday 09/21/23 and "oncologist  scheduled it for" only localized parts of body, but pt reporting that symptoms have "been going on for months, if not years, want EVERYTHING looked over, need the order altered" so will test for all  things, "need PCP to help me get looked at as whole person not just neck," etc. 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, been in remission" but even when told remission, "symptoms continually gotten worse, think something was missed when I got diagnosed with this cancer." Pt reporting they "didn't test for everything, a lot of these symptoms occurring from before," and again in even tone with no crying, pt stating, "I feel like I'm straight up dying, not gonna lie to you, I feel worse and worse and worse, nothing has worked." Advised pt to go to hospital if immediate worsening, any SOB, chest pain, feeling out of it/off, any immediate changes, advised that HP message being sent to office to inquire about changing order and getting pt assessed head to toe before PET scan, told pt to look for call back from office today for next best steps. Pt verbalized understanding. Nurse requesting stat feedback to pt from Dr. Lawerance Bach if at all possible, even if calling her while out of office. Pt needs immediate attention from doc who knows her hx, nurse called [facility] and spoke with front desk to inquire about appt within next couple days and have clinical staff made aware of incoming urgent message, advise pt be seen today if at all possible - please have the willing doc review hx and advise where possible.  Reason for Disposition  [1] Purple or blood-colored rash (spots or dots) AND [2] no fever AND [3] sounds well to triager  Answer Assessment - Initial Assessment Questions 1. MAIN CONCERN OR SYMPTOM:  "What's the main symptom you're worried about?" (e.g., itching, skin rash, redness). If there is a rash: "Please describe it" (e.g., blisters, redness, sores; location, widespread)      Multiple lymph nodes that are hardening, gone to specialist, not done anything about it, Dr. Lawerance Bach has put it on specialist and oncologist, putting that on her, in neck, chest,  underarms, and back, new ones in chest and neck are very worrisome, other ones not as big everywhere, chest, abdomen, groin, everywhere. Never real seen by anyone at ER that knew what they were doing. Migraines with blind spots, voice changes, hard to swallow, tasted metal/blood, urine/bowel problems everyday and frothy, bubbly, urgency, straining to pee, burning to pee, chronic, black and blood in stool and toilet paper, extreme pain with BM, pressure spots in vision, trouble going to bathroom, still feel like have to go very skinny or jagged, definitely getting worse, blood clots bowel and vaginally after lap surgery. Gotten scans with fluid, more swollen over and over everywhere chest neck upper body abdomen breasts, legs swollen, face swollen. A lot going on, a lot of inflammation constantly, these things getting worse, nothing PT or trigger point therapy, acupuncture, nothing even touched the pain, hurts to bend over, etc, rash very itchy and dry cracking skin, rash of pinpoints either red or white, all over chest breast abdomen arms legs, breasts have white flaky dots no scabs, dimpley looking things like orange pills little white things coming out of it, not pimple, itch it and become red, don't bleed but have underbleeding dots and kind of becomes a scab. Have specialists and say need to go to PCP for all  of it. PET scan scheduled for Wednesday, oncologist scheduled it for , been going on for months if not years, want EVERYTHING looked over, need order altered so will test for all things rather than localized from oncologist, need . Need PCP to help me get looked at as whole person not just neck, etc. Feel 10x worse than when got diagnosed with leukemia been in remission symptoms continually gotten worse, think something missed when diagnosed with this cancer. Didn't test for everything, a lot of these symptoms occurring from before, feel like I'm straight up dying not gonna lie to you, feel worse and worse  and worse, nothing has worked 2. ONSET: "When did the worsening symptoms  start?"     For months 3. NEW-BETTER-SAME-WORSE: "Is this new since your last doctor (or NP/PA) visit?" If not new, "Are you getting better, staying the same, or getting worse compared to how you felt at your last visit to the doctor?"     Worse and worse and worse 4. PAIN: "Is there any pain?" If Yes, ask: "How bad is it?"  (Scale 1-10; or mild, moderate, severe)     Nothing even touched the pain 5. FEVER: "Do you have a fever?" If Yes, ask: "What is it, how was it measured and when did it start?"     denies 6. CAUSE: "What do you think is causing the rash?"     Feel 10x worse than when got diagnosed with leukemia been in remission symptoms continually gotten worse, think something missed when diagnosed with this cancer. Didn't test for everything, a lot of these symptoms occurring from before, feel like I'm straight up dying not gonna lie to you, feel worse and worse and worse, nothing has worked 9. CANCER - TREATMENT: "What cancer treatments have you received?" "When did you last take or receive them?" (e.g., recent surgery, radiation, immunotherapy, bone marrow transplant, or chemotherapy). Note: If triager has access to the patient's medical record, triager should review treatments and administration dates. If the patient has had a bone marrow transplant, determine if the patient is on immunosuppressant medicines.     Remission as of spring 2023  Protocols used: Cancer - Skin Symptoms and Questions-A-AH

## 2023-09-15 NOTE — Telephone Encounter (Signed)
Chief Complaint: Enlarged lymph nodes Symptoms: Skin rash, headache, GI issues (has IBS, narrow stools not emptying fully) Frequency: Ongoing and getting progressively worse Pertinent Negatives: Patient denies fever, runny nose Disposition: [] ED /[] Urgent Care (no appt availability in office) / [x] Appointment(In office/virtual)/ []  [facility]/ [] Home Care/ [] Refused Recommended Disposition /[] [facility]/ []  Follow-up with PCP Additional Notes: Spoke with pt mom, Toniann Fail. Pt has a PET scan scheduled for Wednesday. Pt would like to be seen before her PET scan. The symptoms have been going on for months and getting worse over past few weeks. Pt scheduled for an appt tomorrow. This RN educated pt mom on home care, new-worsening symptoms, when to call back/seek emergent care. Pt mom verbalized understanding and agrees to plan.    Copied from CRM [id number]. Topic: Clinical - Red Word Triage >> Sep 15, 2023 12:44 PM Larwance Sachs wrote: Red Word that prompted transfer to Nurse Triage: Patient representative is on line interested in patient  being seen regarding - back pain, harden lymph nodes, severe headaches, joint pain, gi issues before pet scan next Wednesday Reason for Disposition  [1] Overlying skin is red AND [2] no fever  Answer Assessment - Initial Assessment Questions 1. LOCATION: "Where is the swollen node located?" "Is the matching node on the other side of the body also swollen?"      Different areas- some on neck 3. ONSET: "When did the swelling start?"      A couple months 4. NECK NODES: "Is there a sore throat, runny nose or other symptoms of a cold?"      Denies, scabs when blow nose 6. FEVER: "Do you have a fever?" If Yes, ask: "What is it, how was it measured, and when did it start?"      Denies, body does get hot but not a fever  Protocols used: Lymph Nodes - Swollen-A-AH

## 2023-09-16 ENCOUNTER — Encounter: Payer: Self-pay | Admitting: Family Medicine

## 2023-09-16 ENCOUNTER — Ambulatory Visit: Payer: 59 | Admitting: Family Medicine

## 2023-09-16 VITALS — BP 108/82 | HR 80 | Temp 97.5°F | Ht 67.0 in | Wt 251.0 lb

## 2023-09-16 DIAGNOSIS — R21 Rash and other nonspecific skin eruption: Secondary | ICD-10-CM

## 2023-09-16 DIAGNOSIS — M2550 Pain in unspecified joint: Secondary | ICD-10-CM | POA: Diagnosis not present

## 2023-09-16 DIAGNOSIS — C9210 Chronic myeloid leukemia, BCR/ABL-positive, not having achieved remission: Secondary | ICD-10-CM

## 2023-09-16 DIAGNOSIS — M7918 Myalgia, other site: Secondary | ICD-10-CM | POA: Diagnosis not present

## 2023-09-16 DIAGNOSIS — R591 Generalized enlarged lymph nodes: Secondary | ICD-10-CM | POA: Diagnosis not present

## 2023-09-16 DIAGNOSIS — R5382 Chronic fatigue, unspecified: Secondary | ICD-10-CM | POA: Diagnosis not present

## 2023-09-16 NOTE — Progress Notes (Signed)
Acute Office Visit  Subjective:     Patient ID: Desiree Carlson, female    DOB: 12-08-1995, 28 y.o.   MRN: [id number]  Chief Complaint  Patient presents with   Acute Visit    PET scan on Wednesday    HPI Patient is in today for evaluation of intermittent rash, chronic fatigue, swollen lymph node to the left neck and left chest, muscle and joint pain. States the symptoms have been going on for the last 2 years. Has history of CML, frequent infections, elevated inflammatory markers, chronic fatigue. Has a multitude of specialist that she sees as well. Has PET scan scheduled for next Wednesday. Reports that she also has GI issues that include abdominal pain, sometimes there is blood or mucus in her stool constipation. Has had these worked up with no obvious cause in the past. Has never seen rheumatology or immunology in the past. Denies other concerns today. She is accompanied by her mother today.    ROS Per HPI      Objective:    BP 108/82 (BP Location: Left Arm, Patient Position: Sitting)   Pulse 80   Temp (!) 97.5 F (36.4 C) (Oral)   Ht 5\' 7"  (1.702 m)   Wt 251 lb (113.9 kg)   SpO2 97%   BMI 39.31 kg/m    Physical Exam Vitals and nursing note reviewed.  Constitutional:      General: She is not in acute distress.    Appearance: Normal appearance. She is normal weight.  HENT:     Head: Normocephalic and atraumatic.     Nose: Nose normal.  Eyes:     Extraocular Movements: Extraocular movements intact.  Neck:      Comments: Area of lymphadenopathy Cardiovascular:     Rate and Rhythm: Normal rate and regular rhythm.     Heart sounds: Normal heart sounds.  Pulmonary:     Effort: Pulmonary effort is normal. No respiratory distress.     Breath sounds: Normal breath sounds. No wheezing, rhonchi or rales.  Chest:       Comments: Nodular lump no discharge Musculoskeletal:        General: Swelling and tenderness present.       Arms:     Cervical back:  Normal range of motion.     Comments: Areas of mild swelling, tenderness, consistent with muscle strain  Lymphadenopathy:     Cervical: Cervical adenopathy present.     Right cervical: Deep cervical adenopathy present.  Skin:    General: Skin is warm and dry.     Findings: Rash present.          Comments: Area of erythematous, scaly rash, no satellite lesions, non tender, no discharge, no bleeding  Neurological:     General: No focal deficit present.     Mental Status: She is alert and oriented to person, place, and time.  Psychiatric:        Mood and Affect: Mood normal.        Thought Content: Thought content normal.    No results found for any visits on 09/16/23.      Assessment & Plan:  1. Lymphadenopathy (Primary)  - Ambulatory referral to Immunology  2. Rash  - Ambulatory referral to Dermatology  3. CML (chronic myeloid leukemia) (HCC)  - Ambulatory referral to Immunology  4. Myofascial pain syndrome - pain management at [facility] - Ref to rheum  5. Chronic fatigue  - Ambulatory referral to Rheumatology - Ambulatory  referral to Immunology  6. Arthralgia, unspecified joint  - Ambulatory referral to Rheumatology   No orders of the defined types were placed in this encounter.  Spent 60 minutes with patient encounter including face-to-face time breath, record review, documentation time  Return if symptoms worsen or fail to improve.  Moshe Cipro, FNP

## 2023-09-16 NOTE — Patient Instructions (Addendum)
Nadine Counts and Nida Boatman on youtube for PT videos  Lymphatic drainage yoga, massage  Referral to rheumatology  Referral to immunology  Referral to dermatology

## 2023-09-18 DIAGNOSIS — G4700 Insomnia, unspecified: Secondary | ICD-10-CM | POA: Diagnosis not present

## 2023-09-18 DIAGNOSIS — G4733 Obstructive sleep apnea (adult) (pediatric): Secondary | ICD-10-CM | POA: Diagnosis not present

## 2023-09-21 DIAGNOSIS — R590 Localized enlarged lymph nodes: Secondary | ICD-10-CM | POA: Diagnosis not present

## 2023-09-21 DIAGNOSIS — C9210 Chronic myeloid leukemia, BCR/ABL-positive, not having achieved remission: Secondary | ICD-10-CM | POA: Diagnosis not present

## 2023-09-23 DIAGNOSIS — R21 Rash and other nonspecific skin eruption: Secondary | ICD-10-CM | POA: Diagnosis not present

## 2023-09-23 DIAGNOSIS — C9210 Chronic myeloid leukemia, BCR/ABL-positive, not having achieved remission: Secondary | ICD-10-CM | POA: Diagnosis not present

## 2023-09-27 DIAGNOSIS — F4310 Post-traumatic stress disorder, unspecified: Secondary | ICD-10-CM | POA: Diagnosis not present

## 2023-09-27 DIAGNOSIS — F419 Anxiety disorder, unspecified: Secondary | ICD-10-CM | POA: Diagnosis not present

## 2023-09-27 DIAGNOSIS — F988 Other specified behavioral and emotional disorders with onset usually occurring in childhood and adolescence: Secondary | ICD-10-CM | POA: Diagnosis not present

## 2023-09-27 DIAGNOSIS — F4329 Adjustment disorder with other symptoms: Secondary | ICD-10-CM | POA: Diagnosis not present

## 2023-09-27 DIAGNOSIS — F339 Major depressive disorder, recurrent, unspecified: Secondary | ICD-10-CM | POA: Diagnosis not present

## 2023-09-28 DIAGNOSIS — G4733 Obstructive sleep apnea (adult) (pediatric): Secondary | ICD-10-CM | POA: Diagnosis not present

## 2023-09-28 DIAGNOSIS — E669 Obesity, unspecified: Secondary | ICD-10-CM | POA: Diagnosis not present

## 2023-09-28 DIAGNOSIS — R5383 Other fatigue: Secondary | ICD-10-CM | POA: Diagnosis not present

## 2023-09-29 DIAGNOSIS — K5909 Other constipation: Secondary | ICD-10-CM | POA: Diagnosis not present

## 2023-09-29 DIAGNOSIS — C9210 Chronic myeloid leukemia, BCR/ABL-positive, not having achieved remission: Secondary | ICD-10-CM | POA: Diagnosis not present

## 2023-09-29 DIAGNOSIS — K219 Gastro-esophageal reflux disease without esophagitis: Secondary | ICD-10-CM | POA: Diagnosis not present

## 2023-09-29 DIAGNOSIS — K5900 Constipation, unspecified: Secondary | ICD-10-CM | POA: Diagnosis not present

## 2023-09-29 DIAGNOSIS — K921 Melena: Secondary | ICD-10-CM | POA: Diagnosis not present

## 2023-09-29 DIAGNOSIS — R590 Localized enlarged lymph nodes: Secondary | ICD-10-CM | POA: Diagnosis not present

## 2023-09-30 DIAGNOSIS — F411 Generalized anxiety disorder: Secondary | ICD-10-CM | POA: Diagnosis not present

## 2023-09-30 DIAGNOSIS — G479 Sleep disorder, unspecified: Secondary | ICD-10-CM | POA: Diagnosis not present

## 2023-09-30 DIAGNOSIS — F332 Major depressive disorder, recurrent severe without psychotic features: Secondary | ICD-10-CM | POA: Diagnosis not present

## 2023-09-30 DIAGNOSIS — R52 Pain, unspecified: Secondary | ICD-10-CM | POA: Diagnosis not present

## 2023-09-30 DIAGNOSIS — F4310 Post-traumatic stress disorder, unspecified: Secondary | ICD-10-CM | POA: Diagnosis not present

## 2023-10-11 DIAGNOSIS — F4329 Adjustment disorder with other symptoms: Secondary | ICD-10-CM | POA: Diagnosis not present

## 2023-10-14 DIAGNOSIS — C9210 Chronic myeloid leukemia, BCR/ABL-positive, not having achieved remission: Secondary | ICD-10-CM | POA: Diagnosis not present

## 2023-10-14 DIAGNOSIS — R21 Rash and other nonspecific skin eruption: Secondary | ICD-10-CM | POA: Diagnosis not present

## 2023-10-16 DIAGNOSIS — G4700 Insomnia, unspecified: Secondary | ICD-10-CM | POA: Diagnosis not present

## 2023-10-16 DIAGNOSIS — G4733 Obstructive sleep apnea (adult) (pediatric): Secondary | ICD-10-CM | POA: Diagnosis not present

## 2023-10-20 DIAGNOSIS — G932 Benign intracranial hypertension: Secondary | ICD-10-CM | POA: Diagnosis not present

## 2023-10-21 ENCOUNTER — Encounter: Payer: Self-pay | Admitting: Family Medicine

## 2023-10-24 DIAGNOSIS — F419 Anxiety disorder, unspecified: Secondary | ICD-10-CM | POA: Diagnosis not present

## 2023-10-24 DIAGNOSIS — F4310 Post-traumatic stress disorder, unspecified: Secondary | ICD-10-CM | POA: Diagnosis not present

## 2023-10-24 DIAGNOSIS — F339 Major depressive disorder, recurrent, unspecified: Secondary | ICD-10-CM | POA: Diagnosis not present

## 2023-10-24 DIAGNOSIS — F988 Other specified behavioral and emotional disorders with onset usually occurring in childhood and adolescence: Secondary | ICD-10-CM | POA: Diagnosis not present

## 2023-10-26 DIAGNOSIS — R635 Abnormal weight gain: Secondary | ICD-10-CM | POA: Diagnosis not present

## 2023-10-26 DIAGNOSIS — R948 Abnormal results of function studies of other organs and systems: Secondary | ICD-10-CM | POA: Diagnosis not present

## 2023-10-27 DIAGNOSIS — F4329 Adjustment disorder with other symptoms: Secondary | ICD-10-CM | POA: Diagnosis not present

## 2023-10-31 DIAGNOSIS — J4520 Mild intermittent asthma, uncomplicated: Secondary | ICD-10-CM | POA: Diagnosis not present

## 2023-10-31 DIAGNOSIS — L501 Idiopathic urticaria: Secondary | ICD-10-CM | POA: Diagnosis not present

## 2023-10-31 DIAGNOSIS — R21 Rash and other nonspecific skin eruption: Secondary | ICD-10-CM | POA: Diagnosis not present

## 2023-10-31 DIAGNOSIS — J310 Chronic rhinitis: Secondary | ICD-10-CM | POA: Diagnosis not present

## 2023-10-31 DIAGNOSIS — H109 Unspecified conjunctivitis: Secondary | ICD-10-CM | POA: Diagnosis not present

## 2023-11-08 DIAGNOSIS — D229 Melanocytic nevi, unspecified: Secondary | ICD-10-CM | POA: Diagnosis not present

## 2023-11-08 DIAGNOSIS — L309 Dermatitis, unspecified: Secondary | ICD-10-CM | POA: Diagnosis not present

## 2023-11-08 DIAGNOSIS — D239 Other benign neoplasm of skin, unspecified: Secondary | ICD-10-CM | POA: Diagnosis not present

## 2023-11-08 DIAGNOSIS — L501 Idiopathic urticaria: Secondary | ICD-10-CM | POA: Diagnosis not present

## 2023-11-08 DIAGNOSIS — R21 Rash and other nonspecific skin eruption: Secondary | ICD-10-CM | POA: Diagnosis not present

## 2023-11-10 DIAGNOSIS — F4329 Adjustment disorder with other symptoms: Secondary | ICD-10-CM | POA: Diagnosis not present

## 2023-11-14 DIAGNOSIS — R768 Other specified abnormal immunological findings in serum: Secondary | ICD-10-CM | POA: Diagnosis not present

## 2023-11-14 DIAGNOSIS — M898X9 Other specified disorders of bone, unspecified site: Secondary | ICD-10-CM | POA: Diagnosis not present

## 2023-11-14 DIAGNOSIS — C9210 Chronic myeloid leukemia, BCR/ABL-positive, not having achieved remission: Secondary | ICD-10-CM | POA: Diagnosis not present

## 2023-11-24 DIAGNOSIS — F4329 Adjustment disorder with other symptoms: Secondary | ICD-10-CM | POA: Diagnosis not present

## 2023-11-25 DIAGNOSIS — F4310 Post-traumatic stress disorder, unspecified: Secondary | ICD-10-CM | POA: Diagnosis not present

## 2023-11-25 DIAGNOSIS — F988 Other specified behavioral and emotional disorders with onset usually occurring in childhood and adolescence: Secondary | ICD-10-CM | POA: Diagnosis not present

## 2023-11-25 DIAGNOSIS — F339 Major depressive disorder, recurrent, unspecified: Secondary | ICD-10-CM | POA: Diagnosis not present

## 2023-11-25 DIAGNOSIS — F419 Anxiety disorder, unspecified: Secondary | ICD-10-CM | POA: Diagnosis not present

## 2023-11-28 DIAGNOSIS — G4733 Obstructive sleep apnea (adult) (pediatric): Secondary | ICD-10-CM | POA: Diagnosis not present

## 2023-11-28 DIAGNOSIS — F988 Other specified behavioral and emotional disorders with onset usually occurring in childhood and adolescence: Secondary | ICD-10-CM | POA: Diagnosis not present

## 2023-11-28 DIAGNOSIS — F419 Anxiety disorder, unspecified: Secondary | ICD-10-CM | POA: Diagnosis not present

## 2023-11-28 DIAGNOSIS — K219 Gastro-esophageal reflux disease without esophagitis: Secondary | ICD-10-CM | POA: Diagnosis not present

## 2023-11-28 DIAGNOSIS — C9210 Chronic myeloid leukemia, BCR/ABL-positive, not having achieved remission: Secondary | ICD-10-CM | POA: Diagnosis not present

## 2023-11-28 DIAGNOSIS — E669 Obesity, unspecified: Secondary | ICD-10-CM | POA: Diagnosis not present

## 2023-11-28 DIAGNOSIS — K589 Irritable bowel syndrome without diarrhea: Secondary | ICD-10-CM | POA: Diagnosis not present

## 2023-11-28 DIAGNOSIS — F32A Depression, unspecified: Secondary | ICD-10-CM | POA: Diagnosis not present

## 2023-11-28 DIAGNOSIS — M797 Fibromyalgia: Secondary | ICD-10-CM | POA: Diagnosis not present

## 2023-11-28 DIAGNOSIS — N809 Endometriosis, unspecified: Secondary | ICD-10-CM | POA: Diagnosis not present

## 2023-11-30 DIAGNOSIS — E669 Obesity, unspecified: Secondary | ICD-10-CM | POA: Diagnosis not present

## 2023-11-30 DIAGNOSIS — Z713 Dietary counseling and surveillance: Secondary | ICD-10-CM | POA: Diagnosis not present

## 2023-12-13 ENCOUNTER — Encounter: Payer: Self-pay | Admitting: Internal Medicine

## 2023-12-13 NOTE — Progress Notes (Unsigned)
 Subjective:    Patient ID: Desiree Carlson, female    DOB: [birth year], 28 y.o.   MRN: [id number]      HPI Desiree Carlson is here for No chief complaint on file.    Head and neck pain -     Medications and allergies reviewed with patient and updated if appropriate.  Current Outpatient Medications on File Prior to Visit  Medication Sig Dispense Refill   albuterol  (VENTOLIN  HFA) 108 (90 Base) MCG/ACT inhaler Inhale 1-2 puffs into the lungs every 6 (six) hours as needed for wheezing or shortness of breath. 6.7 g 5   ALPRAZolam (XANAX) 0.5 MG tablet TAKE 1 TABLET BY MOUTH TWICE DAILY AS NEEDED FOR ANXIETY FOR 30 DAYS     amitriptyline  (ELAVIL ) 50 MG tablet Take 75 mg by mouth at bedtime. 75 mg     amphetamine-dextroamphetamine (ADDERALL XR) 20 MG 24 hr capsule Take 1 capsule by mouth every morning. 30 mg (2 15mg  pills from shortage)     asciminib hcl (SCEMBLIX) 40 MG tablet Take by mouth.     budesonide -formoterol  (SYMBICORT ) 160-4.5 MCG/ACT inhaler Inhale 2 puffs into the lungs 2 (two) times daily as needed. 10.2 g 8   clobetasol  ointment (TEMOVATE ) 0.05 % Apply 1 Application topically 2 (two) times daily. Place in a thin layer of the affected areas twice a day for 2 weeks.  Then place on the affected areas twice a week at bedtime. 60 g 0   diazepam  (VALIUM ) 5 MG tablet Place 1 tablet vaginally nightly as needed for muscle spasm/ pelvic pain. 30 tablet 1   diclofenac Sodium (VOLTAREN) 1 % GEL as needed.     fluticasone (FLONASE) 50 MCG/ACT nasal spray 1 spray into each nostril daily.     hydrOXYzine  (ATARAX ) 50 MG tablet TAKE 1 TABLET BY MOUTH EVERYDAY AT BEDTIME (Patient not taking: Reported on 09/16/2023) 90 tablet 2   ibuprofen  (ADVIL ) 200 MG tablet Take by mouth as needed.     lidocaine  (XYLOCAINE ) 2 % solution  (Patient not taking: Reported on 09/16/2023)     loperamide (IMODIUM) 2 MG capsule Take 1 capsule (2 mg) total by mouth once daily as needed for diarrhea.     loratadine  (CLARITIN) 10 MG tablet Take by mouth.     montelukast  (SINGULAIR ) 10 MG tablet Take 1 tablet (10 mg total) by mouth daily. Annual appt due in Sept must see provider for future refills 90 tablet 0   norethindrone  (ORTHO MICRONOR ) 0.35 MG tablet Take 1 tablet (0.35 mg total) by mouth daily. (Patient not taking: Reported on 09/16/2023) 28 tablet 11   norethindrone -ethinyl estradiol -iron (LOESTRIN FE) 1.5-30 MG-MCG tablet Take 1 tablet by mouth daily. (Patient not taking: Reported on 09/16/2023)     nystatin  (MYCOSTATIN /NYSTOP ) powder Apply 1 Application topically 3 (three) times daily. Apply to affected area for up to 7 days 15 g 2   nystatin  ointment (MYCOSTATIN ) Apply 1 Application topically 2 (two) times daily. Apply to affected area for up to 7 days. 30 g 1   ondansetron  (ZOFRAN ) 4 MG tablet Take 1 tablet by mouth up to two times a day as needed for nausea. Second choice.     phenazopyridine  (PYRIDIUM ) 95 MG tablet Take 1 tablet (95 mg total) by mouth 3 (three) times daily as needed for pain. (Patient not taking: Reported on 09/16/2023) 30 tablet 1   prochlorperazine  (COMPAZINE ) 10 MG tablet Take by mouth.     promethazine -dextromethorphan (PROMETHAZINE -DM) 6.25-15 MG/5ML syrup Take 5  mLs by mouth 4 (four) times daily as needed. 150 mL 0   SUMAtriptan  (IMITREX ) 100 MG tablet Take 1 tablet (100 mg total) by mouth every 2 (two) hours as needed for migraine. May repeat in 2 hours if headache persists or recurs. 10 tablet 6   No current facility-administered medications on file prior to visit.    Review of Systems     Objective:  There were no vitals filed for this visit. BP Readings from Last 3 Encounters:  09/16/23 108/82  05/17/23 106/64  05/14/23 105/69   Wt Readings from Last 3 Encounters:  09/16/23 251 lb (113.9 kg)  05/17/23 248 lb (112.5 kg)  05/13/23 239 lb 6.7 oz (108.6 kg)   There is no height or weight on file to calculate BMI.    Physical Exam         Assessment &  Plan:    See Problem List for Assessment and Plan of chronic medical problems.

## 2023-12-14 ENCOUNTER — Ambulatory Visit: Admitting: Internal Medicine

## 2023-12-14 VITALS — BP 104/70 | HR 100 | Temp 98.7°F | Ht 67.0 in | Wt 243.0 lb

## 2023-12-14 DIAGNOSIS — R590 Localized enlarged lymph nodes: Secondary | ICD-10-CM | POA: Insufficient documentation

## 2023-12-14 DIAGNOSIS — M2550 Pain in unspecified joint: Secondary | ICD-10-CM

## 2023-12-14 DIAGNOSIS — R1310 Dysphagia, unspecified: Secondary | ICD-10-CM | POA: Diagnosis not present

## 2023-12-14 DIAGNOSIS — G8929 Other chronic pain: Secondary | ICD-10-CM | POA: Diagnosis not present

## 2023-12-14 DIAGNOSIS — E538 Deficiency of other specified B group vitamins: Secondary | ICD-10-CM | POA: Diagnosis not present

## 2023-12-14 DIAGNOSIS — M549 Dorsalgia, unspecified: Secondary | ICD-10-CM | POA: Diagnosis not present

## 2023-12-14 NOTE — Assessment & Plan Note (Signed)
 Chronic Has chronic cervical, occipital lymphadenopathy Has seen ENT and was assured it was okay Has had imaging CML in remission and oncology advises not related to that Looks like by imaging she does have some mild lymphadenopathy, but not anything to be concerned Plans on seeing ENT again

## 2023-12-14 NOTE — Assessment & Plan Note (Signed)
 Chronic Diffuse Will be seeing rheumatology for further evaluation Will also be seeing orthopedics

## 2023-12-14 NOTE — Assessment & Plan Note (Signed)
 Chronic Has pain in her right upper back and left lower back Following with pain management Will make an appointment to see orthopedics for further evaluation of her pain and cause of her pain

## 2023-12-14 NOTE — Patient Instructions (Addendum)
      Medications changes include :   start vitamin d  2000 units daily.  Start vitamin B12 1000 mcg daily    See orthopedics at [facility] for your lower back/leg pain.     See ENT, rheumatology, GI

## 2023-12-14 NOTE — Assessment & Plan Note (Signed)
 Chronic Will be seeing ENT and will follow-up with GI

## 2023-12-14 NOTE — Assessment & Plan Note (Signed)
 B12 level was low when checked by her specialist Has not yet started B12-advised this can cause fatigue among other issues Start B12 1000 mcg daily

## 2023-12-20 DIAGNOSIS — C9210 Chronic myeloid leukemia, BCR/ABL-positive, not having achieved remission: Secondary | ICD-10-CM | POA: Diagnosis not present

## 2023-12-20 DIAGNOSIS — R52 Pain, unspecified: Secondary | ICD-10-CM | POA: Diagnosis not present

## 2023-12-20 DIAGNOSIS — H60391 Other infective otitis externa, right ear: Secondary | ICD-10-CM | POA: Diagnosis not present

## 2023-12-20 DIAGNOSIS — M791 Myalgia, unspecified site: Secondary | ICD-10-CM | POA: Diagnosis not present

## 2023-12-20 DIAGNOSIS — R21 Rash and other nonspecific skin eruption: Secondary | ICD-10-CM | POA: Diagnosis not present

## 2023-12-20 DIAGNOSIS — R5382 Chronic fatigue, unspecified: Secondary | ICD-10-CM | POA: Diagnosis not present

## 2023-12-20 DIAGNOSIS — R509 Fever, unspecified: Secondary | ICD-10-CM | POA: Diagnosis not present

## 2023-12-21 ENCOUNTER — Emergency Department (HOSPITAL_COMMUNITY)

## 2023-12-21 ENCOUNTER — Other Ambulatory Visit: Payer: Self-pay

## 2023-12-21 ENCOUNTER — Emergency Department (HOSPITAL_COMMUNITY)
Admission: EM | Admit: 2023-12-21 | Discharge: 2023-12-21 | Discharge status: Against Medical Advice | Attending: Emergency Medicine | Admitting: Emergency Medicine

## 2023-12-21 DIAGNOSIS — R042 Hemoptysis: Secondary | ICD-10-CM | POA: Diagnosis not present

## 2023-12-21 DIAGNOSIS — Z5321 Procedure and treatment not carried out due to patient leaving prior to being seen by health care provider: Secondary | ICD-10-CM | POA: Diagnosis not present

## 2023-12-21 DIAGNOSIS — R079 Chest pain, unspecified: Secondary | ICD-10-CM | POA: Diagnosis not present

## 2023-12-21 DIAGNOSIS — M542 Cervicalgia: Secondary | ICD-10-CM | POA: Insufficient documentation

## 2023-12-21 DIAGNOSIS — R0602 Shortness of breath: Secondary | ICD-10-CM | POA: Diagnosis not present

## 2023-12-21 DIAGNOSIS — R0789 Other chest pain: Secondary | ICD-10-CM | POA: Diagnosis not present

## 2023-12-21 LAB — CBC
HCT: 38.5 % (ref 36.0–46.0)
Hemoglobin: 12.9 g/dL (ref 12.0–15.0)
MCH: 27.3 pg (ref 26.0–34.0)
MCHC: 33.5 g/dL (ref 30.0–36.0)
MCV: 81.6 fL (ref 80.0–100.0)
Platelets: 369 10*3/uL (ref 150–400)
RBC: 4.72 MIL/uL (ref 3.87–5.11)
RDW: 14.1 % (ref 11.5–15.5)
WBC: 13.6 10*3/uL — ABNORMAL HIGH (ref 4.0–10.5)
nRBC: 0 % (ref 0.0–0.2)

## 2023-12-21 LAB — COMPREHENSIVE METABOLIC PANEL WITH GFR
ALT: 24 U/L (ref 0–44)
AST: 26 U/L (ref 15–41)
Albumin: 4.1 g/dL (ref 3.5–5.0)
Alkaline Phosphatase: 88 U/L (ref 38–126)
Anion gap: 11 (ref 5–15)
BUN: 8 mg/dL (ref 6–20)
CO2: 21 mmol/L — ABNORMAL LOW (ref 22–32)
Calcium: 8.9 mg/dL (ref 8.9–10.3)
Chloride: 104 mmol/L (ref 98–111)
Creatinine, Ser: 0.98 mg/dL (ref 0.44–1.00)
GFR, Estimated: >60 mL/min (ref >60)
Glucose, Bld: 93 mg/dL (ref 70–99)
Potassium: 3.6 mmol/L (ref 3.5–5.1)
Sodium: 136 mmol/L (ref 135–145)
Total Bilirubin: 0.4 mg/dL (ref 0.0–1.2)
Total Protein: 7.8 g/dL (ref 6.5–8.1)

## 2023-12-21 NOTE — ED Triage Notes (Signed)
 Pt. Stated, I was at my Dr. At [facility] for a check up for cancer leukemia. Im having chest pain, treating for UTI. I also have a nodule in my chest which . Im having bone pain and Im having an earache in both. I have a hardening in lymph node and painful. I was given Augmentin  yesterday. Ive been pushed through the cracks.

## 2023-12-21 NOTE — ED Provider Triage Note (Signed)
 Emergency Medicine Provider Triage Evaluation Note  Desiree Carlson , a 28 y.o. female  was evaluated in triage.  Pt complains of [place] complaints.  She states she has a history of CML.  She was seen at her oncology office yesterday and had a temp to 102.  She reports she had a positive viral screen for common cold.  She has ongoing bone pain.  She states that she is currently having pain in the right side of her chest up through her neck and her head which she identifies as having previously with bone pain.  However, there is more pain in the right side of her chest where she identifies that she has a lymph node.  She reports that she has been coughing up blood..  Review of Systems  Positive: Pain in chest hemoptysis fever feeling like her airway is closing Negative: Vomiting pregnancy  Physical Exam  BP (!) 147/103 (BP Location: Right Arm)   Pulse (!) 137   Temp 98.3 F (36.8 C)   Resp 18   LMP 11/18/2023   SpO2 99%  Gen:   Awake, no distress   Resp:  Normal effort  MSK:   Moves extremities without difficulty  Other:  Chest wall palpated no obvious masses noted  Medical Decision Making  Medically screening exam initiated at 9:21 AM.  Appropriate orders placed.  Desiree Carlson was informed that the remainder of the evaluation will be completed by another provider, this initial triage assessment does not replace that evaluation, and the importance of remaining in the ED until their evaluation is complete.  Patient with history of CML on oral medications.  Reports pain in chest and cough and hemoptysis.  Patient is tachycardic with normal oxygen saturations.  Will place labs and CTA.  Patient reports she is not pregnant and has endometriosis   Auston Blush, MD 12/21/23 [phone]

## 2023-12-22 DIAGNOSIS — Z0110 Encounter for examination of ears and hearing without abnormal findings: Secondary | ICD-10-CM | POA: Diagnosis not present

## 2023-12-22 DIAGNOSIS — Z856 Personal history of leukemia: Secondary | ICD-10-CM | POA: Diagnosis not present

## 2023-12-22 DIAGNOSIS — Z8616 Personal history of COVID-19: Secondary | ICD-10-CM | POA: Diagnosis not present

## 2023-12-22 DIAGNOSIS — K219 Gastro-esophageal reflux disease without esophagitis: Secondary | ICD-10-CM | POA: Diagnosis not present

## 2023-12-22 DIAGNOSIS — Z79899 Other long term (current) drug therapy: Secondary | ICD-10-CM | POA: Diagnosis not present

## 2023-12-22 DIAGNOSIS — C9210 Chronic myeloid leukemia, BCR/ABL-positive, not having achieved remission: Secondary | ICD-10-CM | POA: Diagnosis not present

## 2023-12-22 DIAGNOSIS — H938X3 Other specified disorders of ear, bilateral: Secondary | ICD-10-CM | POA: Diagnosis not present

## 2023-12-22 DIAGNOSIS — H9313 Tinnitus, bilateral: Secondary | ICD-10-CM | POA: Diagnosis not present

## 2023-12-22 DIAGNOSIS — R590 Localized enlarged lymph nodes: Secondary | ICD-10-CM | POA: Diagnosis not present

## 2023-12-29 DIAGNOSIS — F4329 Adjustment disorder with other symptoms: Secondary | ICD-10-CM | POA: Diagnosis not present

## 2024-01-02 ENCOUNTER — Ambulatory Visit: Admitting: Internal Medicine

## 2024-01-02 ENCOUNTER — Encounter: Payer: Self-pay | Admitting: Internal Medicine

## 2024-01-02 VITALS — BP 118/72 | HR 82 | Temp 99.1°F | Ht 67.0 in | Wt 245.0 lb

## 2024-01-02 DIAGNOSIS — R590 Localized enlarged lymph nodes: Secondary | ICD-10-CM

## 2024-01-02 DIAGNOSIS — M2550 Pain in unspecified joint: Secondary | ICD-10-CM

## 2024-01-02 DIAGNOSIS — R5382 Chronic fatigue, unspecified: Secondary | ICD-10-CM

## 2024-01-02 DIAGNOSIS — M791 Myalgia, unspecified site: Secondary | ICD-10-CM | POA: Diagnosis not present

## 2024-01-02 DIAGNOSIS — R109 Unspecified abdominal pain: Secondary | ICD-10-CM | POA: Diagnosis not present

## 2024-01-02 DIAGNOSIS — F339 Major depressive disorder, recurrent, unspecified: Secondary | ICD-10-CM | POA: Diagnosis not present

## 2024-01-02 DIAGNOSIS — R350 Frequency of micturition: Secondary | ICD-10-CM

## 2024-01-02 DIAGNOSIS — H938X3 Other specified disorders of ear, bilateral: Secondary | ICD-10-CM | POA: Diagnosis not present

## 2024-01-02 DIAGNOSIS — F419 Anxiety disorder, unspecified: Secondary | ICD-10-CM | POA: Diagnosis not present

## 2024-01-02 DIAGNOSIS — F988 Other specified behavioral and emotional disorders with onset usually occurring in childhood and adolescence: Secondary | ICD-10-CM | POA: Diagnosis not present

## 2024-01-02 DIAGNOSIS — F4310 Post-traumatic stress disorder, unspecified: Secondary | ICD-10-CM | POA: Diagnosis not present

## 2024-01-02 MED ORDER — PREDNISONE 20 MG PO TABS: 40.0000 mg | ORAL_TABLET | Freq: Every day | ORAL | 0 refills | Status: AC

## 2024-01-02 NOTE — Assessment & Plan Note (Signed)
 Chronic Likely multifactorial She has not been sleeping well because of increased pain which is likely contributing to increased fatigue Will start Flexeril and gabapentin at night which may help sleep Prednisone  may also help with calming down some of her pain

## 2024-01-02 NOTE — Assessment & Plan Note (Signed)
 Chronic Has chronic abdominal pain Hard to say if her chronic pain has flared but that is what it sounds like Has known endometriosis and has seen a specialist Just started having diarrhea yesterday so I do not think that is an issue Check CBC, CMP, amylase, lipase, UA, urine culture

## 2024-01-02 NOTE — Assessment & Plan Note (Signed)
 Chronic Does have interstitial cystitis Recent UA negative for infection Symptoms likely related to interstitial cystitis Will check UA, urine culture to make sure there is no infection

## 2024-01-02 NOTE — Assessment & Plan Note (Signed)
 Chronic Diffuse She feels her joint pain has flared recently with all of her other symptoms She does have an upcoming appointment with rheumatology for further evaluation Will order CBC, CMP, ANA, ESR, CRP, CCP, RF

## 2024-01-02 NOTE — Assessment & Plan Note (Signed)
 Chronic Likely related to her chronic myofascial pain Following with pain management To see rheumatology Check autoimmune blood work as above

## 2024-01-02 NOTE — Assessment & Plan Note (Signed)
 Acute With recent URI Also with some popping Likely eustachian tube dysfunction Will start prednisone  40 mg daily x 5 days Can also try Flonase Referral to ENT for her other ENT issues

## 2024-01-02 NOTE — Progress Notes (Signed)
 Subjective:    Patient ID: Desiree Carlson, female    DOB: Nov 18, 1995, 28 y.o.   MRN: [id number]      HPI Desiree Carlson is here for  Chief Complaint  Patient presents with   Generalized Body Aches  She is here with her mother.  She has numerous complaints some of which are chronic and have worsened or flared.  She saw her oncologist > 2 weeks ago -she was experiencing fever, muscle, bone pain, skull pain and pressure in forehead and posteior head, LN swollen in neck, coughing up blood, abd pain, spinal pain, masses in chest - painful and growing.  Numbness down right leg.    Pain across lower chest.  Not all of these problems are new-it does seem like they have gotten worse or flared.  She is very concerned about why she is having all these problems and these flares that she gets at times.  Not eating normally.  Feels fluid and fulless in chest/abdomen.  Intermittent hoarseness, trouble swallowing  Lower back very painful -she has a known disc protrusion.  Pain in groin.  She is not sleeping well because of the pain.  Bones, muscle and joints hurt.  Flushing.  Placed on augmentin  x 10 d.    She states there is no improvement in her symptoms.  She did test positive for rhinovirus, enterovirus   Fluid in ears, swollen ln in neck, ears popping   Diarrhea started yesterday.  Has chronic bladder symptoms-she was tested for UTI and that looked negative.    Has upcoming appts with Bariatrics, ST, psychology, rheum, oncology, neurology  Ear popping   Medications and allergies reviewed with patient and updated if appropriate.  Current Outpatient Medications on File Prior to Visit  Medication Sig Dispense Refill   albuterol  (VENTOLIN  HFA) 108 (90 Base) MCG/ACT inhaler Inhale 1-2 puffs into the lungs every 6 (six) hours as needed for wheezing or shortness of breath. 6.7 g 5   ALPRAZolam (XANAX) 0.5 MG tablet TAKE 1 TABLET BY MOUTH TWICE DAILY AS NEEDED FOR ANXIETY FOR 30 DAYS      amitriptyline  (ELAVIL ) 50 MG tablet Take 75 mg by mouth at bedtime. 75 mg     amphetamine-dextroamphetamine (ADDERALL XR) 20 MG 24 hr capsule Take 1 capsule by mouth every morning. 30 mg (2 15mg  pills from shortage)     asciminib hcl (SCEMBLIX) 40 MG tablet Take by mouth.     budesonide -formoterol  (SYMBICORT ) 160-4.5 MCG/ACT inhaler Inhale 2 puffs into the lungs 2 (two) times daily as needed. 10.2 g 8   clobetasol  ointment (TEMOVATE ) 0.05 % Apply 1 Application topically 2 (two) times daily. Place in a thin layer of the affected areas twice a day for 2 weeks.  Then place on the affected areas twice a week at bedtime. 60 g 0   cyclobenzaprine (FLEXERIL) 10 MG tablet Take 10 mg by mouth.     dasatinib  (SPRYCEL ) 50 MG tablet Take 50 mg by mouth.     diazepam  (VALIUM ) 5 MG tablet Place 1 tablet vaginally nightly as needed for muscle spasm/ pelvic pain. 30 tablet 1   diclofenac Sodium (VOLTAREN) 1 % GEL as needed.     fluticasone (FLONASE) 50 MCG/ACT nasal spray 1 spray into each nostril daily.     hydrOXYzine  (ATARAX ) 50 MG tablet TAKE 1 TABLET BY MOUTH EVERYDAY AT BEDTIME 90 tablet 2   ibuprofen  (ADVIL ) 200 MG tablet Take by mouth as needed.     lidocaine  (XYLOCAINE )  2 % solution      loperamide (IMODIUM) 2 MG capsule Take 1 capsule (2 mg) total by mouth once daily as needed for diarrhea.     loratadine (CLARITIN) 10 MG tablet Take by mouth.     montelukast  (SINGULAIR ) 10 MG tablet Take 1 tablet (10 mg total) by mouth daily. Annual appt due in Sept must see provider for future refills 90 tablet 0   norethindrone  (ORTHO MICRONOR ) 0.35 MG tablet Take 1 tablet (0.35 mg total) by mouth daily. 28 tablet 11   norethindrone -ethinyl estradiol -iron (LOESTRIN FE) 1.5-30 MG-MCG tablet Take 1 tablet by mouth daily.     nystatin  (MYCOSTATIN /NYSTOP ) powder Apply 1 Application topically 3 (three) times daily. Apply to affected area for up to 7 days 15 g 2   nystatin  ointment (MYCOSTATIN ) Apply 1 Application  topically 2 (two) times daily. Apply to affected area for up to 7 days. 30 g 1   ondansetron  (ZOFRAN ) 4 MG tablet Take 1 tablet by mouth up to two times a day as needed for nausea. Second choice.     phenazopyridine  (PYRIDIUM ) 95 MG tablet Take 1 tablet (95 mg total) by mouth 3 (three) times daily as needed for pain. 30 tablet 1   prochlorperazine  (COMPAZINE ) 10 MG tablet Take by mouth.     SUMAtriptan  (IMITREX ) 100 MG tablet Take 1 tablet (100 mg total) by mouth every 2 (two) hours as needed for migraine. May repeat in 2 hours if headache persists or recurs. 10 tablet 6   WEGOVY 0.25 MG/0.5ML SOAJ Inject 0.25 mg into the skin.     zonisamide (ZONEGRAN) 25 MG capsule      No current facility-administered medications on file prior to visit.    Review of Systems  Constitutional:  Positive for fatigue and fever.  HENT:  Positive for sore throat, trouble swallowing and voice change. Negative for rhinorrhea.        Bleeding gums - new  Respiratory:  Positive for cough (dry cough - occ gets phlegm out - was bloody but not as much). Negative for shortness of breath and wheezing.   Cardiovascular:  Positive for chest pain. Negative for palpitations.  Gastrointestinal:  Positive for diarrhea (started last night).       Mucus in stool  Genitourinary:  Positive for difficulty urinating, frequency and vaginal discharge.  Musculoskeletal:  Positive for arthralgias, back pain and myalgias.  Skin:  Positive for rash.  Neurological:  Positive for headaches.  Hematological:  Positive for adenopathy.  Psychiatric/Behavioral:  Positive for confusion.        Objective:   Vitals:   01/02/24 1611  BP: 118/72  Pulse: 82  Temp: 99.1 F (37.3 C)  SpO2: 95%   BP Readings from Last 3 Encounters:  01/02/24 118/72  12/21/23 (!) 147/103  12/14/23 104/70   Wt Readings from Last 3 Encounters:  01/02/24 245 lb (111.1 kg)  12/14/23 243 lb (110.2 kg)  09/16/23 251 lb (113.9 kg)   Body mass index is  38.37 kg/m.    Physical Exam Constitutional:      General: She is not in acute distress.    Appearance: Normal appearance. She is not ill-appearing.  HENT:     Head: Normocephalic and atraumatic.     Right Ear: Ear canal and external ear normal. There is no impacted cerumen.     Left Ear: Ear canal and external ear normal. There is no impacted cerumen.     Ears:     Comments:  Possible mild fluid in ears-no erythema    Mouth/Throat:     Mouth: Mucous membranes are moist.     Pharynx: No oropharyngeal exudate or posterior oropharyngeal erythema.  Eyes:     Conjunctiva/sclera: Conjunctivae normal.  Cardiovascular:     Rate and Rhythm: Normal rate and regular rhythm.     Heart sounds: Normal heart sounds.  Pulmonary:     Effort: Pulmonary effort is normal. No respiratory distress.     Breath sounds: Normal breath sounds. No wheezing or rales.  Musculoskeletal:        General: Tenderness (Diffuse in multiple places) present.     Cervical back: Neck supple. No tenderness.     Right lower leg: No edema.     Left lower leg: No edema.  Lymphadenopathy:     Cervical: Cervical adenopathy (Bilateral) present.  Skin:    General: Skin is warm and dry.     Findings: No rash.  Neurological:     Mental Status: She is alert. Mental status is at baseline.  Psychiatric:        Mood and Affect: Mood normal.        Behavior: Behavior normal.            Assessment & Plan:    See Problem List for Assessment and Plan of chronic medical problems.

## 2024-01-02 NOTE — Patient Instructions (Addendum)
      Blood work was ordered.   Have that done tomorrow.     Medications changes include :   None    A referral was ordered [facility] and someone will call you to schedule an appointment.

## 2024-01-02 NOTE — Assessment & Plan Note (Signed)
 Chronic Has seen ENT in the past and has been told that that is reactive and nothing concerning Has intermittent flares where it is worse than other times Would like to seek a second opinion Referral ordered for ENT for this, ear fullness

## 2024-01-03 DIAGNOSIS — M791 Myalgia, unspecified site: Secondary | ICD-10-CM | POA: Diagnosis not present

## 2024-01-03 DIAGNOSIS — M2550 Pain in unspecified joint: Secondary | ICD-10-CM | POA: Diagnosis not present

## 2024-01-03 DIAGNOSIS — R350 Frequency of micturition: Secondary | ICD-10-CM | POA: Diagnosis not present

## 2024-01-03 LAB — CBC WITH DIFFERENTIAL/PLATELET
Basophils Absolute: 0.1 10*3/uL (ref 0.0–0.1)
Basophils Relative: 0.5 % (ref 0.0–3.0)
Eosinophils Absolute: 0.2 10*3/uL (ref 0.0–0.7)
Eosinophils Relative: 1.5 % (ref 0.0–5.0)
HCT: 34.6 % — ABNORMAL LOW (ref 36.0–46.0)
Hemoglobin: 11 g/dL — ABNORMAL LOW (ref 12.0–15.0)
Lymphocytes Relative: 20.2 % (ref 12.0–46.0)
Lymphs Abs: 2.4 10*3/uL (ref 0.7–4.0)
MCHC: 31.9 g/dL (ref 30.0–36.0)
MCV: 82 fl (ref 78.0–100.0)
Monocytes Absolute: 1 10*3/uL (ref 0.1–1.0)
Monocytes Relative: 8.6 % (ref 3.0–12.0)
Neutro Abs: 8.2 10*3/uL — ABNORMAL HIGH (ref 1.4–7.7)
Neutrophils Relative %: 69.2 % (ref 43.0–77.0)
Platelets: 329 10*3/uL (ref 150.0–400.0)
RBC: 4.22 Mil/uL (ref 3.87–5.11)
RDW: 15.2 % (ref 11.5–15.5)
WBC: 11.9 10*3/uL — ABNORMAL HIGH (ref 4.0–10.5)

## 2024-01-03 LAB — COMPREHENSIVE METABOLIC PANEL WITH GFR
ALT: 22 U/L (ref 0–35)
AST: 19 U/L (ref 0–37)
Albumin: 4 g/dL (ref 3.5–5.2)
Alkaline Phosphatase: 75 U/L (ref 39–117)
BUN: 9 mg/dL (ref 6–23)
CO2: 30 meq/L (ref 19–32)
Calcium: 8.8 mg/dL (ref 8.4–10.5)
Chloride: 101 meq/L (ref 96–112)
Creatinine, Ser: 0.78 mg/dL (ref 0.40–1.20)
GFR: 103.51 mL/min (ref >60.00)
Glucose, Bld: 82 mg/dL (ref 70–99)
Potassium: 3.8 meq/L (ref 3.5–5.1)
Sodium: 136 meq/L (ref 135–145)
Total Bilirubin: 0.3 mg/dL (ref 0.2–1.2)
Total Protein: 7 g/dL (ref 6.0–8.3)

## 2024-01-03 LAB — URINALYSIS, ROUTINE W REFLEX MICROSCOPIC
Bilirubin Urine: NEGATIVE
Hgb urine dipstick: NEGATIVE
Ketones, ur: NEGATIVE
Leukocytes,Ua: NEGATIVE
Nitrite: NEGATIVE
RBC / HPF: NONE SEEN (ref 0–?)
Specific Gravity, Urine: 1.01 (ref 1.000–1.030)
Total Protein, Urine: NEGATIVE
Urine Glucose: NEGATIVE
Urobilinogen, UA: 0.2 (ref 0.0–1.0)
pH: 7 (ref 5.0–8.0)

## 2024-01-03 LAB — AMYLASE: Amylase: 32 U/L (ref 27–131)

## 2024-01-03 LAB — SEDIMENTATION RATE: Sed Rate: 33 mm/h — ABNORMAL HIGH (ref 0–20)

## 2024-01-03 LAB — C-REACTIVE PROTEIN: CRP: 1.6 mg/dL (ref 0.5–20.0)

## 2024-01-03 LAB — TSH: TSH: 1.56 u[IU]/mL (ref 0.35–5.50)

## 2024-01-03 LAB — LIPASE: Lipase: 26 U/L (ref 11.0–59.0)

## 2024-01-05 ENCOUNTER — Other Ambulatory Visit: Payer: Self-pay | Admitting: Hematology & Oncology

## 2024-01-05 DIAGNOSIS — C9210 Chronic myeloid leukemia, BCR/ABL-positive, not having achieved remission: Secondary | ICD-10-CM

## 2024-01-05 DIAGNOSIS — R591 Generalized enlarged lymph nodes: Secondary | ICD-10-CM

## 2024-01-05 DIAGNOSIS — R52 Pain, unspecified: Secondary | ICD-10-CM | POA: Diagnosis not present

## 2024-01-05 DIAGNOSIS — G8929 Other chronic pain: Secondary | ICD-10-CM | POA: Diagnosis not present

## 2024-01-06 DIAGNOSIS — K5900 Constipation, unspecified: Secondary | ICD-10-CM | POA: Diagnosis not present

## 2024-01-06 DIAGNOSIS — C9210 Chronic myeloid leukemia, BCR/ABL-positive, not having achieved remission: Secondary | ICD-10-CM | POA: Diagnosis not present

## 2024-01-06 DIAGNOSIS — R918 Other nonspecific abnormal finding of lung field: Secondary | ICD-10-CM | POA: Diagnosis not present

## 2024-01-06 DIAGNOSIS — R590 Localized enlarged lymph nodes: Secondary | ICD-10-CM | POA: Diagnosis not present

## 2024-01-06 LAB — URINE CULTURE

## 2024-01-06 LAB — RHEUMATOID FACTOR: Rheumatoid fact SerPl-aCnc: <10 [IU]/mL (ref <14)

## 2024-01-06 LAB — CYCLIC CITRUL PEPTIDE ANTIBODY, IGG: Cyclic Citrullin Peptide Ab: <16 U

## 2024-01-06 LAB — ANA: Anti Nuclear Antibody (ANA): NEGATIVE

## 2024-01-07 ENCOUNTER — Ambulatory Visit: Payer: Self-pay | Admitting: Internal Medicine

## 2024-01-16 DIAGNOSIS — Z7969 Long term (current) use of other immunomodulators and immunosuppressants: Secondary | ICD-10-CM | POA: Diagnosis not present

## 2024-01-16 DIAGNOSIS — C9210 Chronic myeloid leukemia, BCR/ABL-positive, not having achieved remission: Secondary | ICD-10-CM | POA: Diagnosis not present

## 2024-01-16 DIAGNOSIS — R1110 Vomiting, unspecified: Secondary | ICD-10-CM | POA: Diagnosis not present

## 2024-01-16 DIAGNOSIS — R1011 Right upper quadrant pain: Secondary | ICD-10-CM | POA: Diagnosis not present

## 2024-01-16 DIAGNOSIS — R531 Weakness: Secondary | ICD-10-CM | POA: Diagnosis not present

## 2024-01-16 DIAGNOSIS — R112 Nausea with vomiting, unspecified: Secondary | ICD-10-CM | POA: Diagnosis not present

## 2024-01-16 DIAGNOSIS — R0789 Other chest pain: Secondary | ICD-10-CM | POA: Diagnosis not present

## 2024-01-16 DIAGNOSIS — R1012 Left upper quadrant pain: Secondary | ICD-10-CM | POA: Diagnosis not present

## 2024-01-16 DIAGNOSIS — K760 Fatty (change of) liver, not elsewhere classified: Secondary | ICD-10-CM | POA: Diagnosis not present

## 2024-01-17 DIAGNOSIS — F4323 Adjustment disorder with mixed anxiety and depressed mood: Secondary | ICD-10-CM | POA: Diagnosis not present

## 2024-01-18 DIAGNOSIS — R531 Weakness: Secondary | ICD-10-CM | POA: Diagnosis not present

## 2024-01-24 DIAGNOSIS — R1310 Dysphagia, unspecified: Secondary | ICD-10-CM | POA: Diagnosis not present

## 2024-01-27 ENCOUNTER — Encounter (INDEPENDENT_AMBULATORY_CARE_PROVIDER_SITE_OTHER): Payer: Self-pay

## 2024-01-30 ENCOUNTER — Ambulatory Visit: Admitting: Family Medicine

## 2024-01-30 ENCOUNTER — Encounter: Payer: Self-pay | Admitting: Family Medicine

## 2024-01-30 ENCOUNTER — Ambulatory Visit: Payer: Self-pay

## 2024-01-30 VITALS — BP 120/82 | HR 79 | Temp 98.7°F | Ht 67.0 in | Wt 241.0 lb

## 2024-01-30 DIAGNOSIS — H60392 Other infective otitis externa, left ear: Secondary | ICD-10-CM

## 2024-01-30 DIAGNOSIS — C9210 Chronic myeloid leukemia, BCR/ABL-positive, not having achieved remission: Secondary | ICD-10-CM

## 2024-01-30 DIAGNOSIS — H65115 Acute and subacute allergic otitis media (mucoid) (sanguinous) (serous), recurrent, left ear: Secondary | ICD-10-CM

## 2024-01-30 DIAGNOSIS — R590 Localized enlarged lymph nodes: Secondary | ICD-10-CM | POA: Diagnosis not present

## 2024-01-30 DIAGNOSIS — H6090 Unspecified otitis externa, unspecified ear: Secondary | ICD-10-CM | POA: Insufficient documentation

## 2024-01-30 MED ORDER — CEFTRIAXONE SODIUM 500 MG IJ SOLR
500.0000 mg | Freq: Once | INTRAMUSCULAR | Status: AC
Start: 2024-01-30 — End: 2024-01-30
  Administered 2024-01-30: 500 mg via INTRAMUSCULAR

## 2024-01-30 MED ORDER — CIPROFLOXACIN-DEXAMETHASONE 0.3-0.1 % OT SUSP: 4.0000 [drp] | Freq: Two times a day (BID) | OTIC | 0 refills | Status: DC

## 2024-01-30 NOTE — Progress Notes (Signed)
 Acute Office Visit  Subjective:     Patient ID: Desiree Carlson, female    DOB: 04/04/1996, 28 y.o.   MRN: [id number]  No chief complaint on file.   HPI Patient is in today for evaluation of left ear pain. Has been seen for this twice and treated with antibiotics and then separately treated with steroids. She has completed all of the above. States that her hearing has been impaired of the left ear for around a week.  States that the left ear is tender to touch, feels itchy, swollen. States lymph nodes on the left side are swollen all the way down her neck.  Has been having headaches. States she chronically has issues with her left ear but this has been much worse over the last week.  Has complex medical history that includes CML, chronic pain syndrome, among others. Was seen at hematology on 12/20/2023 and was treated with Augmentin  875-125 mg twice daily for 10 days for ear infection and fever. Hematology note reviewed by me.  She was seen again in this office on 01/02/2024 and was treated with prednisone .  Would like a second opinion on chronic left ear pain and infections.  ROS Per HPI      Objective:    BP 120/82 (BP Location: Left Arm, Patient Position: Sitting)   Pulse 79   Temp 98.7 F (37.1 C) (Temporal)   Ht 5' 7 (1.702 m)   Wt 241 lb (109.3 kg)   SpO2 92%   BMI 37.75 kg/m    Physical Exam Vitals and nursing note reviewed.  Constitutional:      General: She is not in acute distress.    Appearance: She is ill-appearing.     Comments: Appears fatigued  HENT:     Head: Normocephalic and atraumatic.     Right Ear: Hearing, tympanic membrane, ear canal and external ear normal. No decreased hearing noted. No swelling or tenderness. No middle ear effusion. Tympanic membrane is not erythematous or bulging.     Left Ear: Decreased hearing noted. Swelling and tenderness present. A middle ear effusion is present. Tympanic membrane is erythematous and bulging.      Nose: No congestion.     Mouth/Throat:     Mouth: Mucous membranes are moist.     Pharynx: Oropharynx is clear. No oropharyngeal exudate or posterior oropharyngeal erythema.  Eyes:     Extraocular Movements: Extraocular movements intact.  Cardiovascular:     Rate and Rhythm: Normal rate and regular rhythm.     Heart sounds: Normal heart sounds.  Pulmonary:     Effort: Pulmonary effort is normal. No respiratory distress.     Breath sounds: No wheezing, rhonchi or rales.  Musculoskeletal:     Cervical back: Normal range of motion and neck supple.  Lymphadenopathy:     Cervical: Cervical adenopathy (Left greater than right) present.  Skin:    General: Skin is warm and dry.  Neurological:     General: No focal deficit present.     Mental Status: She is alert and oriented to person, place, and time.   On a  No results found for any visits on 01/30/24.      Assessment & Plan:   Recurrent subacute allergic otitis media of left ear Assessment & Plan: 500 mg Rocephin  injection today Tolerated well Referral to Dr. Camellia Milliner Follow-up tomorrow for reevaluation of the left ear and possible second Rocephin  injection  Orders: -     cefTRIAXone  Sodium -  Ambulatory referral to ENT  Other infective acute otitis externa of left ear Assessment & Plan: Ciprodex  drops to pharmacy Referral to Dr. Camellia Milliner with ENT with [facility]  Orders: -     Ciprofloxacin -dexAMETHasone ; Place 4 drops into the left ear 2 (two) times daily for 7 days.  Dispense: 7.5 mL; Refill: 0 -     cefTRIAXone  Sodium -     Ambulatory referral to ENT  Lymphadenopathy, cervical Assessment & Plan: Rocephin  injection Likely related to left OM.  Orders: -     Ambulatory referral to ENT  CML (chronic myeloid leukemia) (HCC) Assessment & Plan: Likely contributing to recurrent chronic infections Follow-up with oncology as scheduled Continue current medication regimen  Orders: -     Ambulatory referral to  ENT     Meds ordered this encounter  Medications   ciprofloxacin -dexamethasone  (CIPRODEX ) OTIC suspension    Sig: Place 4 drops into the left ear 2 (two) times daily for 7 days.    Dispense:  7.5 mL    Refill:  0   cefTRIAXone  (ROCEPHIN ) injection 500 mg    Return for Follow-up tomorrow for second Rocephin  injection.  Corean LITTIE Ku, FNP

## 2024-01-30 NOTE — Telephone Encounter (Signed)
 FYI Only or Action Required?: FYI only for provider.  Patient was last seen in primary care on 01/02/2024 by Geofm Glade PARAS, MD. Called Nurse Triage reporting Ear Pain, Facial Pain, and Facial Swelling. Symptoms began several days ago. Interventions attempted: Prescription medications: Augmentin , prednisone . Symptoms are: left ear congestion and itching, left facial swelling/puffiness, hoarse voice, neck lymph node swellinggradually worsening.  Triage Disposition: See Physician Within 24 Hours  Patient/caregiver understands and will follow disposition?: Yes                        Copied from CRM [id number]. Topic: Clinical - Red Word Triage >> Jan 30, 2024  9:55 AM Mesmerise C wrote: [facility] that prompted transfer to Nurse Triage: Pain on left side of face and ear pain to the touch on her ear and jawline along with swollen lymph nodes Reason for Disposition  Face swelling is painful to touch  Answer Assessment - Initial Assessment Questions Patient states she felt unwell a month ago and has been on Augmentin  and prednisone  over the past month. She states she wants to be checked for infections. She states she is wanted a second opinion for an ENT. Offered patient acute visits with her PCP tomorrow, patient states she would prefer to be seen today due to not getting much sleep from symptoms. Patient scheduled with NP Corean.  1. ONSET: When did the swelling start? (e.g., minutes, hours, days)     Thursday.  2. LOCATION: What part of the face is swollen?     Left side of face from ear to jaw line.  3. SEVERITY: How swollen is it?     Puffy on left side, she states she can feel the difference.  4. ITCHING: Is there any itching? If Yes, ask: How much?   (Scale 1-10; mild, moderate or severe)     No.  5. PAIN: Is the swelling painful to touch? If Yes, ask: How painful is it?   (Scale 1-10; mild, moderate or severe)   - NONE (0): no pain   - MILD (1-3):  doesn't interfere with normal activities    - MODERATE (4-7): interferes with normal activities or awakens from sleep    - SEVERE (8-10): excruciating pain, unable to do any normal activities      Tender to touch, moderate.  6. FEVER: Do you have a fever? If Yes, ask: What is it, how was it measured, and when did it start?      Yes, 100 is the highest and fevers started Thursday.  7. CAUSE: What do you think is causing the face swelling?     She states she thinks it is either an ear or sinus infection.  8. RECURRENT SYMPTOM: Have you had face swelling before? If Yes, ask: When was the last time? What happened that time?     She states not recently and this is the worst it has been.   9. OTHER SYMPTOMS: Do you have any other symptoms? (e.g., toothache, leg swelling)     Left ear itchy, left ear congestion, headache, swollen lymph nodes in neck, hoarse voice,  10. PREGNANCY: Is there any chance you are pregnant? When was your last menstrual period?       LMP: 1 week ago.  Protocols used: Face Swelling-A-AH

## 2024-01-31 ENCOUNTER — Encounter: Payer: Self-pay | Admitting: Family Medicine

## 2024-01-31 ENCOUNTER — Ambulatory Visit (INDEPENDENT_AMBULATORY_CARE_PROVIDER_SITE_OTHER): Admitting: Family Medicine

## 2024-01-31 VITALS — BP 114/80 | HR 81 | Temp 98.5°F | Ht 67.0 in | Wt 241.0 lb

## 2024-01-31 DIAGNOSIS — E66812 Obesity, class 2: Secondary | ICD-10-CM | POA: Insufficient documentation

## 2024-01-31 DIAGNOSIS — H66005 Acute suppurative otitis media without spontaneous rupture of ear drum, recurrent, left ear: Secondary | ICD-10-CM | POA: Insufficient documentation

## 2024-01-31 DIAGNOSIS — M7918 Myalgia, other site: Secondary | ICD-10-CM | POA: Diagnosis not present

## 2024-01-31 DIAGNOSIS — M2550 Pain in unspecified joint: Secondary | ICD-10-CM | POA: Diagnosis not present

## 2024-01-31 DIAGNOSIS — H60392 Other infective otitis externa, left ear: Secondary | ICD-10-CM | POA: Diagnosis not present

## 2024-01-31 DIAGNOSIS — R591 Generalized enlarged lymph nodes: Secondary | ICD-10-CM | POA: Insufficient documentation

## 2024-01-31 DIAGNOSIS — L509 Urticaria, unspecified: Secondary | ICD-10-CM | POA: Diagnosis not present

## 2024-01-31 DIAGNOSIS — C9210 Chronic myeloid leukemia, BCR/ABL-positive, not having achieved remission: Secondary | ICD-10-CM | POA: Diagnosis not present

## 2024-01-31 DIAGNOSIS — M797 Fibromyalgia: Secondary | ICD-10-CM | POA: Diagnosis not present

## 2024-01-31 MED ORDER — CEFTRIAXONE SODIUM 500 MG IJ SOLR
500.0000 mg | Freq: Once | INTRAMUSCULAR | Status: AC
  Administered 2024-01-31: 500 mg via INTRAMUSCULAR

## 2024-01-31 MED ORDER — CEFTRIAXONE SODIUM 500 MG IJ SOLR
500.0000 mg | Freq: Once | INTRAMUSCULAR | Status: AC
Start: 2024-02-01 — End: 2024-02-01
  Administered 2024-02-01: 500 mg via INTRAMUSCULAR

## 2024-01-31 NOTE — Progress Notes (Signed)
 Acute Office Visit  Subjective:     Patient ID: Desiree Carlson, female    DOB: [birth year], 28 y.o.   MRN: [id number]  Chief Complaint  Patient presents with   Ear Pain    Ear recheck    HPI Patient is in today for reevaluation of left ear.  Was seen in this office by me yesterday and given 500 mg IM Rocephin for left otitis media and left otitis externa. Reports that she is feeling a bit better. States that the ear swelling has decreased.  Denies fever, abd pain, chills, nausea, vomiting, diarrhea, rash, other symptoms.  ROS Per HPI      Objective:    BP 114/80 (BP Location: Left Arm, Patient Position: Sitting)   Pulse 81   Temp 98.5 F (36.9 C) (Temporal)   Ht 5' 7 (1.702 m)   Wt 241 lb (109.3 kg)   SpO2 98%   BMI 37.75 kg/m    Physical Exam Vitals and nursing note reviewed.  Constitutional:      General: She is not in acute distress.    Appearance: Normal appearance. She is obese.     Comments: Appears fatigued  HENT:     Head: Normocephalic and atraumatic.     Right Ear: External ear normal. Swelling and tenderness present. A middle ear effusion is present. Tympanic membrane is erythematous and bulging.     Left Ear: External ear normal.  No middle ear effusion. Tympanic membrane is not erythematous or bulging.     Ears:     Comments: Swelling and tenderness still present, but somewhat improved from yesterday    Nose: Nose normal.     Mouth/Throat:     Mouth: Mucous membranes are moist.     Pharynx: Oropharynx is clear.   Eyes:     Extraocular Movements: Extraocular movements intact.     Pupils: Pupils are equal, round, and reactive to light.    Cardiovascular:     Rate and Rhythm: Normal rate and regular rhythm.  Pulmonary:     Effort: Pulmonary effort is normal.   Musculoskeletal:        General: Normal range of motion.     Cervical back: Normal range of motion.     Right lower leg: No edema.     Left lower leg: No edema.  Lymphadenopathy:      Cervical: No cervical adenopathy.   Neurological:     General: No focal deficit present.     Mental Status: She is alert and oriented to person, place, and time.   Psychiatric:        Mood and Affect: Mood normal.        Thought Content: Thought content normal.    No results found for any visits on 01/31/24.      Assessment & Plan:   Recurrent acute suppurative otitis media without spontaneous rupture of left tympanic membrane -     cefTRIAXone Sodium -     cefTRIAXone Sodium  Other infective acute otitis externa of left ear -     cefTRIAXone Sodium -     cefTRIAXone Sodium  Lymphadenopathy -     cefTRIAXone Sodium -     cefTRIAXone Sodium  Continue ciprodex drops  Has upset stomach with cefdinir, but tolerated rocephin well yesterday Meds ordered this encounter  Medications   cefTRIAXone (ROCEPHIN) injection 500 mg   cefTRIAXone (ROCEPHIN) injection 500 mg    Return for Tomorrow for Rocephin 500mg  injection.  Corean LITTIE Ku, FNP

## 2024-01-31 NOTE — Patient Instructions (Signed)
 You have received an antibiotic injection in the office today.  Continue the Ciprodex drops.   May come in tomorrow for nurse visit for the 3rd rocephin injection.   Follow-up with me for new or worsening symptoms.

## 2024-02-01 ENCOUNTER — Ambulatory Visit

## 2024-02-01 DIAGNOSIS — H60392 Other infective otitis externa, left ear: Secondary | ICD-10-CM | POA: Diagnosis not present

## 2024-02-01 DIAGNOSIS — H66005 Acute suppurative otitis media without spontaneous rupture of ear drum, recurrent, left ear: Secondary | ICD-10-CM

## 2024-02-01 DIAGNOSIS — R591 Generalized enlarged lymph nodes: Secondary | ICD-10-CM

## 2024-02-01 MED ORDER — CEFTRIAXONE SODIUM 500 MG IJ SOLR: 500.0000 mg | Freq: Once | INTRAMUSCULAR | Status: DC

## 2024-02-01 NOTE — Progress Notes (Signed)
 Patient presents in office today for 3rd and final Rocephin injection. Tolerated injection well, does mention her L ear is now kind of starting to bother her. Will double check and be sure ENT referral was placed for patient. No other concerns at this time.

## 2024-02-05 ENCOUNTER — Encounter: Payer: Self-pay | Admitting: Family Medicine

## 2024-02-05 NOTE — Assessment & Plan Note (Signed)
 Ciprodex  drops to pharmacy Referral to Dr. Camellia Milliner with ENT with [facility]

## 2024-02-05 NOTE — Assessment & Plan Note (Signed)
 Likely contributing to recurrent chronic infections Follow-up with oncology as scheduled Continue current medication regimen

## 2024-02-05 NOTE — Assessment & Plan Note (Signed)
 Rocephin  injection Likely related to left OM.

## 2024-02-05 NOTE — Assessment & Plan Note (Addendum)
 500 mg Rocephin  injection today Tolerated well Referral to Dr. Camellia Milliner Follow-up tomorrow for reevaluation of the left ear and possible second Rocephin  injection

## 2024-02-06 DIAGNOSIS — M797 Fibromyalgia: Secondary | ICD-10-CM | POA: Diagnosis not present

## 2024-02-06 DIAGNOSIS — F32A Depression, unspecified: Secondary | ICD-10-CM | POA: Diagnosis not present

## 2024-02-06 DIAGNOSIS — G894 Chronic pain syndrome: Secondary | ICD-10-CM | POA: Diagnosis not present

## 2024-02-06 DIAGNOSIS — E669 Obesity, unspecified: Secondary | ICD-10-CM | POA: Diagnosis not present

## 2024-02-06 DIAGNOSIS — Z13220 Encounter for screening for lipoid disorders: Secondary | ICD-10-CM | POA: Diagnosis not present

## 2024-02-06 DIAGNOSIS — F419 Anxiety disorder, unspecified: Secondary | ICD-10-CM | POA: Diagnosis not present

## 2024-02-06 DIAGNOSIS — M7989 Other specified soft tissue disorders: Secondary | ICD-10-CM | POA: Diagnosis not present

## 2024-02-07 DIAGNOSIS — F4329 Adjustment disorder with other symptoms: Secondary | ICD-10-CM | POA: Diagnosis not present
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
## 2024-02-09 ENCOUNTER — Encounter: Payer: Medicaid Other | Admitting: Internal Medicine

## 2024-02-09 ENCOUNTER — Ambulatory Visit: Payer: Self-pay

## 2024-02-09 ENCOUNTER — Ambulatory Visit: Admitting: Emergency Medicine

## 2024-02-09 NOTE — Progress Notes (Signed)
 Hematology and Oncology Follow Up Visit  Patient: Desiree Carlson Date: 02/15/2024 MRN: [id number]  History of Present Illness: Desiree Carlson is a 28 y.o. female with PMHx sign for IBS, anxiety/depression, PTSD, ADD, chronic pain, IBS with constipation, persistent headaches, asthma, urinary frequency, endometriosis s/p surgery following for CML. History is as outlined below.   - 03/2021: Presented with leukocytosis and worsening fatigue. BMBx showed CML-CP, BCR/ABL = 93.931%. Based on clinical and laboratory presentation she had low risk disease.  - 04/14/2021: Started dasatinib  100mg  daily and achieved MMR in 6 months. Achieved MR 4.5.  - 10/2022: Decreased to 70mg  dasatinib  daily due to intolerance mainly chronic fatigue, pain. She has trialed drug holiday before without improvement in symptoms.  - 12/2022: BCR ABL <0.001% - 04/05/2023: BCR ABL 0.000 - 08/16/2023: BCR ABL 0.000; stopped dasatinib  given c/f toxicities (body aches, fatigue)  - 09/07/2023: Started asciminib  - 11/14/23: Stopped asciminib due to intolerance including pain  - 11/26/23: Restarted Dasatinib  at 50 mg daily.   History of Present Illness The patient is a 28 year old female here for follow-up of chronic myeloid leukemia (CML). She is accompanied by her mother. She is on Dasatinib  50 mg daily.   She reports compliance with Dasatinib . Her BCR level returned slightly elevated, which she finds perplexing. She was off of it for 1 or 2 weeks before restarting dasatinib  on 11/26/2023. Repeat lab pending today.   She reports persistent symptoms and distress due to these symptoms and not having answers. She is considering applying to [facility] program for difficult diagnoses but is unsure how to gather all her medical records. Since 12/2023, she has been experiencing recurrent infections, including fevers, body pain, swelling, an active ear infection, and severe headaches. The pain feels bone-related, particularly in her forehead and behind  her ears when they are infected.  She states a lot of these symptoms have predated her diagnosis of CML and started even in high school. She graduated in 2019 and was frequently ill during college. She has seen an infectious disease doctor and was diagnosed with mono in high school. She has tested negative for Lyme disease. She has a positive ANA and cytomegalovirus (CMV) antibodies. She has been exposed to Epstein-Barr virus (EBV) but tested negative for an active infection. She has a high C-reactive protein (CRP) level and elevated white blood cells. Her symptoms at times seem to get better and than worse again. She reports being sick since she had mono in high school. She is very concerned that she has a missed diagnosis. She has been dealing with these symptoms for years and is frustrated by the lack of a definitive diagnosis. We reviewed her symptoms are unlikely related to her CML or another missed cancer diagnosis given the chronicity of her symptoms and lymph nodes without a large change.  She is concerned that her CML treatment might be masking other conditions. She is worried about potential liver impact from the treatment. She continues to experience lymph node swelling including lymph nodes in her neck, groin and axillary region. A month ago, she experienced burning pain and swelling in her armpits, which led to an ER visit. She states she gets repeated CT imaging which do not lead to a diagnosis and she is wondering what other workup can be done. Additionally, she notes swelling and pain in her breast, armpit, and the area beneath it, along with palpable nodes and redness. She is concerned about similar symptoms in her groin. She has a history of  swollen lymph nodes throughout her body, but the current persistence is unusual. She is concerned about the possibility of head and neck cancer. I reviewed if she had a  head and neck cancer in her neck the lymph nodes would likely grow in size more rapidly.  Her recurrent symptoms are affecting her ability to work and live normally. She continues to experience fatigue and severe bone pain. She has been referred to an orthopedic specialist for her spine, hips, sternum, and skull. She has noticed small bumps on her ribs, sternum, and shins. A recent scan showed degenerative changes, which she suspects might be related to her bone joints.  She has been experiencing recurrent sinus issues, including inner and outer ear infections, a stye, green and red nasal discharge, and coughing up blood. She recently had intense chest pain, vomiting, body pains, and fevers. She has a history of recurrent ear infections and throat swelling since high school. Since our last visit she continues to have issues with ear infections, pain in the ears, swelling around the ears and sinus pressure. She has been on a few trials of antibiotics without complete resolution of her symptoms. Despite trying Augmentin  for 2 weeks, prednisone , and eardrops, her symptoms persist. A week ago, she received 3 Rocephin  injections but her condition did not fully improve. She experiences severe pain behind her jaw and inside her ear, which feels like pressure or sharp pain. She has mouth ulcers, red dots, and black dots. She sometimes feels a small dot along her jaw when she is in pain. The right side under her tongue is swollen inside behind her teeth.  She has an appointment with an ENT specialist in 05/2024 for a second opinion.  She is experiencing pelvic and abdominal pain, which she suspects might be due to endometriosis. She has an appointment with her gynecologist. She has noticed unusual discharge, both bloody and clear. She has been experiencing groin pain for 2 months, which she initially thought was muscular. She tried stretching it without success.   She has been developing dermal fibromas, which are not healing. She also has bruises and rashes on her chest and legs. Her freckles have become  darker and raised.   She does feel the flexeril has helped to manage her muscle flare-ups.     ROS: A Complete ROS was performed and negative if not mentioned above.  MEDS: Meds Ordered in [facility]  Medication Sig Dispense Refill  . albuterol  HFA (PROVENTIL  HFA;VENTOLIN  HFA;PROAIR  HFA) 90 mcg/actuation inhaler Inhale 1-2 puffs.    . ALPRAZolam (XANAX) 0.5 mg tablet Take 0.5 mg by mouth 2 (two) times a day as needed.    . budesonide -formoteroL  (SYMBICORT ) 160-4.5 mcg/actuation inhaler Inhale 2 puffs.    . calcium carbonate-vitamin D3 (OYSTER SHELL) 250 mg-3.125 mcg (125 unit) tab per tablet Take 1 tablet by mouth.    . cetirizine (ZyrTEC) 10 mg tablet Take 10 mg by mouth.    . ciprofloxacin -dexAMETHasone  (CIPRODEX ) 0.3-0.1 % otic suspension PLACE 4 DROPS INTO THE LEFT EAR 2 TIMES DAILY FOR 7 DAYS.    . cyanocobalamin  (VITAMIN B12) 100 mcg tablet Take 100 mcg by mouth daily.    . daSATinib  (SPRYCEL ) 50 mg chemo tablet Take 1 tablet (50 mg total) by mouth daily. Take at the same time each day, with or without food.  Swallow whole; do not break, cut, or crush. 30 tablet 11  . dextroamphetamine-amphetamine (ADDERALL XR) 30 mg 24 hr capsule Take 30 mg by mouth every morning.    SABRA  diazePAM  (VALIUM ) 5 mg tablet Insert 5 mg into the vagina nightly as needed.    . gabapentin (NEURONTIN) 100 mg capsule Take 1 capsule (100 mg total) by mouth 2 (two) times a day. 60 capsule 11  . hydrocortisone 1 % cream Apply topically 2 (two) times a day. Apply to rash on hands twice a day (AM/PM) 56 g 0  . montelukast  (SINGULAIR ) 10 mg tablet Take 10 mg by mouth.    . ondansetron  (ZOFRAN ) 4 mg tablet Take 1 tablet by mouth up to two times a day as needed for nausea. Second choice.    . psyllium (METAMUCIL) 3.4 gram packet Take 1 packet by mouth per protocol (see comments).    . semaglutide, weight loss, (Wegovy) 0.5 mg/0.5 mL subcutaneous pen injector Inject 0.5 mL (0.5 mg total) under the skin every 7 days. 2 mL  2  . venlafaxine (EFFEXOR XR) 37.5 mg 24 hr capsule Take 37.5 mg by mouth.    . cyclobenzaprine (FLEXERIL) 10 mg tablet Take 1 tablet (10 mg total) by mouth 3 (three) times a day as needed for muscle spasms. 30 tablet 0  . semaglutide, weight loss, (Wegovy) 0.25 mg/0.5 mL subcutaneous pen injector Inject 0.5 mL (0.25 mg total) under the skin every 7 days. (Patient not taking: Reported on 02/14/2024) 2 mL 2   No current Epic-ordered facility-administered medications on file.    PHYSICAL EXAM: BP (!) 137/92 (BP Location: Right arm, Patient Position: Sitting)   Pulse 69   Temp 97.9 F (36.6 C) (Temporal)   Resp 16   Wt 107 kg (236 lb 9.6 oz)   LMP  (LMP Unknown)   SpO2 96%   BMI 37.06 kg/m   ECOG PS: 0 GENERAL: Well appearing, no acute distress. Able to ambulate on her own without difficulty and get onto the exam table without difficulty.  HEENT: moist mucous membranes. No conjunctival pallor, without icterus, pupils equal and reactive.  LYMPHATIC: Small cervical/auricular LN on R side, no LAD appreciated on Left side. No infra/supraclavicular, axillary LAD appreciated. Small mobile LN <1cm in L groin.  CARDIOVASCULAR: Regular rate and rhythm without rubs or gallops.   Lungs: non-labored, equal and clear breath sounds bilaterally without anywheezes, rhonchi or crackles. ABDOMEN: Nondistended. No tenderness to abd noted today. No organomegaly appreciated today  EXTREMITIES: Showed no clubbing, cyanosis or edema. SKIN: A few small scattered pink bumps on arms. Slight pink discoloration on upper arms bilaterally.   NEUROLOGIC: Nonfocal.   LABS: Recent Results (from the past week)  Uric Acid   Collection Time: 02/14/24  3:37 PM  Result Value Ref Range   Uric Acid 5.1 2.3 - 6.6 mg/dL  Comprehensive Metabolic Panel   Collection Time: 02/14/24  3:37 PM  Result Value Ref Range   Sodium 137 136 - 145 mmol/L   Potassium 3.6 3.4 - 4.5 mmol/L   Chloride 103 98 - 107 mmol/L   CO2 28 21 - 31  mmol/L   Anion Gap 6 6 - 14 mmol/L   Glucose, Random 75 70 - 99 mg/dL   Blood Urea Nitrogen (BUN) 12 7 - 25 mg/dL   Creatinine 9.25 9.39 - 1.20 mg/dL   eGFR >09 >40 fO/fpw/8.26f7   Albumin 4.4 3.5 - 5.7 g/dL   Total Protein 7.3 6.4 - 8.9 g/dL   Bilirubin, Total 0.3 0.3 - 1.0 mg/dL   Alkaline Phosphatase (ALP) 71 34 - 104 U/L   Aspartate Aminotransferase (AST) 19 13 - 39 U/L   Alanine  Aminotransferase (ALT) 26 7 - 52 U/L   Calcium 8.9 8.6 - 10.3 mg/dL   BUN/Creatinine Ratio    Lactate Dehydrogenase (LDH)   Collection Time: 02/14/24  3:37 PM  Result Value Ref Range   Lactate Dehydrogenase (LDH) 167 140 - 271 U/L  CBC with Differential   Collection Time: 02/14/24  3:37 PM  Result Value Ref Range   WBC 8.30 4.40 - 11.00 10*3/uL   RBC 4.39 4.10 - 5.10 10*6/uL   Hemoglobin 12.1 (L) 12.3 - 15.3 g/dL   Hematocrit 63.8 64.0 - 44.6 %   Mean Corpuscular Volume (MCV) 82.3 80.0 - 96.0 fL   Mean Corpuscular Hemoglobin (MCH) 27.7 27.5 - 33.2 pg   Mean Corpuscular Hemoglobin Conc (MCHC) 33.6 33.0 - 37.0 g/dL   Red Cell Distribution Width (RDW) 15.4 12.3 - 17.0 %   Platelet Count (PLT) 289 150 - 450 10*3/uL   Mean Platelet Volume (MPV) 8.1 6.8 - 10.2 fL   Neutrophils % 69 %   Lymphocytes % 19 %   Monocytes % 11 %   Eosinophils % 1 %   Basophils % 1 %   nRBC % 0 %   Neutrophils Absolute 5.70 1.80 - 7.80 10*3/uL   Lymphocytes # 1.50 1.00 - 4.80 10*3/uL   Monocytes # 0.90 (H) 0.00 - 0.80 10*3/uL   Eosinophils # 0.10 0.00 - 0.50 10*3/uL   Basophils # 0.10 0.00 - 0.20 10*3/uL   nRBC Absolute 0.00 <=0.00 10*3/uL   Labs from Care Everywhere and her PCP this week reviewed and stable.    IMAGING: Reviewed in chart.  PET/CT 09/21/23 1.  Similar prominent bilateral cervical chain lymph nodes with uptake less than blood pool, likely related to known CML. 2.  Bilateral external auditory canal FDG uptake which may be infectious/inflammatory     PATHOLOGY: BCR/ABL FISH on 03/16/21 positive  56.33%. 04/14/2021: Dasatinib  100 mg daily BCR-ABL1 PCR 07/02/2021: 1.02% BCR-ABL1 PCR 09/30/2021: 0.021% 11/25/2021: dose reduced dasatinib  to 70mg  daily d/t poor tolerance BCR-ABL1 PCR 12/24/21: 0.005% Dasatinib  held on 03/03/2022 Restarted Dasatinib  70 mg on 03/24/2022  BCR-ABL: 03/24/2022: 0.012% BCR-ABL: 06/17/2022: 0.006% BCR-ABL: 09/16/2021: 0.002% BCR-ABL: 12/16/2022: <0.001% BCR-ABL: 04/05/2023: 0.000 BCR-ABL: 08/16/2023: 0.000 BCR-ABL: 10/14/2023: 0.000 BCR-ABL: 12/20/2023: 0.0088    BONE MARROW, COLLECTED 04/03/2021:  Chronic myeloid leukemia, chronic phase.   IMPRESSION and PLAN: Desiree Carlson is a 28 y.o. 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   #CML-CP - Presented with leukocytosis and worsening fatigue. Dx with CML-CP 03/2021 based on BMBx. BCR/ABL1 93.93%. Based on sokal score patient has low risk disease.  - Began dasatinib  100mg  daily on 04/14/2021 and achieved MMR in 6 months. Most recent BCR-ABL: 12/16/2022: <0.001%. She continues on dasatinib  70mg  daily.  - In 03/2022 trialed a drug holiday without improvement in her symptoms. Discussed decreasing dasatinib  to 50mg  daily if she remains in a deep remission ([place]. Am J Hematol 2022) or switching to another TKI such as nilotinib, bosutinib or asciminib. Review recent results of the ASC4FIRST trial of asciminib vs TKI of choice in newly diagnosed CML-CP. Reviewed efficacy, safety and tolerability. Expressed that her symptoms (chronic fatigue, body aches) could be due to dasatinib  and even though she trialed off dasatinib  in the past we could consider lowering dasatinib  dose to 50mg  or switching to asciminib 80mg  daily as it appears to have a generally better side effect profile  compared to other TKIs with a lower rate of serious adverse events and more manageable side effects  requiring less dose adjustments or treatment interruptions in the study.  - Decision made to switch to asciminib 80mg  daily and patient started 09/07/23. She developed more frequent and more intense pain episodes. We held Asciminib 11/14/23 and restarted Dasatinib  at 50 mg daily ~11/23/23. -She has chronic generalized pain that does pre-date her CML diagnosis and treatment but seems to be worsened by TKIs. Trials off TKIs have not improved her symptoms and switching to asciminib only worsened her symptoms. I worry given her intermittent elevation in BCR ABL off treatment that she will not be able obtain a treatment free remission and need to remain on TKIs. I explained I do not think her symptoms are due to her CML. I recommend she continue to follow up with psychology/psychiatry team and pain team. She is pursing difficult diagnosis at [facility] which is reasonable to ensure no missed diagnosed. I explained I am happy to help her with this.   - Most recent BCR ABL 0.008%, todays pending. Will follow up and make plan based on next level. If persistently positive will need to increase her dasatinib  back to 70mg  daily and consider mutational testing.   #Cervical LAD, mild #Chronic fatigue #Chronic global joint pains  #parathesias  #Diffuse pain and myofascial pain syndrome - Unclear etiology. Has been persistent since high school. She has seen multiple specialists, most recently rheum. Workup negative, and felt patient has fibromyalgia and chronic myofacial pain syndrome. Review of her chart shows she has been evaluated in 2020 and subsequent years for similar symptoms. These diagnoses were also brought up at that time. It seems while she received IV kentamine her symptoms might have improved. She is following with pain medicine and psychiatry. There is also concern that her underlying PTSD and depression are linked to her symptoms, possibly exacerbating this. - Discussed reaching back out to rheum or pain medicine  to discuss other options. Flexeral has helped some with pain.  - I have reviewed with patient that her symptoms are not likely due to [facility] or other undiagnosed cancer. Esepcially given the chronicity and since her CML has more recently  been in remission. I have reviewed in the past her medications could exacerbate her symptoms (fatigue, joint pains, body pains) however when switching to asciminib her symptoms only worsened. Per chart patient trialed off dasatinib  without improvement in the past. I am also worried given her intermittent elevated BCR ABL when off medication she will not be able to obtain a treatment free remission. - Patient is pursuing difficult diagnosis evaluation at [facility] which is reasonable. I explained I am happy to help.  - Had rheum workup, infectious workup which has been negative. Has had multiple imaging scans. PET-CT 08/2023 without c/f malignancy. Did show slightly FDG avidity in external ear canal on R which may be result of her chronic ear infections.  - Repeat CTCAP 01/2024 no acute findings and subcentimeter LAD in axilla and slightly enlarged mesentery LAD largest 1.3cm similar to prior imaging. I do not think additional imaging is needed at this time.  - Patient has been elevated by rheum with negative workup for autoimmune process and felt most c/w with myofascial pain and fibromyalgia.  - Ortho has ordered US  of back mass (thought likely lipoma) and referral to pain anesthesia as well as recommending consult with ob/gyn. Continue PT  - Would consider excisional biopsy of cervical LN if concerning  per ENT. Agree with second opinion given recurrent infections without improvement on abx and recent steroids. I reviewed her neutrophils or normal and her CML does not make her immunosuppressed. Her IgG level >1000.  - EBV negative.   #Recurrent ear infections #Cervical LAD - Has been evaluated by ENT felt biopsy was not needed and undergo surveillance. She has had recurrent  infections for years and most recently treated with augmentin , rocefin and steroids without improvement. She has second opinion with ENT on 05/2024. I will reach out to team to see if she can be seen sooner given most recent issues. I explained this is unlikely due to her CML as she is not immunocompromised with normal ANC.   #Rash, overall improved - She has multiple types of rashes including chronic urticaria, hand dermatitis. Followed by dermatology. Advise to follow up with derm regarding issues today. - Continue topical steroids as needed - Advised fragrance free lotion application 1-2x a day  #Chronic headaches 2/2 IIH and OSA - Following with neurology. Underwent LP with elevated pressures c/w IIH - Encouraged patient to use CPAP therapy however she is intolerant. She is working on getting the mouth guard.    Plan - Labs today: CBC diff, CMP, LDH, Uric acid, BCR ABL  - Reach out to ENT team - Pursue difficult diagnosis evaluation at [facility] - Continue to follow with psychology/psychiatry and pain -05/15/24 labs and visit. Reviewed pending her BCR ABL   Signed by Gerilyn Almarie Hershell Golda, MD 02/15/2024 11:16 AM

## 2024-02-09 NOTE — Telephone Encounter (Signed)
 FYI Only or Action Required?: FYI only for provider.  Patient was last seen in primary care on 01/31/2024 by Alvia Corean CROME, FNP.  Called Nurse Triage reporting Eye Pain.  Symptoms began several days ago.  Interventions attempted: Other: warm compress.  Symptoms are: unchanged.  Triage Disposition: Home Care - pt wishes to be seen.  Patient/caregiver understands and will follow disposition?: No, refuses disposition                   Copied from CRM [id number]. Topic: Clinical - Red Word Triage >> Feb 09, 2024  8:44 AM Desiree Carlson wrote: [facility] that prompted transfer to Nurse Triage: Patient was put on antibiotics for an ear infection and now has developed a painful stye on her right eye. Eye is watery and itchy. Reason for Disposition  Sty on eyelid  Answer Assessment - Initial Assessment Questions 1. LOCATION: Which eye has the sty? Upper or lower eyelid?     Right eye upper waterline 2. SIZE: How big is it? (Note: standard pencil eraser is 6 mm)     small 3. EYELID: Is the eyelid swollen? If Yes, ask: How much?     yes 4. REDNESS: Has the redness spread onto the eyelid?     yes 5. ONSET: When did you notice the sty?     2 days 6. VISION: Do you have blurred vision?      blurry 7. PAIN: Is it painful? If Yes, ask: How bad is the pain?  (Scale 1-10; or mild, moderate, severe)     Mild/moderate 8. CONTACTS: Do you wear contacts?     no 9. OTHER SYMPTOMS: Do you have any other symptoms? (Carlson.g., fever)     Blurry watery itchy, painful  Protocols used: Sty-A-AH

## 2024-02-10 DIAGNOSIS — M7989 Other specified soft tissue disorders: Secondary | ICD-10-CM | POA: Diagnosis not present

## 2024-02-13 DIAGNOSIS — R591 Generalized enlarged lymph nodes: Secondary | ICD-10-CM | POA: Diagnosis not present

## 2024-02-13 DIAGNOSIS — R52 Pain, unspecified: Secondary | ICD-10-CM | POA: Diagnosis not present

## 2024-02-13 DIAGNOSIS — R1310 Dysphagia, unspecified: Secondary | ICD-10-CM | POA: Diagnosis not present

## 2024-02-13 DIAGNOSIS — J385 Laryngeal spasm: Secondary | ICD-10-CM | POA: Diagnosis not present

## 2024-02-13 DIAGNOSIS — C9210 Chronic myeloid leukemia, BCR/ABL-positive, not having achieved remission: Secondary | ICD-10-CM | POA: Diagnosis not present

## 2024-02-13 DIAGNOSIS — R490 Dysphonia: Secondary | ICD-10-CM | POA: Insufficient documentation

## 2024-02-13 DIAGNOSIS — G8929 Other chronic pain: Secondary | ICD-10-CM | POA: Diagnosis not present

## 2024-02-14 DIAGNOSIS — H60391 Other infective otitis externa, right ear: Secondary | ICD-10-CM | POA: Diagnosis not present

## 2024-02-14 DIAGNOSIS — C9210 Chronic myeloid leukemia, BCR/ABL-positive, not having achieved remission: Secondary | ICD-10-CM | POA: Diagnosis not present

## 2024-02-14 DIAGNOSIS — M62838 Other muscle spasm: Secondary | ICD-10-CM | POA: Diagnosis not present

## 2024-02-16 DIAGNOSIS — F419 Anxiety disorder, unspecified: Secondary | ICD-10-CM | POA: Diagnosis not present

## 2024-02-16 DIAGNOSIS — F988 Other specified behavioral and emotional disorders with onset usually occurring in childhood and adolescence: Secondary | ICD-10-CM | POA: Diagnosis not present

## 2024-02-16 DIAGNOSIS — F4310 Post-traumatic stress disorder, unspecified: Secondary | ICD-10-CM | POA: Diagnosis not present

## 2024-02-16 DIAGNOSIS — F339 Major depressive disorder, recurrent, unspecified: Secondary | ICD-10-CM | POA: Diagnosis not present

## 2024-02-16 NOTE — Progress Notes (Signed)
 [facility] Health Care Psychiatry--Comprehensive Cancer Support Program  Established Patient E&M Service - Outpatient    Assessment:  Desiree Carlson presents for follow-up evaluation. She states that she continues to struggle with moderate to severe depression and anxiety. She had no difficulty weaning off of the amitriptyline  and no issues tolerating the switch to venlafaxine. Increasing her venlafaxine ER from 37.5 mg daily to 75 mg daily for 1 week followed by 112.5 mg daily thereafter. Switching from Adderall to Adderall XR and prescribing the 30 mg dose. Will see if her sleep improves with better depression and anxiety management. Continuing her PRN alprazolam 0.5 mg, which she consistently uses responsibly. She continues to engage in psychotherapy locally. Will plan to reassess on 03/15/24.  Identifying Information: Desiree Carlson is a 28 y.o. female with a history of CML, treatment resistant recurrent major depressive disorder, PTSD, anxiety, OSA and attention deficit disorder. She has had multiple anti-depressant trials without significant benefit.   Risk Assessment: An assessment of suicide and violence risk factors was performed as part of this evaluation and is not significantly increased from the last visit.  While future psychiatric events cannot be accurately predicted, the patient does not currently require acute inpatient psychiatric care and does not currently meet [place]  involuntary commitment criteria.    Plan:  Problem 1: Major Depressive Disorder, treatment resistant Status of problem: chronic with moderate to severe exacerbation Interventions:  Increase venlafaxine ER from 37.5 mg to 75 mg for 1 week, followed by 112.5 mg thereafter She did not complete esketamine therapy. Stopped after 11 treatments due to lack of efficacy and financial concerns (losing insurance at end of year). Patient met with pain management and referral is being made for ketamine infusions, which can  help with both pain and depression. 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 States that she is currently using art therapy and may be able to provide EMDR in the future.   Problem 2: PTSD/Anxiety Status of problem: chronic with moderate to severe exacerbation Interventions:  See MDD plan above OK to continue alprazolam 0.5 mg PRN. PDMP reviewed and she continues to use this medication responsibly   Problem 3: Attention Deficit Disorder Status of problem: worsening Interventions:  Switch from Adderall 15 mg BID to Adderall XR 30 mg once daily   Psychotherapy provided: No billable psychotherapy service provided.  Patient has been given this writer's contact information as well as the [facility] Psych[facility] urgent line number. The patient has been instructed to call 911 for emergencies.   Subjective:  Chief complaint:  Follow-up psychiatric evaluation for depression, anxiety, and ADHD  Interval History:  She experiences significant mental health challenges, including feelings of hopelessness and worthlessness, which she attributes to her ongoing health issues. She has been on venlafaxine since January 21, 2024, with some improvement but continues to struggle with depression and anxiety.  She experiences panic attacks and uses alprazolam as needed, though she avoids it due to its sedative effects.  She reports disrupted sleep patterns, often feeling exhausted but unable to sleep, particularly after stressful appointments or when in pain. Racing thoughts and increased pain at night contribute to her insomnia.  She is currently taking Adderall for attention and focus but notes that the immediate release formulation wears off quickly. She would like to return to a long acting formulation of the Adderall.  Prior Psychiatric Medication Trials: bupropion (listed as allergy, skin was burning), vortioxetine (listed as allergy), Lexapro, Effexor, Lamictal, Cymbalta,  Pristiq, Rexulti, Vyvanse, Abilify, Viibryd, Prozac, Trazodone, Lithium, amitriptyline    Psychiatric Review of Systems Depressive Symptoms: anhedonia, depressed mood, difficulty concentrating, feelings of worthlessness/guilt, and feelings of hopelessness Manic Symptoms: N/A  Psychosis Symptoms: N/A Anxiety Symptoms: excessive anxiety and worry, ruminations, easily fatigued, difficulty concentrating, irritability, and racing thoughts at night Panic Symptoms: recurrent, unexpected panic attacks, accelerated heart rate, sweating, shaking, shortness of breath, sensation of smothering, chest pain/discomfort, chills  PTSD Symptoms: Hypervigilence, panic attacks, nightmares, attempts at avoiding triggers  Sleep disturbance: difficulty falling and staying asleep    Objective:   Mental Status Exam: Appearance:    Appears stated age, Well nourished, and Clean/Neat  Motor:   No abnormal movements  Speech/Language:    Normal rate, volume, tone, fluency  Mood:   Depressed  Affect:   Calm, Cooperative, and Depressed  Thought process and Associations:   Logical, linear, clear, coherent, goal directed  Abnormal/psychotic thought content:     Denies SI, HI, self harm, delusions, obsessions, paranoid ideation, or ideas of reference  Perceptual disturbances:     Denies auditory and visual hallucinations, behavior not concerning for response to internal stimuli   Other:   Insight and judgment intact   I personally spent 51 minutes face-to-face and non-face-to-face in the care of this patient, which includes all pre, intra, and post visit time on the date of service.  Visit was completed by video (or phone) and the appropriate disclaimer has been included below.  The patient reports they are physically located in [place]  and is currently: at home. I conducted a audio/video visit. I spent  39m 57s on the video call with the patient. I spent an additional 15 minutes on pre- and post-visit activities  on the date of service .     Melissa B Holt, PMHNP 02/16/2024

## 2024-02-21 NOTE — Progress Notes (Signed)
 Placed referral to physical therapy, lymphedema clinic per patient request. Desiree Carlson

## 2024-02-23 DIAGNOSIS — J385 Laryngeal spasm: Secondary | ICD-10-CM | POA: Insufficient documentation

## 2024-02-23 DIAGNOSIS — G8929 Other chronic pain: Secondary | ICD-10-CM | POA: Diagnosis not present

## 2024-02-23 DIAGNOSIS — R1310 Dysphagia, unspecified: Secondary | ICD-10-CM | POA: Diagnosis not present

## 2024-02-23 DIAGNOSIS — R490 Dysphonia: Secondary | ICD-10-CM | POA: Diagnosis not present

## 2024-02-23 DIAGNOSIS — R591 Generalized enlarged lymph nodes: Secondary | ICD-10-CM | POA: Diagnosis not present

## 2024-02-23 DIAGNOSIS — R52 Pain, unspecified: Secondary | ICD-10-CM | POA: Diagnosis not present

## 2024-02-23 DIAGNOSIS — C9210 Chronic myeloid leukemia, BCR/ABL-positive, not having achieved remission: Secondary | ICD-10-CM | POA: Diagnosis not present

## 2024-02-23 DIAGNOSIS — F4323 Adjustment disorder with mixed anxiety and depressed mood: Secondary | ICD-10-CM | POA: Diagnosis not present

## 2024-02-24 DIAGNOSIS — R1310 Dysphagia, unspecified: Secondary | ICD-10-CM | POA: Diagnosis not present

## 2024-02-24 DIAGNOSIS — J385 Laryngeal spasm: Secondary | ICD-10-CM | POA: Diagnosis not present

## 2024-02-24 DIAGNOSIS — C9210 Chronic myeloid leukemia, BCR/ABL-positive, not having achieved remission: Secondary | ICD-10-CM | POA: Diagnosis not present

## 2024-02-24 DIAGNOSIS — R52 Pain, unspecified: Secondary | ICD-10-CM | POA: Diagnosis not present

## 2024-02-24 DIAGNOSIS — R591 Generalized enlarged lymph nodes: Secondary | ICD-10-CM | POA: Diagnosis not present

## 2024-02-24 DIAGNOSIS — R490 Dysphonia: Secondary | ICD-10-CM | POA: Diagnosis not present

## 2024-02-24 DIAGNOSIS — G8929 Other chronic pain: Secondary | ICD-10-CM | POA: Diagnosis not present

## 2024-02-27 DIAGNOSIS — E669 Obesity, unspecified: Secondary | ICD-10-CM | POA: Diagnosis not present

## 2024-02-27 DIAGNOSIS — Z6836 Body mass index (BMI) 36.0-36.9, adult: Secondary | ICD-10-CM | POA: Diagnosis not present

## 2024-02-27 DIAGNOSIS — Z713 Dietary counseling and surveillance: Secondary | ICD-10-CM | POA: Diagnosis not present

## 2024-03-01 DIAGNOSIS — C9210 Chronic myeloid leukemia, BCR/ABL-positive, not having achieved remission: Secondary | ICD-10-CM | POA: Diagnosis not present

## 2024-03-02 DIAGNOSIS — C9210 Chronic myeloid leukemia, BCR/ABL-positive, not having achieved remission: Secondary | ICD-10-CM | POA: Diagnosis not present

## 2024-03-02 DIAGNOSIS — G894 Chronic pain syndrome: Secondary | ICD-10-CM | POA: Diagnosis not present

## 2024-03-02 DIAGNOSIS — M47816 Spondylosis without myelopathy or radiculopathy, lumbar region: Secondary | ICD-10-CM | POA: Diagnosis not present

## 2024-03-02 DIAGNOSIS — M7918 Myalgia, other site: Secondary | ICD-10-CM | POA: Diagnosis not present

## 2024-03-07 DIAGNOSIS — R220 Localized swelling, mass and lump, head: Secondary | ICD-10-CM | POA: Diagnosis not present

## 2024-03-07 DIAGNOSIS — M62838 Other muscle spasm: Secondary | ICD-10-CM | POA: Diagnosis not present

## 2024-03-07 DIAGNOSIS — M7918 Myalgia, other site: Secondary | ICD-10-CM | POA: Diagnosis not present

## 2024-03-07 DIAGNOSIS — R682 Dry mouth, unspecified: Secondary | ICD-10-CM | POA: Diagnosis not present

## 2024-03-07 DIAGNOSIS — K120 Recurrent oral aphthae: Secondary | ICD-10-CM | POA: Diagnosis not present

## 2024-03-08 DIAGNOSIS — F4323 Adjustment disorder with mixed anxiety and depressed mood: Secondary | ICD-10-CM | POA: Diagnosis not present

## 2024-03-09 DIAGNOSIS — M25552 Pain in left hip: Secondary | ICD-10-CM | POA: Diagnosis not present

## 2024-03-09 DIAGNOSIS — M25551 Pain in right hip: Secondary | ICD-10-CM | POA: Diagnosis not present

## 2024-03-12 DIAGNOSIS — R1310 Dysphagia, unspecified: Secondary | ICD-10-CM | POA: Diagnosis not present

## 2024-03-12 DIAGNOSIS — R52 Pain, unspecified: Secondary | ICD-10-CM | POA: Diagnosis not present

## 2024-03-12 DIAGNOSIS — R490 Dysphonia: Secondary | ICD-10-CM | POA: Diagnosis not present

## 2024-03-12 DIAGNOSIS — G8929 Other chronic pain: Secondary | ICD-10-CM | POA: Diagnosis not present

## 2024-03-12 DIAGNOSIS — C9210 Chronic myeloid leukemia, BCR/ABL-positive, not having achieved remission: Secondary | ICD-10-CM | POA: Diagnosis not present

## 2024-03-12 DIAGNOSIS — J385 Laryngeal spasm: Secondary | ICD-10-CM | POA: Diagnosis not present

## 2024-03-12 DIAGNOSIS — R591 Generalized enlarged lymph nodes: Secondary | ICD-10-CM | POA: Diagnosis not present

## 2024-03-14 ENCOUNTER — Encounter: Payer: Self-pay | Admitting: Obstetrics and Gynecology

## 2024-03-14 ENCOUNTER — Ambulatory Visit: Admitting: Obstetrics and Gynecology

## 2024-03-14 ENCOUNTER — Telehealth: Payer: Self-pay | Admitting: Obstetrics and Gynecology

## 2024-03-14 VITALS — BP 124/80 | HR 79 | Ht 69.25 in | Wt 225.0 lb

## 2024-03-14 DIAGNOSIS — N644 Mastodynia: Secondary | ICD-10-CM

## 2024-03-14 NOTE — Patient Instructions (Signed)
 Breast Tenderness Breast tenderness is a common problem for women of all ages, but may also occur in men. Breast tenderness has many possible causes, including hormone changes, infections, taking certain medicines, and caffeine intake. In women, the pain usually comes and goes with the menstrual cycle, but it can also be constant. Breast tenderness may range from mild discomfort to severe pain. You may have tests, such as a mammogram or an ultrasound, to check for any unusual findings. Having breast tenderness usually does not mean that you have breast cancer. Follow these instructions at home: Managing pain and discomfort  If directed, put ice on the painful area. To do this: Put ice in a plastic bag. Place a towel between your skin and the bag. Leave the ice on for 20 minutes, 2-3 times a day. If your skin turns bright red, remove the ice right away to prevent skin damage. The risk of skin damage is higher if you cannot feel pain, heat, or cold. Wear a supportive bra or chest support: During exercise. While sleeping, if your breasts are very tender. Medicines Take over-the-counter and prescription medicines only as told by your health care provider. If the cause of your pain is an infection, you may be prescribed an antibiotic medicine. If you were prescribed antibiotics, take them as told by your health care provider. Do not stop using the antibiotic even if you start to feel better. Eating and drinking Decrease the amount of caffeine in your diet. Instead, drink more water and choose caffeine-free drinks. Your health care provider may recommend that you lessen the amount of fat in your diet. You can do this by: Limiting fried foods. Cooking foods using methods such as baking, boiling, grilling, and broiling. General instructions  Keep a log of the days and times when your breasts are most tender. Ask your health care provider how to do breast exams at home. This will help you notice if  you have an unusual growth or lump. Keep all follow-up visits. Contact a health care provider if: Any part of your breast is hard, red, and hot to the touch. This may be a sign of infection. You are a woman and have a new or painful lump in your breast that remains after your menstrual period ends. You are not breastfeeding and you have fluid, especially blood or pus, coming out of your nipples. You have a fever. Your pain does not improve or it gets worse. Your pain is interfering with your daily activities. Summary Breast tenderness may range from mild discomfort to severe pain. Breast tenderness has many possible causes, including hormone changes, infections, taking certain medicines, and caffeine intake. It can be treated with ice, wearing a supportive bra or chest support, and medicines. Make changes to your diet as told by your health care provider. This information is not intended to replace advice given to you by your health care provider. Make sure you discuss any questions you have with your health care provider. Document Revised: 09/30/2021 Document Reviewed: 09/30/2021 Elsevier Patient Education  2024 [employer].

## 2024-03-14 NOTE — Telephone Encounter (Signed)
 Please schedule bilateral [facility] my patient who has persistent multifocal bilateral breast pain.  I do not feel a mass on examination.   Patient has a history of chronic myeloid leukemia.
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
## 2024-03-15 DIAGNOSIS — G932 Benign intracranial hypertension: Secondary | ICD-10-CM | POA: Diagnosis not present

## 2024-03-15 DIAGNOSIS — G43719 Chronic migraine without aura, intractable, without status migrainosus: Secondary | ICD-10-CM | POA: Diagnosis not present

## 2024-03-15 NOTE — Telephone Encounter (Signed)
 Spoke with Cathlean at [facility], scheduled for 03/30/24 at 1030.    Call placed to patient. Left detailed message, ok per dpr.  Advised bilateral Dx MMG and bilateral breast US  scheduled for 03/30/24 at 1030. If you need to make any changes to this appointment, contact [facility] directly at [phone]. If any additional questions return call to office at [phone], opt 4.   Routing to provider for final review. Patient is agreeable to disposition. Will close encounter.

## 2024-03-15 NOTE — Telephone Encounter (Signed)
 Patient and I discussed the possibility of doing only bilateral breast US  based on her age.   Please inform her so she is aware.   Thank you.

## 2024-03-15 NOTE — Telephone Encounter (Signed)
 Message received from [place] at [facility]. States she reviewed imaging request with supervisor, patient is 28, unable to schedule Dx MMG, would be bilateral ultrasounds only. Hx of leukemia does not qualify for Dx MMG. Appt has been updated to reflect changes.   Routing to Dr. Nikki to review.

## 2024-03-16 NOTE — Telephone Encounter (Signed)
 Patient notified. Patient verbalizes understanding and is agreeable.   Cathlean at [facility] notified that patient has been updated.   Encounter closed.

## 2024-03-22 DIAGNOSIS — F4323 Adjustment disorder with mixed anxiety and depressed mood: Secondary | ICD-10-CM | POA: Diagnosis not present

## 2024-03-23 DIAGNOSIS — C9210 Chronic myeloid leukemia, BCR/ABL-positive, not having achieved remission: Secondary | ICD-10-CM | POA: Diagnosis not present

## 2024-03-23 DIAGNOSIS — G8929 Other chronic pain: Secondary | ICD-10-CM | POA: Diagnosis not present

## 2024-03-23 DIAGNOSIS — H6063 Unspecified chronic otitis externa, bilateral: Secondary | ICD-10-CM | POA: Diagnosis not present

## 2024-03-23 DIAGNOSIS — R590 Localized enlarged lymph nodes: Secondary | ICD-10-CM | POA: Diagnosis not present

## 2024-03-23 DIAGNOSIS — M7918 Myalgia, other site: Secondary | ICD-10-CM | POA: Diagnosis not present

## 2024-03-23 DIAGNOSIS — M549 Dorsalgia, unspecified: Secondary | ICD-10-CM | POA: Diagnosis not present

## 2024-03-23 DIAGNOSIS — H65115 Acute and subacute allergic otitis media (mucoid) (sanguinous) (serous), recurrent, left ear: Secondary | ICD-10-CM | POA: Diagnosis not present

## 2024-03-27 DIAGNOSIS — R591 Generalized enlarged lymph nodes: Secondary | ICD-10-CM | POA: Diagnosis not present

## 2024-03-27 DIAGNOSIS — R52 Pain, unspecified: Secondary | ICD-10-CM | POA: Diagnosis not present

## 2024-03-27 DIAGNOSIS — R490 Dysphonia: Secondary | ICD-10-CM | POA: Diagnosis not present

## 2024-03-27 DIAGNOSIS — C9210 Chronic myeloid leukemia, BCR/ABL-positive, not having achieved remission: Secondary | ICD-10-CM | POA: Diagnosis not present

## 2024-03-27 DIAGNOSIS — R1310 Dysphagia, unspecified: Secondary | ICD-10-CM | POA: Diagnosis not present

## 2024-03-27 DIAGNOSIS — J385 Laryngeal spasm: Secondary | ICD-10-CM | POA: Diagnosis not present

## 2024-03-27 DIAGNOSIS — G8929 Other chronic pain: Secondary | ICD-10-CM | POA: Diagnosis not present

## 2024-03-28 DIAGNOSIS — C9210 Chronic myeloid leukemia, BCR/ABL-positive, not having achieved remission: Secondary | ICD-10-CM | POA: Diagnosis not present

## 2024-03-28 DIAGNOSIS — R590 Localized enlarged lymph nodes: Secondary | ICD-10-CM | POA: Diagnosis not present

## 2024-03-30 ENCOUNTER — Encounter

## 2024-03-30 ENCOUNTER — Ambulatory Visit
Admission: RE | Admit: 2024-03-30 | Discharge: 2024-03-30 | Disposition: A | Discharge status: Home / Self Care | Source: Ambulatory Visit | Attending: Obstetrics and Gynecology | Admitting: Obstetrics and Gynecology

## 2024-03-30 DIAGNOSIS — R928 Other abnormal and inconclusive findings on diagnostic imaging of breast: Secondary | ICD-10-CM | POA: Diagnosis not present

## 2024-03-30 DIAGNOSIS — N644 Mastodynia: Secondary | ICD-10-CM

## 2024-04-03 DIAGNOSIS — M542 Cervicalgia: Secondary | ICD-10-CM | POA: Diagnosis not present

## 2024-04-06 ENCOUNTER — Ambulatory Visit (HOSPITAL_BASED_OUTPATIENT_CLINIC_OR_DEPARTMENT_OTHER): Payer: Self-pay | Admitting: Obstetrics and Gynecology

## 2024-04-09 DIAGNOSIS — F339 Major depressive disorder, recurrent, unspecified: Secondary | ICD-10-CM | POA: Diagnosis not present

## 2024-04-09 DIAGNOSIS — F988 Other specified behavioral and emotional disorders with onset usually occurring in childhood and adolescence: Secondary | ICD-10-CM | POA: Diagnosis not present

## 2024-04-09 DIAGNOSIS — F4310 Post-traumatic stress disorder, unspecified: Secondary | ICD-10-CM | POA: Diagnosis not present

## 2024-04-09 DIAGNOSIS — F419 Anxiety disorder, unspecified: Secondary | ICD-10-CM | POA: Diagnosis not present

## 2024-04-10 DIAGNOSIS — G43719 Chronic migraine without aura, intractable, without status migrainosus: Secondary | ICD-10-CM | POA: Diagnosis not present

## 2024-04-11 DIAGNOSIS — G8929 Other chronic pain: Secondary | ICD-10-CM | POA: Diagnosis not present

## 2024-04-11 DIAGNOSIS — M549 Dorsalgia, unspecified: Secondary | ICD-10-CM | POA: Diagnosis not present

## 2024-04-11 DIAGNOSIS — M7918 Myalgia, other site: Secondary | ICD-10-CM | POA: Diagnosis not present

## 2024-04-12 DIAGNOSIS — F4323 Adjustment disorder with mixed anxiety and depressed mood: Secondary | ICD-10-CM | POA: Diagnosis not present

## 2024-04-17 DIAGNOSIS — K219 Gastro-esophageal reflux disease without esophagitis: Secondary | ICD-10-CM | POA: Diagnosis not present

## 2024-04-17 DIAGNOSIS — K5090 Crohn's disease, unspecified, without complications: Secondary | ICD-10-CM | POA: Diagnosis not present

## 2024-04-17 DIAGNOSIS — R109 Unspecified abdominal pain: Secondary | ICD-10-CM | POA: Diagnosis not present

## 2024-04-17 DIAGNOSIS — K581 Irritable bowel syndrome with constipation: Secondary | ICD-10-CM | POA: Diagnosis not present

## 2024-04-17 DIAGNOSIS — K5909 Other constipation: Secondary | ICD-10-CM | POA: Diagnosis not present

## 2024-04-17 DIAGNOSIS — M6289 Other specified disorders of muscle: Secondary | ICD-10-CM | POA: Diagnosis not present

## 2024-04-17 DIAGNOSIS — G8929 Other chronic pain: Secondary | ICD-10-CM | POA: Diagnosis not present

## 2024-04-17 DIAGNOSIS — K120 Recurrent oral aphthae: Secondary | ICD-10-CM | POA: Diagnosis not present
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
## 2024-04-24 ENCOUNTER — Encounter: Payer: Self-pay | Admitting: Obstetrics and Gynecology

## 2024-04-24 ENCOUNTER — Ambulatory Visit (INDEPENDENT_AMBULATORY_CARE_PROVIDER_SITE_OTHER): Admitting: Obstetrics and Gynecology

## 2024-04-24 ENCOUNTER — Other Ambulatory Visit (HOSPITAL_COMMUNITY)
Admission: RE | Admit: 2024-04-24 | Discharge: 2024-04-24 | Disposition: A | Discharge status: Home / Self Care | Source: Ambulatory Visit | Attending: Obstetrics and Gynecology | Admitting: Obstetrics and Gynecology

## 2024-04-24 VITALS — BP 108/66 | HR 82 | Ht 69.5 in | Wt 221.0 lb

## 2024-04-24 DIAGNOSIS — Z113 Encounter for screening for infections with a predominantly sexual mode of transmission: Secondary | ICD-10-CM | POA: Diagnosis not present

## 2024-04-24 DIAGNOSIS — Z01419 Encounter for gynecological examination (general) (routine) without abnormal findings: Secondary | ICD-10-CM | POA: Diagnosis not present

## 2024-04-24 DIAGNOSIS — Z1331 Encounter for screening for depression: Secondary | ICD-10-CM

## 2024-04-24 DIAGNOSIS — Z114 Encounter for screening for human immunodeficiency virus [HIV]: Secondary | ICD-10-CM

## 2024-04-24 DIAGNOSIS — Z1159 Encounter for screening for other viral diseases: Secondary | ICD-10-CM | POA: Diagnosis not present

## 2024-04-24 DIAGNOSIS — L900 Lichen sclerosus et atrophicus: Secondary | ICD-10-CM | POA: Diagnosis not present

## 2024-04-24 LAB — CERVICOVAGINAL ANCILLARY ONLY
Chlamydia: NEGATIVE
Comment: NEGATIVE
Comment: NEGATIVE
Comment: NORMAL
Neisseria Gonorrhea: NEGATIVE
Trichomonas: NEGATIVE

## 2024-04-24 MED ORDER — CLOBETASOL PROPIONATE 0.05 % EX OINT: 1.0000 | TOPICAL_OINTMENT | Freq: Two times a day (BID) | CUTANEOUS | 1 refills | Status: AC

## 2024-04-24 NOTE — Patient Instructions (Addendum)
 Helping Someone Who Is Suicidal Suicide is the act of ending, or taking, one's own life. Someone who is thinking about suicide needs help right away. Listen to the person. Even if you do not know what to say or do to help, you can start by letting the person know that you care. Talk to the person about how to get help. Help is available through suicide hotlines and through therapy and other treatments. What are the risk factors for suicide? Risk factors for suicide include: Having a friend or family member who has died by suicide. A history of attempted suicide. Depression or other mental health problems. Being exposed to graphic stories of suicide in the media. Alcohol  or drug misuse, especially when combined with a mental illness. A serious physical problem, such as long-term (chronic) pain. Stressful life events, now or in the past. These may include: Divorce or social rejection. Childhood abuse or neglect. Sudden life changes, such as a financial crisis or going to jail. What are warning signs to watch for? Most people who are thinking about suicide show warning signs. Signs may include: Expressing thoughts about, or a preoccupation with, ending one's own life. Making threats or comments about ending one's own life. Withdrawing from normal activities or avoiding friends, family, coworkers, or classmates. Dramatic mood swings. Impulsive or reckless behavior. An increase in drug or alcohol  use. Follow these instructions at home: If you think someone may be thinking about or planning suicide: Ask the person directly whether he or she is thinking about suicide or about hurting himself or herself. Asking about thoughts of suicide or self-harm does not make someone more likely to attempt suicide. Avoid giving advice or arguing with the person about the value of his or her life. If a person confides in you that he or she is considering suicide: Take the person seriously. Do not ever ignore  comments about suicide. Listen to the person's thoughts and concerns with compassion. Let the person know that you will stay with him or her. Offer to help the person get to a mental health professional or other health care provider. Remove all weapons and medicines from the person's living area. Do not promise to keep the person's thoughts of suicide a secret. Contact a suicide crisis helpline, such as: The National Suicide Prevention Lifeline at (216)856-1922 or 988 in the [place] The Crisis Text Line by texting HOME to (323)109-4787. Get help right away if: You ever feel like someone may hurt himself or herself or others, or if he or she shares thoughts about taking his or her own life. You can go to your nearest emergency room or: Call a crisis center or a local suicide prevention center. These are often located at hospitals, clinics, [employer], social service providers, or health departments. Call 911. Call the Suicide & Crisis Lifeline (free and confidential): Call 845-492-4261 or 988. Text (204) 215-9739. If your loved one is a Veteran: Call 988 and press 1. Text the [employer] at 706-800-6426. Call the [employer] health and human services helpline (211 in the [place] Summary Suicide is the act of ending, or taking, one's own life. Suicide can be prevented by knowing the risk factors and the signs, and by taking action. If you know someone who has or is showing any risk factors for suicide, ask if he or she is thinking about hurting himself or herself. Take all concerns about suicide seriously, and get support from experts in mental illness or suicide. Get help  right away if you believe that a person may hurt himself or herself or others, or may be having thoughts of taking his or her own life. This information is not intended to replace advice given to you by your health care provider. Make sure you discuss any questions you have with your health care provider. Document  Revised: 05/10/2023 Document Reviewed: 11/12/2020 Elsevier Patient Education  2024 [employer] Inc.  EXERCISE AND DIET:  We recommended that you start or continue a regular exercise program for good health. Regular exercise means any activity that makes your heart beat faster and makes you sweat.  We recommend exercising at least 30 minutes per day at least 3 days a week, preferably 4 or 5.  We also recommend a diet low in fat and sugar.  Inactivity, poor dietary choices and obesity can cause diabetes, heart attack, stroke, and kidney damage, among others.    ALCOHOL  AND SMOKING:  Women should limit their alcohol  intake to no more than 7 drinks/beers/glasses of wine (combined, not each!) per week. Moderation of alcohol  intake to this level decreases your risk of breast cancer and liver damage. And of course, no recreational drugs are part of a healthy lifestyle.  And absolutely no smoking or even second hand smoke. Most people know smoking can cause heart and lung diseases, but did you know it also contributes to weakening of your bones? Aging of your skin?  Yellowing of your teeth and nails?  CALCIUM AND VITAMIN D :  Adequate intake of calcium and Vitamin D  are recommended.  The recommendations for exact amounts of these supplements seem to change often, but generally speaking 600 mg of calcium (either carbonate or citrate) and 800 units of Vitamin D  per day seems prudent. Certain women may benefit from higher intake of Vitamin D .  If you are among these women, your doctor will have told you during your visit.    PAP SMEARS:  Pap smears, to check for cervical cancer or precancers,  have traditionally been done yearly, although recent scientific advances have shown that most women can have pap smears less often.  However, every woman still should have a physical exam from her gynecologist every year. It will include a breast check, inspection of the vulva and vagina to check for abnormal growths or skin  changes, a visual exam of the cervix, and then an exam to evaluate the size and shape of the uterus and ovaries.  And after 28 years of age, a rectal exam is indicated to check for rectal cancers. We will also provide age appropriate advice regarding health maintenance, like when you should have certain vaccines, screening for sexually transmitted diseases, bone density testing, colonoscopy, mammograms, etc.   MAMMOGRAMS:  All women over 75 years old should have a yearly mammogram. Many facilities now offer a 3D mammogram, which may cost around $50 extra out of pocket. If possible,  we recommend you accept the option to have the 3D mammogram performed.  It both reduces the number of women who will be called back for extra views which then turn out to be normal, and it is better than the routine mammogram at detecting truly abnormal areas.    COLONOSCOPY:  Colonoscopy to screen for colon cancer is recommended for all women at age 37.  We know, you hate the idea of the prep.  We agree, BUT, having colon cancer and not knowing it is worse!!  Colon cancer so often starts as a polyp that can be seen  and removed at colonscopy, which can quite literally save your life!  And if your first colonoscopy is normal and you have no family history of colon cancer, most women don't have to have it again for 10 years.  Once every ten years, you can do something that may end up saving your life, right?  We will be happy to help you get it scheduled when you are ready.  Be sure to check your insurance coverage so you understand how much it will cost.  It may be covered as a preventative service at no cost, but you should check your particular policy.

## 2024-04-25 DIAGNOSIS — R278 Other lack of coordination: Secondary | ICD-10-CM | POA: Insufficient documentation

## 2024-04-25 LAB — HIV ANTIBODY (ROUTINE TESTING W REFLEX)
HIV 1&2 Ab, 4th Generation: NONREACTIVE
HIV FINAL INTERPRETATION: NEGATIVE

## 2024-04-25 LAB — RPR: RPR Ser Ql: NONREACTIVE

## 2024-04-25 LAB — HEPATITIS C ANTIBODY: Hepatitis C Ab: NONREACTIVE

## 2024-04-26 ENCOUNTER — Ambulatory Visit: Payer: Self-pay | Admitting: Obstetrics and Gynecology

## 2024-04-26 DIAGNOSIS — F4323 Adjustment disorder with mixed anxiety and depressed mood: Secondary | ICD-10-CM | POA: Diagnosis not present

## 2024-04-26 DIAGNOSIS — K120 Recurrent oral aphthae: Secondary | ICD-10-CM | POA: Diagnosis not present

## 2024-04-26 DIAGNOSIS — K5090 Crohn's disease, unspecified, without complications: Secondary | ICD-10-CM | POA: Diagnosis not present

## 2024-05-02 DIAGNOSIS — R1084 Generalized abdominal pain: Secondary | ICD-10-CM | POA: Diagnosis not present

## 2024-05-02 DIAGNOSIS — K5090 Crohn's disease, unspecified, without complications: Secondary | ICD-10-CM | POA: Diagnosis not present

## 2024-05-02 DIAGNOSIS — K644 Residual hemorrhoidal skin tags: Secondary | ICD-10-CM | POA: Diagnosis not present

## 2024-05-02 DIAGNOSIS — K219 Gastro-esophageal reflux disease without esophagitis: Secondary | ICD-10-CM | POA: Diagnosis not present

## 2024-05-02 DIAGNOSIS — R1032 Left lower quadrant pain: Secondary | ICD-10-CM | POA: Diagnosis not present

## 2024-05-02 DIAGNOSIS — K222 Esophageal obstruction: Secondary | ICD-10-CM | POA: Diagnosis not present

## 2024-05-02 LAB — LAB REPORT - SCANNED

## 2024-05-03 DIAGNOSIS — R591 Generalized enlarged lymph nodes: Secondary | ICD-10-CM | POA: Diagnosis not present

## 2024-05-03 DIAGNOSIS — R293 Abnormal posture: Secondary | ICD-10-CM | POA: Diagnosis not present

## 2024-05-03 DIAGNOSIS — M6289 Other specified disorders of muscle: Secondary | ICD-10-CM | POA: Diagnosis not present

## 2024-05-03 DIAGNOSIS — R1310 Dysphagia, unspecified: Secondary | ICD-10-CM | POA: Diagnosis not present

## 2024-05-03 DIAGNOSIS — Z789 Other specified health status: Secondary | ICD-10-CM | POA: Diagnosis not present

## 2024-05-03 DIAGNOSIS — R1030 Lower abdominal pain, unspecified: Secondary | ICD-10-CM | POA: Diagnosis not present

## 2024-05-03 DIAGNOSIS — M542 Cervicalgia: Secondary | ICD-10-CM | POA: Diagnosis not present

## 2024-05-03 DIAGNOSIS — G8929 Other chronic pain: Secondary | ICD-10-CM | POA: Diagnosis not present

## 2024-05-03 DIAGNOSIS — R278 Other lack of coordination: Secondary | ICD-10-CM | POA: Diagnosis not present

## 2024-05-03 DIAGNOSIS — J385 Laryngeal spasm: Secondary | ICD-10-CM | POA: Diagnosis not present

## 2024-05-03 DIAGNOSIS — C9210 Chronic myeloid leukemia, BCR/ABL-positive, not having achieved remission: Secondary | ICD-10-CM | POA: Diagnosis not present

## 2024-05-03 DIAGNOSIS — R52 Pain, unspecified: Secondary | ICD-10-CM | POA: Diagnosis not present

## 2024-05-03 DIAGNOSIS — R490 Dysphonia: Secondary | ICD-10-CM | POA: Diagnosis not present

## 2024-05-17 DIAGNOSIS — R490 Dysphonia: Secondary | ICD-10-CM | POA: Diagnosis not present

## 2024-05-30 DIAGNOSIS — M270 Developmental disorders of jaws: Secondary | ICD-10-CM | POA: Diagnosis not present

## 2024-05-30 DIAGNOSIS — R682 Dry mouth, unspecified: Secondary | ICD-10-CM | POA: Diagnosis not present

## 2024-05-30 DIAGNOSIS — K120 Recurrent oral aphthae: Secondary | ICD-10-CM | POA: Diagnosis not present

## 2024-05-30 DIAGNOSIS — M7918 Myalgia, other site: Secondary | ICD-10-CM | POA: Diagnosis not present

## 2024-05-30 DIAGNOSIS — M797 Fibromyalgia: Secondary | ICD-10-CM | POA: Diagnosis not present

## 2024-06-01 DIAGNOSIS — F4323 Adjustment disorder with mixed anxiety and depressed mood: Secondary | ICD-10-CM | POA: Diagnosis not present

## 2024-06-01 DIAGNOSIS — J385 Laryngeal spasm: Secondary | ICD-10-CM | POA: Diagnosis not present

## 2024-06-01 DIAGNOSIS — G43109 Migraine with aura, not intractable, without status migrainosus: Secondary | ICD-10-CM | POA: Diagnosis not present

## 2024-06-01 DIAGNOSIS — R1310 Dysphagia, unspecified: Secondary | ICD-10-CM | POA: Diagnosis not present

## 2024-06-05 DIAGNOSIS — C9210 Chronic myeloid leukemia, BCR/ABL-positive, not having achieved remission: Secondary | ICD-10-CM | POA: Diagnosis not present

## 2024-06-05 DIAGNOSIS — N809 Endometriosis, unspecified: Secondary | ICD-10-CM | POA: Diagnosis not present

## 2024-06-06 DIAGNOSIS — G8929 Other chronic pain: Secondary | ICD-10-CM | POA: Diagnosis not present

## 2024-06-06 DIAGNOSIS — R293 Abnormal posture: Secondary | ICD-10-CM | POA: Diagnosis not present

## 2024-06-06 DIAGNOSIS — J385 Laryngeal spasm: Secondary | ICD-10-CM | POA: Diagnosis not present

## 2024-06-06 DIAGNOSIS — G932 Benign intracranial hypertension: Secondary | ICD-10-CM | POA: Diagnosis not present

## 2024-06-06 DIAGNOSIS — R1310 Dysphagia, unspecified: Secondary | ICD-10-CM | POA: Diagnosis not present

## 2024-06-06 DIAGNOSIS — Z789 Other specified health status: Secondary | ICD-10-CM | POA: Diagnosis not present

## 2024-06-06 DIAGNOSIS — R591 Generalized enlarged lymph nodes: Secondary | ICD-10-CM | POA: Diagnosis not present

## 2024-06-06 DIAGNOSIS — E66811 Obesity, class 1: Secondary | ICD-10-CM | POA: Diagnosis not present

## 2024-06-06 DIAGNOSIS — M542 Cervicalgia: Secondary | ICD-10-CM | POA: Diagnosis not present

## 2024-06-06 DIAGNOSIS — R278 Other lack of coordination: Secondary | ICD-10-CM | POA: Diagnosis not present

## 2024-06-06 DIAGNOSIS — R490 Dysphonia: Secondary | ICD-10-CM | POA: Diagnosis not present

## 2024-06-06 DIAGNOSIS — C9210 Chronic myeloid leukemia, BCR/ABL-positive, not having achieved remission: Secondary | ICD-10-CM | POA: Diagnosis not present

## 2024-06-06 DIAGNOSIS — M6289 Other specified disorders of muscle: Secondary | ICD-10-CM | POA: Diagnosis not present

## 2024-06-06 DIAGNOSIS — R1030 Lower abdominal pain, unspecified: Secondary | ICD-10-CM | POA: Diagnosis not present

## 2024-06-06 DIAGNOSIS — G43719 Chronic migraine without aura, intractable, without status migrainosus: Secondary | ICD-10-CM | POA: Diagnosis not present

## 2024-06-06 DIAGNOSIS — R52 Pain, unspecified: Secondary | ICD-10-CM | POA: Diagnosis not present

## 2024-06-11 DIAGNOSIS — R52 Pain, unspecified: Secondary | ICD-10-CM | POA: Diagnosis not present

## 2024-06-11 DIAGNOSIS — R278 Other lack of coordination: Secondary | ICD-10-CM | POA: Diagnosis not present

## 2024-06-11 DIAGNOSIS — Z789 Other specified health status: Secondary | ICD-10-CM | POA: Diagnosis not present

## 2024-06-11 DIAGNOSIS — M542 Cervicalgia: Secondary | ICD-10-CM | POA: Diagnosis not present

## 2024-06-11 DIAGNOSIS — R1310 Dysphagia, unspecified: Secondary | ICD-10-CM | POA: Diagnosis not present

## 2024-06-11 DIAGNOSIS — F339 Major depressive disorder, recurrent, unspecified: Secondary | ICD-10-CM | POA: Diagnosis not present

## 2024-06-11 DIAGNOSIS — F988 Other specified behavioral and emotional disorders with onset usually occurring in childhood and adolescence: Secondary | ICD-10-CM | POA: Diagnosis not present

## 2024-06-11 DIAGNOSIS — M6289 Other specified disorders of muscle: Secondary | ICD-10-CM | POA: Diagnosis not present

## 2024-06-11 DIAGNOSIS — R591 Generalized enlarged lymph nodes: Secondary | ICD-10-CM | POA: Diagnosis not present

## 2024-06-11 DIAGNOSIS — R490 Dysphonia: Secondary | ICD-10-CM | POA: Diagnosis not present

## 2024-06-11 DIAGNOSIS — F419 Anxiety disorder, unspecified: Secondary | ICD-10-CM | POA: Diagnosis not present

## 2024-06-11 DIAGNOSIS — G8929 Other chronic pain: Secondary | ICD-10-CM | POA: Diagnosis not present

## 2024-06-11 DIAGNOSIS — R1030 Lower abdominal pain, unspecified: Secondary | ICD-10-CM | POA: Diagnosis not present

## 2024-06-11 DIAGNOSIS — R293 Abnormal posture: Secondary | ICD-10-CM | POA: Diagnosis not present

## 2024-06-11 DIAGNOSIS — J385 Laryngeal spasm: Secondary | ICD-10-CM | POA: Diagnosis not present

## 2024-06-11 DIAGNOSIS — C9210 Chronic myeloid leukemia, BCR/ABL-positive, not having achieved remission: Secondary | ICD-10-CM | POA: Diagnosis not present

## 2024-06-12 DIAGNOSIS — J385 Laryngeal spasm: Secondary | ICD-10-CM | POA: Diagnosis not present

## 2024-06-12 DIAGNOSIS — R1030 Lower abdominal pain, unspecified: Secondary | ICD-10-CM | POA: Diagnosis not present

## 2024-06-12 DIAGNOSIS — R52 Pain, unspecified: Secondary | ICD-10-CM | POA: Diagnosis not present

## 2024-06-12 DIAGNOSIS — R293 Abnormal posture: Secondary | ICD-10-CM | POA: Diagnosis not present

## 2024-06-12 DIAGNOSIS — M542 Cervicalgia: Secondary | ICD-10-CM | POA: Diagnosis not present

## 2024-06-12 DIAGNOSIS — M6289 Other specified disorders of muscle: Secondary | ICD-10-CM | POA: Diagnosis not present

## 2024-06-12 DIAGNOSIS — G8929 Other chronic pain: Secondary | ICD-10-CM | POA: Diagnosis not present

## 2024-06-12 DIAGNOSIS — R490 Dysphonia: Secondary | ICD-10-CM | POA: Diagnosis not present

## 2024-06-12 DIAGNOSIS — Z789 Other specified health status: Secondary | ICD-10-CM | POA: Diagnosis not present

## 2024-06-12 DIAGNOSIS — C9210 Chronic myeloid leukemia, BCR/ABL-positive, not having achieved remission: Secondary | ICD-10-CM | POA: Diagnosis not present

## 2024-06-12 DIAGNOSIS — R278 Other lack of coordination: Secondary | ICD-10-CM | POA: Diagnosis not present

## 2024-06-12 DIAGNOSIS — R591 Generalized enlarged lymph nodes: Secondary | ICD-10-CM | POA: Diagnosis not present

## 2024-06-12 DIAGNOSIS — F4323 Adjustment disorder with mixed anxiety and depressed mood: Secondary | ICD-10-CM | POA: Diagnosis not present

## 2024-06-12 DIAGNOSIS — R1310 Dysphagia, unspecified: Secondary | ICD-10-CM | POA: Diagnosis not present

## 2024-06-13 DIAGNOSIS — M7918 Myalgia, other site: Secondary | ICD-10-CM | POA: Diagnosis not present

## 2024-06-13 DIAGNOSIS — G2589 Other specified extrapyramidal and movement disorders: Secondary | ICD-10-CM | POA: Diagnosis not present

## 2024-06-13 DIAGNOSIS — G588 Other specified mononeuropathies: Secondary | ICD-10-CM | POA: Diagnosis not present

## 2024-06-13 DIAGNOSIS — C9210 Chronic myeloid leukemia, BCR/ABL-positive, not having achieved remission: Secondary | ICD-10-CM | POA: Diagnosis not present

## 2024-06-13 DIAGNOSIS — M7552 Bursitis of left shoulder: Secondary | ICD-10-CM | POA: Diagnosis not present

## 2024-06-13 DIAGNOSIS — G8929 Other chronic pain: Secondary | ICD-10-CM | POA: Diagnosis not present

## 2024-06-13 DIAGNOSIS — M7551 Bursitis of right shoulder: Secondary | ICD-10-CM | POA: Diagnosis not present

## 2024-06-19 DIAGNOSIS — D239 Other benign neoplasm of skin, unspecified: Secondary | ICD-10-CM | POA: Diagnosis not present

## 2024-06-19 DIAGNOSIS — L089 Local infection of the skin and subcutaneous tissue, unspecified: Secondary | ICD-10-CM | POA: Diagnosis not present

## 2024-06-19 DIAGNOSIS — F4323 Adjustment disorder with mixed anxiety and depressed mood: Secondary | ICD-10-CM | POA: Diagnosis not present

## 2024-06-19 DIAGNOSIS — D229 Melanocytic nevi, unspecified: Secondary | ICD-10-CM | POA: Diagnosis not present

## 2024-06-22 DIAGNOSIS — M7551 Bursitis of right shoulder: Secondary | ICD-10-CM | POA: Diagnosis not present

## 2024-06-22 DIAGNOSIS — M7552 Bursitis of left shoulder: Secondary | ICD-10-CM | POA: Diagnosis not present

## 2024-06-25 DIAGNOSIS — R293 Abnormal posture: Secondary | ICD-10-CM | POA: Diagnosis not present

## 2024-06-25 DIAGNOSIS — J385 Laryngeal spasm: Secondary | ICD-10-CM | POA: Diagnosis not present

## 2024-06-25 DIAGNOSIS — C9210 Chronic myeloid leukemia, BCR/ABL-positive, not having achieved remission: Secondary | ICD-10-CM | POA: Diagnosis not present

## 2024-06-25 DIAGNOSIS — Z789 Other specified health status: Secondary | ICD-10-CM | POA: Diagnosis not present

## 2024-06-25 DIAGNOSIS — R490 Dysphonia: Secondary | ICD-10-CM | POA: Diagnosis not present

## 2024-06-25 DIAGNOSIS — M6289 Other specified disorders of muscle: Secondary | ICD-10-CM | POA: Diagnosis not present

## 2024-06-25 DIAGNOSIS — M542 Cervicalgia: Secondary | ICD-10-CM | POA: Diagnosis not present

## 2024-06-25 DIAGNOSIS — G8929 Other chronic pain: Secondary | ICD-10-CM | POA: Diagnosis not present

## 2024-06-25 DIAGNOSIS — R591 Generalized enlarged lymph nodes: Secondary | ICD-10-CM | POA: Diagnosis not present

## 2024-06-25 DIAGNOSIS — R1310 Dysphagia, unspecified: Secondary | ICD-10-CM | POA: Diagnosis not present

## 2024-06-25 DIAGNOSIS — R52 Pain, unspecified: Secondary | ICD-10-CM | POA: Diagnosis not present

## 2024-06-25 DIAGNOSIS — R278 Other lack of coordination: Secondary | ICD-10-CM | POA: Diagnosis not present

## 2024-06-25 DIAGNOSIS — R1030 Lower abdominal pain, unspecified: Secondary | ICD-10-CM | POA: Diagnosis not present

## 2024-07-06 DIAGNOSIS — K581 Irritable bowel syndrome with constipation: Secondary | ICD-10-CM | POA: Diagnosis not present

## 2024-07-06 DIAGNOSIS — Z13220 Encounter for screening for lipoid disorders: Secondary | ICD-10-CM | POA: Diagnosis not present

## 2024-07-06 DIAGNOSIS — C9210 Chronic myeloid leukemia, BCR/ABL-positive, not having achieved remission: Secondary | ICD-10-CM | POA: Diagnosis not present

## 2024-07-06 LAB — COMPREHENSIVE METABOLIC PANEL WITH GFR
Calcium: 8.9 (ref 8.7–10.7)
eGFR: 90

## 2024-07-06 LAB — BASIC METABOLIC PANEL WITH GFR
BUN: 9 (ref 4–21)
CO2: 29 — AB (ref 13–22)
Chloride: 98 — AB (ref 99–108)
Creatinine: 0.7 (ref 0.5–1.1)
Glucose: 103
Potassium: 3.8 meq/L (ref 3.5–5.1)
Sodium: 134 — AB (ref 137–147)

## 2024-07-06 LAB — CBC AND DIFFERENTIAL
HCT: 38 (ref 36–46)
Hemoglobin: 12.5 (ref 12.0–16.0)
Platelets: 340 K/uL (ref 150–400)
WBC: 12.7

## 2024-07-06 LAB — HEPATIC FUNCTION PANEL
ALT: 33 U/L (ref 7–35)
AST: 27 (ref 13–35)
Alkaline Phosphatase: 102 (ref 25–125)

## 2024-07-10 DIAGNOSIS — G43719 Chronic migraine without aura, intractable, without status migrainosus: Secondary | ICD-10-CM | POA: Diagnosis not present

## 2024-07-10 DIAGNOSIS — R1310 Dysphagia, unspecified: Secondary | ICD-10-CM | POA: Diagnosis not present

## 2024-07-10 DIAGNOSIS — R09A2 Foreign body sensation, throat: Secondary | ICD-10-CM | POA: Diagnosis not present

## 2024-07-10 DIAGNOSIS — J385 Laryngeal spasm: Secondary | ICD-10-CM | POA: Diagnosis not present

## 2024-07-10 DIAGNOSIS — R490 Dysphonia: Secondary | ICD-10-CM | POA: Diagnosis not present

## 2024-07-24 ENCOUNTER — Ambulatory Visit: Payer: Self-pay | Admitting: Internal Medicine

## 2024-07-24 ENCOUNTER — Other Ambulatory Visit: Payer: Self-pay

## 2024-07-24 MED ORDER — ALBUTEROL SULFATE HFA 108 (90 BASE) MCG/ACT IN AERS: 1.0000 | INHALATION_SPRAY | Freq: Four times a day (QID) | RESPIRATORY_TRACT | 5 refills | Status: AC | PRN

## 2024-07-24 NOTE — Telephone Encounter (Signed)
 FYI Only or Action Required?: Action required by provider: medication refill request. Ventolin . ED Advised.  Patient was last seen in primary care on 01/31/2024 by Alvia Corean CROME, FNP.  Called Nurse Triage reporting Shortness of Breath.  Symptoms began today.  Interventions attempted: Nothing.  Symptoms are: unchanged.  Triage Disposition: Go to ED Now (Notify PCP)  Patient/caregiver understands and will follow disposition?: Yes  Copied from CRM [id number]. Topic: Clinical - Red Word Triage >> Jul 24, 2024  4:16 PM Alexandria E wrote: [facility] that prompted transfer to Nurse Triage: Patient is having shortness of breath due to not having her albuterol  (VENTOLIN  HFA) 108 (90 Base) MCG/ACT inhaler. Reason for Disposition  [1] MODERATE asthma attack (e.g., SOB at rest, speaks in phrases, audible wheezes) AND [2] doesn't have neb or inhaler available  Answer Assessment - Initial Assessment Questions Patient says she doesn't want to go to ER because they never see it as an emergency. Patient is going to try to go to ER though. She is out of ventolin . Last year had similar symptoms and had bronchitis.   [facility]. RESPIRATORY STATUS: Describe your breathing? (e.g., wheezing, shortness of breath, unable to speak, severe coughing)      Chest and throat tightness, SOB with exertion even just getting out of the car and walking a little ways, sometimes having trouble swallowing. 2. ONSET: When did this asthma attack begin?      This morning 3. TRIGGER: What do you think triggered this attack? (e.g., URI, exposure to pollen or other allergen, tobacco smoke)      Weather and travel change 5. SEVERITY: How bad is this attack?      Mild-moderate 6. ASTHMA MEDICINES:  What treatments have you tried?      Ventolin  inhaler 8. OTHER SYMPTOMS: Do you have any other symptoms? (e.g., chest pain, coughing up yellow sputum, fever, runny nose) Chest, cough dry  Protocols  used: Asthma Attack-A-AH

## 2024-07-24 NOTE — Telephone Encounter (Signed)
Inhaler has been sent.

## 2024-07-30 DIAGNOSIS — F4310 Post-traumatic stress disorder, unspecified: Secondary | ICD-10-CM | POA: Diagnosis not present

## 2024-07-30 DIAGNOSIS — F988 Other specified behavioral and emotional disorders with onset usually occurring in childhood and adolescence: Secondary | ICD-10-CM | POA: Diagnosis not present

## 2024-07-30 DIAGNOSIS — F419 Anxiety disorder, unspecified: Secondary | ICD-10-CM | POA: Diagnosis not present

## 2024-07-30 DIAGNOSIS — F339 Major depressive disorder, recurrent, unspecified: Secondary | ICD-10-CM | POA: Diagnosis not present

## 2024-08-07 ENCOUNTER — Encounter: Payer: Self-pay | Admitting: Internal Medicine

## 2024-08-07 ENCOUNTER — Ambulatory Visit: Admitting: Internal Medicine

## 2024-08-07 ENCOUNTER — Ambulatory Visit

## 2024-08-07 ENCOUNTER — Ambulatory Visit: Payer: Self-pay | Admitting: Internal Medicine

## 2024-08-07 VITALS — BP 124/84 | HR 83 | Temp 98.6°F | Resp 16 | Ht 69.5 in | Wt 216.2 lb

## 2024-08-07 DIAGNOSIS — J22 Unspecified acute lower respiratory infection: Secondary | ICD-10-CM | POA: Diagnosis not present

## 2024-08-07 DIAGNOSIS — R052 Subacute cough: Secondary | ICD-10-CM

## 2024-08-07 MED ORDER — HYDROCODONE BIT-HOMATROP MBR 5-1.5 MG/5ML PO SOLN: 5.0000 mL | Freq: Three times a day (TID) | ORAL | 0 refills | Status: DC | PRN

## 2024-08-07 MED ORDER — MOXIFLOXACIN HCL 400 MG PO TABS: 400.0000 mg | ORAL_TABLET | Freq: Every day | ORAL | 0 refills | Status: AC

## 2024-08-07 NOTE — Progress Notes (Signed)
 "  Subjective:  Patient ID: Desiree Carlson, female    DOB: 1996/02/06  Age: 29 y.o. MRN: [id number]  CC: Cough Luna and red rusted looked phlegm ), Fever, Chills (Sweats ), Nasal Congestion (Green and red when she blows her nose ), Nausea, Diarrhea, Generalized Body Aches, Ear Fullness, and Pain (Rib pain in the middle of her chest on the right side she feels lumps )   HPI Desiree Carlson presents for URI  --  Discussed the use of AI scribe software for clinical note transcription with the patient, who gave verbal consent to proceed.  History of Present Illness Desiree Carlson is a 29 year old female with asthma and leukemia who presents with respiratory symptoms and fever.  She has been experiencing respiratory symptoms for about a week, including difficulty catching her breath and frequent coughing. Initially, these symptoms improved with the use of a rescue inhaler, but worsened around New Year's Eve. Her cough is productive with brown and rust-colored sputum. She also has fever, chills, and night sweats, with an inability to maintain a stable body temperature. There is tenderness in her chest and occasional shooting pain near her collarbone when coughing.  She has a history of asthma and uses a rescue inhaler during flare-ups. She is currently on dasatinib , an oral chemotherapy, for leukemia. She has not received a COVID or flu shot this fall.  She reports nausea and diarrhea. She also mentions having itchy armpits and swollen glands.  Her last menstrual cycle was in November, and she has endometriosis, which causes her to skip cycles and experience heavier subsequent cycles. She is not on contraception and is not sexually active.     Outpatient Medications Prior to Visit  Medication Sig Dispense Refill   acetaminophen  (TYLENOL ) 500 MG tablet Take 1,000 mg by mouth.     albuterol  (VENTOLIN  HFA) 108 (90 Base) MCG/ACT inhaler Inhale 1-2 puffs into the lungs every 6 (six) hours as  needed for wheezing or shortness of breath. 6.7 g 5   ALPRAZolam (XANAX) 0.5 MG tablet TAKE 1 TABLET BY MOUTH TWICE DAILY AS NEEDED FOR ANXIETY FOR 30 DAYS     amphetamine-dextroamphetamine (ADDERALL XR) 30 MG 24 hr capsule Take 30 mg by mouth every morning.     budesonide -formoterol  (SYMBICORT ) 160-4.5 MCG/ACT inhaler Inhale 2 puffs into the lungs 2 (two) times daily as needed. 10.2 g 8   Calcium Carb-Cholecalciferol (OYSTER SHELL CALCIUM 250+D) 250-3.125 MG-MCG TABS Take 1 tablet by mouth.     ciprofloxacin -dexamethasone  (CIPRODEX ) OTIC suspension Place 4 drops into the left ear 2 (two) times daily for 7 days. 7.5 mL 0   clobetasol  ointment (TEMOVATE ) 0.05 % Apply 1 Application topically 2 (two) times daily. Place in a thin layer of the affected areas twice a day for 2 weeks.  Then place on the affected areas twice a week at bedtime. 60 g 1   cyanocobalamin  100 MCG tablet Take 100 mcg by mouth.     cyclobenzaprine (FLEXERIL) 10 MG tablet Take 10 mg by mouth.     dasatinib  (SPRYCEL ) 50 MG tablet Take 50 mg by mouth.     DERMOTIC 0.01 % OIL PLACE 3 DROPS IN BOTH EARS TWICE PER DAY FOR ONE WEEK THEN ON WED & SAT     diclofenac Sodium (VOLTAREN) 1 % GEL as needed.     fluticasone (FLONASE) 50 MCG/ACT nasal spray 1 spray into each nostril daily.     gabapentin (NEURONTIN) 100 MG capsule Take  100 mg by mouth.     ibuprofen  (ADVIL ) 200 MG tablet Take by mouth as needed.     lidocaine  (XYLOCAINE ) 2 % solution      loperamide (IMODIUM) 2 MG capsule Take 1 capsule (2 mg) total by mouth once daily as needed for diarrhea.     loratadine (CLARITIN) 10 MG tablet Take by mouth.     LORazepam (ATIVAN) 1 MG tablet Take 1 mg by mouth.     montelukast  (SINGULAIR ) 10 MG tablet Take 1 tablet (10 mg total) by mouth daily. Annual appt due in Sept must see provider for future refills 90 tablet 0   nystatin  (MYCOSTATIN /NYSTOP ) powder Apply 1 Application topically 3 (three) times daily. Apply to affected area for up to  7 days 15 g 2   nystatin  ointment (MYCOSTATIN ) Apply 1 Application topically 2 (two) times daily. Apply to affected area for up to 7 days. 30 g 1   ondansetron  (ZOFRAN ) 4 MG tablet Take 1 tablet by mouth up to two times a day as needed for nausea. Second choice.     pilocarpine (SALAGEN) 5 MG tablet Take 5 mg by mouth 3 (three) times daily.     rizatriptan (MAXALT) 10 MG tablet Take 10 mg by mouth.     venlafaxine XR (EFFEXOR-XR) 150 MG 24 hr capsule Take 150 mg by mouth daily.     WEGOVY 0.5 MG/0.5ML SOAJ SQ injection SMARTSIG:0.5 Milliliter(s) SUB-Q Once a Week     zonisamide (ZONEGRAN) 25 MG capsule      asciminib hcl (SCEMBLIX) 40 MG tablet Take by mouth. (Patient not taking: Reported on 08/07/2024)     diazepam  (VALIUM ) 5 MG tablet Place 1 tablet vaginally nightly as needed for muscle spasm/ pelvic pain. (Patient not taking: Reported on 08/07/2024) 30 tablet 1   norethindrone  (ORTHO MICRONOR ) 0.35 MG tablet Take 1 tablet (0.35 mg total) by mouth daily. (Patient not taking: Reported on 08/07/2024) 28 tablet 11   norethindrone -ethinyl estradiol -iron (LOESTRIN FE) 1.5-30 MG-MCG tablet Take 1 tablet by mouth daily. (Patient not taking: Reported on 08/07/2024)     prochlorperazine  (COMPAZINE ) 10 MG tablet Take by mouth. (Patient not taking: Reported on 08/07/2024)     No facility-administered medications prior to visit.    ROS Review of Systems  Constitutional:  Positive for chills and fever. Negative for appetite change, fatigue and unexpected weight change.  HENT: Negative.  Negative for sinus pressure, sinus pain, sore throat and trouble swallowing.   Eyes: Negative.  Negative for visual disturbance.  Respiratory:  Positive for cough. Negative for choking, chest tightness, shortness of breath and wheezing.   Cardiovascular:  Negative for chest pain, palpitations and leg swelling.  Gastrointestinal:  Positive for diarrhea and nausea. Negative for abdominal pain, blood in stool, constipation and  vomiting.  Endocrine: Negative.   Genitourinary:  Negative for difficulty urinating.  Musculoskeletal: Negative.   Skin: Negative.   Neurological:  Negative for dizziness and weakness.  Hematological:  Negative for adenopathy. Does not bruise/bleed easily.  Psychiatric/Behavioral: Negative.      Objective:  BP 124/84 (BP Location: Left Arm, Patient Position: Sitting, Cuff Size: Normal)   Pulse 83   Temp 98.6 F (37 C) (Temporal)   Resp 16   Ht 5' 9.5 (1.765 m)   Wt 216 lb 3.2 oz (98.1 kg)   LMP 06/20/2024 (Within Weeks)   SpO2 96%   BMI 31.47 kg/m   BP Readings from Last 3 Encounters:  08/07/24 124/84  04/24/24 108/66  03/14/24 124/80    Wt Readings from Last 3 Encounters:  08/07/24 216 lb 3.2 oz (98.1 kg)  04/24/24 221 lb (100.2 kg)  03/14/24 225 lb (102.1 kg)    Physical Exam Vitals reviewed.  Constitutional:      General: She is not in acute distress.    Appearance: Normal appearance. She is not toxic-appearing or diaphoretic.  HENT:     Nose: Nose normal.     Mouth/Throat:     Mouth: Mucous membranes are moist.  Eyes:     General: No scleral icterus.    Conjunctiva/sclera: Conjunctivae normal.  Cardiovascular:     Rate and Rhythm: Normal rate and regular rhythm.     Heart sounds: No murmur heard.    No friction rub. No gallop.  Pulmonary:     Effort: Pulmonary effort is normal.     Breath sounds: No stridor. No wheezing, rhonchi or rales.  Chest:     Chest wall: No mass or tenderness.  Abdominal:     General: Abdomen is flat. Bowel sounds are normal. There is no distension.     Palpations: There is no mass.     Tenderness: There is no abdominal tenderness. There is no guarding.     Hernia: No hernia is present.  Musculoskeletal:        General: Normal range of motion.     Cervical back: Neck supple.     Right lower leg: No edema.     Left lower leg: No edema.  Lymphadenopathy:     Head:     Right side of head: No submental, submandibular,  tonsillar, preauricular, posterior auricular or occipital adenopathy.     Left side of head: No submental, submandibular, tonsillar, preauricular, posterior auricular or occipital adenopathy.     Cervical: No cervical adenopathy.     Upper Body:     Right upper body: No supraclavicular, axillary or pectoral adenopathy.     Left upper body: No supraclavicular, axillary or pectoral adenopathy.  Skin:    General: Skin is warm and dry.     Findings: No rash.  Neurological:     General: No focal deficit present.     Mental Status: She is alert.  Psychiatric:        Mood and Affect: Mood normal.        Behavior: Behavior normal.     Lab Results  Component Value Date   WBC 12.7 07/06/2024   HGB 12.5 07/06/2024   HCT 38 07/06/2024   PLT 340 07/06/2024   GLUCOSE 82 01/03/2024   CHOL 206 (H) 03/22/2018   TRIG 88 03/22/2018   HDL 60 03/22/2018   LDLCALC 128 (H) 03/22/2018   ALT 33 07/06/2024   AST 27 07/06/2024   NA 134 (A) 07/06/2024   K 3.8 07/06/2024   CL 98 (A) 07/06/2024   CREATININE 0.7 07/06/2024   BUN 9 07/06/2024   CO2 29 (A) 07/06/2024   TSH 1.56 01/03/2024   HGBA1C 5.0 03/23/2022    DG Chest 2 View Result Date: 08/07/2024 EXAM: 2 VIEW(S) XRAY OF THE CHEST 08/07/2024 04:03:59 PM COMPARISON: 12/21/2023 CLINICAL HISTORY: cough FINDINGS: LUNGS AND PLEURA: No focal pulmonary opacity. No pleural effusion. No pneumothorax. HEART AND MEDIASTINUM: No acute abnormality of the cardiac and mediastinal silhouettes. BONES AND SOFT TISSUES: No acute osseous abnormality. IMPRESSION: 1. No acute process. Electronically signed by: Greig Pique MD 08/07/2024 04:45 PM EST RP Workstation: [id number]     Assessment & Plan:  Subacute cough -     DG Chest 2 View; Future -     HYDROcodone  Bit-Homatrop MBr; Take 5 mLs by mouth every 8 (eight) hours as needed for cough.  Dispense: 120 mL; Refill: 0  LRTI (lower respiratory tract infection)- Symptoms are concerning for an atypical  infection. -     HYDROcodone  Bit-Homatrop MBr; Take 5 mLs by mouth every 8 (eight) hours as needed for cough.  Dispense: 120 mL; Refill: 0 -     Moxifloxacin  HCl; Take 1 tablet (400 mg total) by mouth daily for 5 days.  Dispense: 5 tablet; Refill: 0     Follow-up: No follow-ups on file.  Debby Molt, MD "

## 2024-08-13 ENCOUNTER — Encounter: Payer: Self-pay | Admitting: *Deleted

## 2024-08-15 ENCOUNTER — Ambulatory Visit: Payer: Self-pay

## 2024-08-15 ENCOUNTER — Encounter: Payer: Self-pay | Admitting: Family Medicine

## 2024-08-15 ENCOUNTER — Ambulatory Visit: Admitting: Family Medicine

## 2024-08-15 VITALS — BP 110/62 | HR 71 | Temp 97.8°F | Wt 221.0 lb

## 2024-08-15 DIAGNOSIS — J029 Acute pharyngitis, unspecified: Secondary | ICD-10-CM | POA: Diagnosis not present

## 2024-08-15 LAB — POCT URINALYSIS DIPSTICK
Bilirubin, UA: NEGATIVE
Blood, UA: NEGATIVE
Glucose, UA: NEGATIVE
Ketones, UA: NEGATIVE
Leukocytes, UA: NEGATIVE
Nitrite, UA: NEGATIVE
Protein, UA: NEGATIVE
Spec Grav, UA: 1.01
Urobilinogen, UA: 0.2 U/dL
pH, UA: 7.5

## 2024-08-15 LAB — POCT INFLUENZA A/B
Influenza A, POC: NEGATIVE
Influenza B, POC: NEGATIVE

## 2024-08-15 LAB — POCT RAPID STREP A (OFFICE): Rapid Strep A Screen: NEGATIVE

## 2024-08-15 LAB — POC COVID19 BINAXNOW: SARS Coronavirus 2 Ag: NEGATIVE

## 2024-08-15 MED ORDER — CEFUROXIME AXETIL 500 MG PO TABS: 500.0000 mg | ORAL_TABLET | Freq: Two times a day (BID) | ORAL | 0 refills | Status: DC

## 2024-08-15 NOTE — Addendum Note (Signed)
 Addended by: LADONNA INOCENTE SAILOR on: 08/15/2024 05:07 PM   Modules accepted: Orders

## 2024-08-15 NOTE — Progress Notes (Signed)
" ° °  Subjective:    Patient ID: Desiree Carlson, female    DOB: 04-Oct-1995, 29 y.o.   MRN: [id number]  HPI Here for 3 weeks of body aches, headaches, nausea without vomiting, diarrhea, ear pain, ST, and a dry cough. She saw Dr. Joshua on 08-07-24, and he treated her with 5 days of Moxifloxacin . This did not help at all. Now for the past week in addition to the above symptoms she has had lower abdominal crampy pains with some urgency to urinate. She is drinking fluid and taking Ibuprofen .    Review of Systems  Constitutional:  Positive for diaphoresis and fever.  HENT:  Positive for congestion, ear pain and sore throat. Negative for postnasal drip and trouble swallowing.   Eyes: Negative.   Respiratory:  Positive for cough. Negative for shortness of breath and wheezing.   Cardiovascular: Negative.   Gastrointestinal:  Positive for abdominal pain, diarrhea, nausea and vomiting. Negative for abdominal distention and blood in stool.  Genitourinary:  Positive for frequency and urgency. Negative for dysuria and flank pain.       Objective:   Physical Exam Constitutional:      Appearance: She is not ill-appearing.  HENT:     Right Ear: Tympanic membrane, ear canal and external ear normal.     Left Ear: Tympanic membrane, ear canal and external ear normal.     Nose: Nose normal.     Mouth/Throat:     Pharynx: Posterior oropharyngeal erythema present. No oropharyngeal exudate.  Eyes:     Conjunctiva/sclera: Conjunctivae normal.  Pulmonary:     Effort: Pulmonary effort is normal.     Breath sounds: Normal breath sounds.  Abdominal:     General: Bowel sounds are normal. There is no distension.     Palpations: Abdomen is soft. There is no mass.     Tenderness: There is no abdominal tenderness. There is no right CVA tenderness, left CVA tenderness, guarding or rebound.     Hernia: No hernia is present.  Lymphadenopathy:     Cervical: Cervical adenopathy present.  Neurological:     Mental  Status: She is alert.           Assessment & Plan:  Even though her rapid Strep test came back negative, her symptoms strongly suggest she has a Strep pharyngitis. We will treat with 10 days of Cefuroxime . Follow up as needed.  Garnette Olmsted, MD   "

## 2024-08-15 NOTE — Telephone Encounter (Signed)
 FYI Only or Action Required?: FYI only for provider: appointment scheduled on 08/15/24.  Patient was last seen in primary care on 08/07/2024 by Joshua Debby CROME, MD.  Called Nurse Triage reporting Sinusitis, Generalized Body Aches, and Sore Throat.  Symptoms began several weeks ago.  Interventions attempted: OTC medications: Tylenol  and Prescription medications: Moxifloxacin .  Symptoms are: gradually worsening.  Triage Disposition: See HCP Within 4 Hours (Or PCP Triage)  Patient/caregiver understands and will follow disposition?: Yes   Seen on Tuesday for flu like symptoms with cough with rusty brown mucus. CXR was fine. Placed on abx. Finished on 1/11. Improved initially but has been feeling worse since. Sinus pressure, headaches, ear fullness, nausea, sore throat, body aches.  No SOB. Has not checked temp, felt chilled and feverish. 8/10 headache. Hx CML and chronic severe headaches. Scheduled appt with provider at different office in pt region d/t no availability at home office with any provider within timeframe. Advised UC or ED for worsening symptoms.    Copied from CRM [id number]. Topic: Clinical - Red Word Triage >> Aug 15, 2024 11:21 AM Donna BRAVO wrote: Red Word that prompted transfer to Nurse Triage:  finished antibiotic 08/12/24 felling worse severe body ache coughing up brown/rusty color head pressure nausea really bad chills Reason for Disposition  [1] SEVERE sinus pain (e.g., excruciating) AND [2] not improved 2 hours after pain medicine  Answer Assessment - Initial Assessment Questions 1. LOCATION: Where does it hurt?      Sinus, head, throat, entire body  2. ONSET: When did the sinus pain start?  (e.g., hours, days)      2 weeks  3. SEVERITY: How bad is the pain?   (Scale 0-10; or none, mild, moderate or severe)     Moderate to severe sinus pain. 8/10 headache.  4. RECURRENT SYMPTOM: Have you ever had sinus problems before? If Yes, ask: When was the last  time? and What happened that time?      Yes. Seen in office on 1/11 and prescribed abxs. Symptoms improved but never went away.  5. NASAL CONGESTION: Is the nose blocked? If Yes, ask: Can you open it or must you breathe through your mouth?     *No Answer*  6. NASAL DISCHARGE: Do you have discharge from your nose? If so ask, What color?     Rusty brown mucus  7. FEVER: Do you have a fever? If Yes, ask: What is it, how was it measured, and when did it start?      Has not checked temp, felt chilled and feverish  8. OTHER SYMPTOMS: Do you have any other symptoms? (e.g., sore throat, cough, earache, difficulty breathing)     Sore throat, ear congestion, nausea, body aches  9. PREGNANCY: Is there any chance you are pregnant? When was your last menstrual period?     Denies  Protocols used: Sinus Pain or Congestion-A-AH

## 2024-08-17 ENCOUNTER — Encounter: Payer: Self-pay | Admitting: Internal Medicine

## 2024-08-17 ENCOUNTER — Ambulatory Visit: Payer: Self-pay

## 2024-08-17 ENCOUNTER — Ambulatory Visit: Admitting: Internal Medicine

## 2024-08-17 VITALS — BP 102/60 | HR 70 | Temp 98.4°F | Ht 69.5 in | Wt 220.0 lb

## 2024-08-17 DIAGNOSIS — J069 Acute upper respiratory infection, unspecified: Secondary | ICD-10-CM

## 2024-08-17 DIAGNOSIS — R5382 Chronic fatigue, unspecified: Secondary | ICD-10-CM | POA: Diagnosis not present

## 2024-08-17 DIAGNOSIS — R590 Localized enlarged lymph nodes: Secondary | ICD-10-CM

## 2024-08-17 DIAGNOSIS — M791 Myalgia, unspecified site: Secondary | ICD-10-CM | POA: Diagnosis not present

## 2024-08-17 DIAGNOSIS — M797 Fibromyalgia: Secondary | ICD-10-CM | POA: Diagnosis not present

## 2024-08-17 LAB — CBC WITH DIFFERENTIAL/PLATELET
Basophils Absolute: 0.1 K/uL (ref 0.0–0.1)
Basophils Relative: 0.6 % (ref 0.0–3.0)
Eosinophils Absolute: 0.2 K/uL (ref 0.0–0.7)
Eosinophils Relative: 2.2 % (ref 0.0–5.0)
HCT: 38.3 % (ref 36.0–46.0)
Hemoglobin: 12.7 g/dL (ref 12.0–15.0)
Lymphocytes Relative: 22.3 % (ref 12.0–46.0)
Lymphs Abs: 2.5 K/uL (ref 0.7–4.0)
MCHC: 33 g/dL (ref 30.0–36.0)
MCV: 86 fl (ref 78.0–100.0)
Monocytes Absolute: 1.4 K/uL — ABNORMAL HIGH (ref 0.1–1.0)
Monocytes Relative: 12.3 % — ABNORMAL HIGH (ref 3.0–12.0)
Neutro Abs: 6.9 K/uL (ref 1.4–7.7)
Neutrophils Relative %: 62.6 % (ref 43.0–77.0)
Platelets: 331 K/uL (ref 150.0–400.0)
RBC: 4.46 Mil/uL (ref 3.87–5.11)
RDW: 13.7 % (ref 11.5–15.5)
WBC: 11.1 K/uL — ABNORMAL HIGH (ref 4.0–10.5)

## 2024-08-17 LAB — COMPREHENSIVE METABOLIC PANEL WITH GFR
ALT: 22 U/L (ref 3–35)
AST: 19 U/L (ref 5–37)
Albumin: 4.2 g/dL (ref 3.5–5.2)
Alkaline Phosphatase: 101 U/L (ref 39–117)
BUN: 9 mg/dL (ref 6–23)
CO2: 32 meq/L (ref 19–32)
Calcium: 8.9 mg/dL (ref 8.4–10.5)
Chloride: 101 meq/L (ref 96–112)
Creatinine, Ser: 0.71 mg/dL (ref 0.40–1.20)
GFR: 115.37 mL/min
Glucose, Bld: 84 mg/dL (ref 70–99)
Potassium: 3.8 meq/L (ref 3.5–5.1)
Sodium: 139 meq/L (ref 135–145)
Total Bilirubin: 0.2 mg/dL (ref 0.2–1.2)
Total Protein: 7.5 g/dL (ref 6.0–8.3)

## 2024-08-17 LAB — C-REACTIVE PROTEIN: CRP: 1.4 mg/dL (ref 1.0–20.0)

## 2024-08-17 LAB — SEDIMENTATION RATE: Sed Rate: 31 mm/h — ABNORMAL HIGH (ref 0–20)

## 2024-08-17 NOTE — Telephone Encounter (Signed)
 Visit scheduled.

## 2024-08-17 NOTE — Progress Notes (Signed)
 "   Subjective:    Patient ID: Desiree Carlson, female    DOB: 10-03-1995, 29 y.o.   MRN: [id number]      HPI Desiree Carlson is here for  Chief Complaint  Patient presents with   Fatigue    Fatigue, hurts to swallow, swollen lymph nodes, pain in collarbone and it affects her breathing; discharge from ears and nose, face tenderness; bouts of nausea and constipated; burning with urination; feels worse now then she did 2 days ago   1/6-seen by Dr. Joshua for cough that was productive of brown with possible specks of blood mucus, fever, chills, nasal congestion with discolored mucus, nausea, diarrhea, body, ear fullness and right-sided rib pain.  Chest x-ray was negative.  Diagnosis subacute cough, lower respiratory tract infection-Hycodan cough syrup, moxifloxacin  x 5 d  1/14 seen by Dr. Maury weeks body aches, headaches, nausea, diarrhea, ear pain, sore throat and cough.  Also stated lower abdominal pain and urinary urgency.  Moxifloxacin  did not help.  COVID, flu and strep test negative.  UA negative.  Diagnosis pharyngitis-prescribed cefuroxime  500 mg twice daily x 10-day   Discussed the use of AI scribe software for clinical note transcription with the patient, who gave verbal consent to proceed.  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  She has been experiencing systemic symptoms for the past three weeks, including full body pain, swollen and painful lymph nodes, particularly in the neck, face tenderness, nasal drainage, ear congestion, and a dry cough with occasional expectoration of sputum containing red and brown specks. Night sweats, severe fatigue, and difficulty swallowing and speaking due to neck discomfort are also present. Additionally, she experiences nausea, constipation, and urinary symptoms such as burning and bubbles in the urine.  Initially, she was  prescribed moxifloxacin  on January 6, which did not alleviate her symptoms. Two days ago, she was prescribed cefdinir, but her symptoms have worsened since starting this medication. Her lymph nodes have become more painful and swollen, and she reports a new painful node in the past few days.  She describes her fatigue as overwhelming, stating she could 'literally sleep twenty-four hours a day and still be tired.' Episodes of dizziness and lightheadedness often accompany stomach pain and nausea. She also reports intermittent chest pain, particularly around the collarbone, and occasional wheezing, which she associates with asthma attacks.  She has been using her inhaler and taking ibuprofen  for symptom management.  She has a history of chronic myeloid leukemia and has been under the care of various specialists, including pain management, ENT, and neurology. She has not had blood work done since the onset of her current symptoms.         Medications and allergies reviewed with patient and updated if appropriate.  Medications Ordered Prior to Encounter[1]  Review of Systems  Constitutional:  Positive for diaphoresis, fatigue and fever (subjective).  HENT:  Positive for congestion, ear pain (clogged), rhinorrhea, sinus pressure, sinus pain and trouble swallowing.        Increase LN swollen in neck and under chin  Respiratory:  Positive for cough (dry, occ mucus- brown and red specks), shortness of breath and wheezing (intermittent).   Cardiovascular:  Positive for chest pain. Negative for palpitations.  Gastrointestinal:  Positive for abdominal pain (comes with nausea and dizziness/lightheadedness - transient), constipation and nausea.  Genitourinary:  Positive for dysuria.  Occ bubbles in urine  Musculoskeletal:        Whole body pain  Neurological:  Positive for dizziness, light-headedness and headaches.       Objective:   Vitals:   08/17/24 1532  BP: 102/60  Pulse: 70  Temp:  98.4 F (36.9 C)  SpO2: 96%   BP Readings from Last 3 Encounters:  08/17/24 102/60  08/15/24 110/62  08/07/24 124/84   Wt Readings from Last 3 Encounters:  08/17/24 220 lb (99.8 kg)  08/15/24 221 lb (100.2 kg)  08/07/24 216 lb 3.2 oz (98.1 kg)   Body mass index is 32.02 kg/m.    Physical Exam Constitutional:      General: She is not in acute distress.    Appearance: Normal appearance. She is not ill-appearing.  HENT:     Head: Normocephalic and atraumatic.     Right Ear: Tympanic membrane, ear canal and external ear normal.     Left Ear: Tympanic membrane, ear canal and external ear normal.     Mouth/Throat:     Mouth: Mucous membranes are moist.     Pharynx: No oropharyngeal exudate or posterior oropharyngeal erythema.  Eyes:     Conjunctiva/sclera: Conjunctivae normal.  Cardiovascular:     Rate and Rhythm: Normal rate and regular rhythm.  Pulmonary:     Effort: Pulmonary effort is normal. No respiratory distress.     Breath sounds: Normal breath sounds. No wheezing or rales.  Abdominal:     General: There is no distension.     Palpations: Abdomen is soft.     Tenderness: There is no abdominal tenderness.  Musculoskeletal:     Cervical back: Neck supple. No tenderness.  Lymphadenopathy:     Cervical: Cervical adenopathy (Bilateral-chronic-?  Worse) present.  Skin:    General: Skin is warm and dry.     Findings: No rash.  Neurological:     Mental Status: She is alert.          DG Chest 2 View EXAM: 2 VIEW(S) XRAY OF THE CHEST 08/07/2024 04:03:59 PM  COMPARISON: 12/21/2023  CLINICAL HISTORY: cough  FINDINGS:  LUNGS AND PLEURA: No focal pulmonary opacity. No pleural effusion. No pneumothorax.  HEART AND MEDIASTINUM: No acute abnormality of the cardiac and mediastinal silhouettes.  BONES AND SOFT TISSUES: No acute osseous abnormality.  IMPRESSION: 1. No acute process.  Electronically signed by: Greig Pique MD 08/07/2024 04:45 PM EST  RP Workstation: [id number]    Assessment & Plan:    See Problem List for Assessment and Plan of chronic medical problems.    Assessment and Plan Assessment & Plan Viral upper respiratory tract infection Symptoms suggest viral etiology. First antibiotic ineffective and I do not think her current antibiotic will help. - Discontinued cefdinir. - Ordered blood work for blood counts, kidney function, liver tests, and inflammatory markers. - Advised ibuprofen , warm baths, and saline spray for symptom relief. - Recommended guaifenesin for cough.  Fibromyalgia flare, myalgia Generalized pain and fatigue likely exacerbated by viral infection.  She states this is worse than any other fibro flare she has had, but could be contributing - Continue ibuprofen  and warm baths. - Encouraged rest and hydration. - Will check CRP, ESR, but told her this is very nonspecific and would not necessarily change anything -?  Underlying autoimmune disease-has been referred to rheumatology, but has been evaluated by them in the past  Chronic fatigue Severe fatigue possibly worsened by viral infection and fibromyalgia. - Encouraged rest and hydration. -  Advised monitoring symptoms and further evaluation if persistent.  Cervical lymphadenopathy Chronic, possibly worse.  She has had this evaluated by ENT on several occasions. Swollen neck lymph nodes possibly related to viral infection or fibromyalgia. - Monitor lymph node size and symptoms.    Many of her current symptoms are chronic in nature, but worse I do think she is experiencing some shoulder flare of either all of her symptoms, fibromyalgia or?  Autoimmune disease  I personally spent a total of 24 minutes in the care of the patient today including getting/reviewing separately obtained history, performing a medically appropriate exam/evaluation, counseling and educating, placing orders, and documenting clinical information in the EHR.     [1]   Current Outpatient Medications on File Prior to Visit  Medication Sig Dispense Refill   acetaminophen  (TYLENOL ) 500 MG tablet Take 1,000 mg by mouth.     albuterol  (VENTOLIN  HFA) 108 (90 Base) MCG/ACT inhaler Inhale 1-2 puffs into the lungs every 6 (six) hours as needed for wheezing or shortness of breath. 6.7 g 5   ALPRAZolam  (XANAX ) 0.5 MG tablet TAKE 1 TABLET BY MOUTH TWICE DAILY AS NEEDED FOR ANXIETY FOR 30 DAYS     amphetamine -dextroamphetamine  (ADDERALL XR) 30 MG 24 hr capsule Take 30 mg by mouth every morning.     asciminib hcl (SCEMBLIX) 40 MG tablet Take by mouth.     Baclofen 5 MG TABS Take 5 mg by mouth.     budesonide -formoterol  (SYMBICORT ) 160-4.5 MCG/ACT inhaler Inhale 2 puffs into the lungs 2 (two) times daily as needed. 10.2 g 8   Calcium Carb-Cholecalciferol (OYSTER SHELL CALCIUM 250+D) 250-3.125 MG-MCG TABS Take 1 tablet by mouth.     cefUROXime  (CEFTIN ) 500 MG tablet Take 1 tablet (500 mg total) by mouth 2 (two) times daily with a meal for 10 days. 20 tablet 0   clobetasol  ointment (TEMOVATE ) 0.05 % Apply 1 Application topically 2 (two) times daily. Place in a thin layer of the affected areas twice a day for 2 weeks.  Then place on the affected areas twice a week at bedtime. 60 g 1   cyanocobalamin  100 MCG tablet Take 100 mcg by mouth.     dasatinib  (SPRYCEL ) 50 MG tablet Take 50 mg by mouth.     DERMOTIC 0.01 % OIL PLACE 3 DROPS IN BOTH EARS TWICE PER DAY FOR ONE WEEK THEN ON WED & SAT     diazepam  (VALIUM ) 5 MG tablet Place 1 tablet vaginally nightly as needed for muscle spasm/ pelvic pain. 30 tablet 1   diclofenac Sodium (VOLTAREN) 1 % GEL as needed.     fluticasone  (FLONASE) 50 MCG/ACT nasal spray 1 spray into each nostril daily.     gabapentin (NEURONTIN) 100 MG capsule Take 100 mg by mouth.     HYDROcodone  bit-homatropine (HYCODAN) 5-1.5 MG/5ML syrup Take 5 mLs by mouth every 8 (eight) hours as needed for cough. 120 mL 0   ibuprofen  (ADVIL ) 200 MG tablet Take by mouth  as needed.     lidocaine  (XYLOCAINE ) 2 % solution      loperamide (IMODIUM) 2 MG capsule Take 1 capsule (2 mg) total by mouth once daily as needed for diarrhea.     loratadine (CLARITIN) 10 MG tablet Take by mouth.     LORazepam (ATIVAN) 1 MG tablet Take 1 mg by mouth.     montelukast  (SINGULAIR ) 10 MG tablet Take 1 tablet (10 mg total) by mouth daily. Annual appt due in Sept must see provider for  future refills 90 tablet 0   norethindrone  (ORTHO MICRONOR ) 0.35 MG tablet Take 1 tablet (0.35 mg total) by mouth daily. 28 tablet 11   norethindrone -ethinyl estradiol -iron (LOESTRIN FE) 1.5-30 MG-MCG tablet Take 1 tablet by mouth daily.     nystatin  (MYCOSTATIN /NYSTOP ) powder Apply 1 Application topically 3 (three) times daily. Apply to affected area for up to 7 days 15 g 2   nystatin  ointment (MYCOSTATIN ) Apply 1 Application topically 2 (two) times daily. Apply to affected area for up to 7 days. 30 g 1   ondansetron  (ZOFRAN ) 4 MG tablet Take 1 tablet by mouth up to two times a day as needed for nausea. Second choice.     pilocarpine (SALAGEN) 5 MG tablet Take 5 mg by mouth 3 (three) times daily.     prochlorperazine  (COMPAZINE ) 10 MG tablet Take by mouth.     rizatriptan (MAXALT) 10 MG tablet Take 10 mg by mouth.     tiZANidine (ZANAFLEX) 2 MG tablet Take 2 mg by mouth.     venlafaxine XR (EFFEXOR-XR) 150 MG 24 hr capsule Take 150 mg by mouth daily.     WEGOVY 0.5 MG/0.5ML SOAJ SQ injection SMARTSIG:0.5 Milliliter(s) SUB-Q Once a Week     zonisamide (ZONEGRAN) 25 MG capsule      No current facility-administered medications on file prior to visit.   "

## 2024-08-17 NOTE — Telephone Encounter (Signed)
 FYI Only or Action Required?: FYI only for provider: appointment scheduled on 1/16.  Patient was last seen in primary care on 08/15/2024 by Johnny Garnette LABOR, MD.  Called Nurse Triage reporting Cough.  Symptoms began several weeks ago.  Interventions attempted: Prescription medications: antibiotics.  Symptoms are: gradually worsening.  Triage Disposition: See HCP Within 4 Hours (Or PCP Triage)  Patient/caregiver understands and will follow disposition?: Yes  Reason for Triage: Patient was prescribed antibiotics but her symptoms worsened. She is having worsening body pain (specifically in on her chest, neck, armpit, and groin area. Patient said it is also warm to touch and is very tender in those areas), sinus pressure & headaches, and coughing with red and brown phlem. She also feels like it's hard to breathe and her lymph nodes feels pointy and painful to the touch.    Reason for Disposition  [1] MILD difficulty breathing (e.g., minimal/no SOB at rest, SOB with walking, pulse < 100) AND [2] still present when not coughing  Answer Assessment - Initial Assessment Questions Instructed to use albuterol  as directed   1. ONSET: When did the cough begin?      Few weeks ago 2. SEVERITY: How bad is the cough today?      moderate 3. SPUTUM: Describe the color of your sputum (e.g., none, dry cough; clear, white, yellow, green)     Grown/ red 4. HEMOPTYSIS: Are you coughing up any blood? If Yes, ask: How much? (e.g., flecks, streaks, tablespoons, etc.)     flecks 5. DIFFICULTY BREATHING: Are you having difficulty breathing? If Yes, ask: How bad is it? (e.g., mild, moderate, severe)      SOB with activity 6. FEVER: Do you have a fever? If Yes, ask: What is your temperature, how was it measured, and when did it start?     unknown 7. CARDIAC HISTORY: Do you have any history of heart disease? (e.g., heart attack, congestive heart failure)      no 8. LUNG HISTORY: Do you have  any history of lung disease?  (e.g., pulmonary embolus, asthma, emphysema)     asthma 9. PE RISK FACTORS: Do you have a history of blood clots? (or: recent major surgery, recent prolonged travel, bedridden)      10. OTHER SYMPTOMS: Do you have any other symptoms? (e.g., runny nose, wheezing, chest pain)       Painful lymph nodes around collar bones 11. PREGNANCY: Is there any chance you are pregnant? When was your last menstrual period?        12. TRAVEL: Have you traveled out of the country in the last month? (e.g., travel history, exposures)  Protocols used: Cough - Acute Productive-A-AH

## 2024-08-17 NOTE — Patient Instructions (Addendum)
      Blood work was ordered.       Medications changes include :   None      Return if symptoms worsen or fail to improve.

## 2024-08-18 ENCOUNTER — Ambulatory Visit: Payer: Self-pay | Admitting: Internal Medicine

## 2024-08-21 NOTE — Progress Notes (Signed)
 Cancer Patient Support Program Outpatient Therapy Note   Tue 08/21/2024 Desiree Carlson 1996-05-05 MRN: [id number]  Modality: Person Centered Service Setting: Telehealth Location Information: Patient State (at time of visit): [place]  Patient Location (at time of visit):Home/Other Non-Medical  Provider Location: Hospital/Provider-Based Clinic Is provider licensed to provide clinical care in the current location/state of the patient? Yes   Consent:  Patient's identity was confirmed. Presenting condition or illness was discussed with the patient/personal representative. Current proposed treatment for presenting condition or illness was explained to patient/personal representative along with the likely benefits and any significant risks or complications associated with the provision of treatment by audio/video means. The patient/personal representative verbally authorized treatment to be provided by audio/video, which may include a limited review of patient's current health status, medication, or other treatment recommendations, patient education, and an opportunity to ask questions about condition and treatment. Verbal Consent Granted by Patient/Personal Representative:Yes   Visit Information: Modality: Audio-Only  Time Spent on Phone w/ Patient: 60 min  Medical: Past and present medical history has been reviewed prior to today's visit and can be found on the client's electronic medical record.   Purpose of Contact: Follow up psycho-social and emotional support.    Subjective: Client is unaccompanied during today's visit. Client is a good historian and provides updates on their medical narrative since the previous counseling visit. Client is recovering from a upper respiratory infection the last few weeks. She is hoping to meet with a local outpatient behavioral health treatment facility to determine available services that will provide a higher level of care that she feels she needs  at this time. Client has found the support from such treatment facilities helpful in the past and is being proactive in identifying ways to support herself at this time. She is hopeful to continue meeting for ongoing support through [facility] as it relates to her cancer survivorship journey ongoing and engage in continued art therapy/art psychotherapy. She continues to be hopeful and proactive in her medical journey as she seeks ongoing support from her medical team to gain understanding about her health.      Mental Status:  General Appearance UTA-telephone/video encounter  General Behavior cooperative and pleasant  Psychomotor Activity UTA-telephone/video  Gait and Station UTA-telephone/video encounter  Speech   normal fluency, normal volume, normal tone, and normal rate  Mood   euthymic  Affect    congruent  Thought Process linear, coherent, goal directed  Associations Intact  Thought Content/Perceptual Disturbances denies SI/HI/AVH and symptoms are not consistent with mania or psychosis  Cognition/Sensorium  orientation intact (AAOx4)  Insight  good  Judgment good   Interventions: An empathetic and supportive space is offered for the client to share their medical narrative and explore personal experience. Reflective feedback is provided when appropriate.   Diagnosis:   ICD-10-CM   1. Individual counseling encounter  Z71.89      Outcome and Clinical Impression: Client successfully engages in session, contributes to discussion, and responds favorably to interventions. Client is capable of benefiting from continued counseling.   Follow Up and Recommendations: Client to explore personal development and wellness strategies and continue working on counseling goals between sessions using elements from today's discussions. It is recommended to document personal efforts and progress being made, new ideas, and questions that arise between sessions, and bring those to follow up counseling appointments.    Next appt: 2/12 at 10:30-clinic  Please provide a 24 to 48 hour notification for cancellation and/or reschedule requests. Reach out with  any questions or concerns.   Geni Charolette Baseman  Tue 08/21/2024 2:17 PM   Due to the federally mandated 21st Century Cures Act Info Blocking Rule, all progress notes automatically appear in electronic medical records. Please be aware that anyone with access to your medical record, MyChart app, or [facility] account, including but not limited to your medical providers and family/friends if applicable, may be able to view potentially sensitive information about your physical and mental health.

## 2024-08-22 ENCOUNTER — Encounter (HOSPITAL_COMMUNITY): Payer: Self-pay | Admitting: Internal Medicine

## 2024-08-22 ENCOUNTER — Other Ambulatory Visit: Payer: Self-pay

## 2024-08-22 ENCOUNTER — Emergency Department (HOSPITAL_COMMUNITY)

## 2024-08-22 ENCOUNTER — Inpatient Hospital Stay (HOSPITAL_COMMUNITY)
Admission: EM | Admit: 2024-08-22 | Discharge: 2024-08-28 | DRG: 439 | Disposition: A | Discharge status: Home / Self Care | Attending: Internal Medicine | Admitting: Internal Medicine

## 2024-08-22 ENCOUNTER — Ambulatory Visit: Payer: Self-pay

## 2024-08-22 DIAGNOSIS — F4310 Post-traumatic stress disorder, unspecified: Secondary | ICD-10-CM | POA: Diagnosis present

## 2024-08-22 DIAGNOSIS — Z59868 Other specified financial insecurity: Secondary | ICD-10-CM

## 2024-08-22 DIAGNOSIS — Z8616 Personal history of COVID-19: Secondary | ICD-10-CM

## 2024-08-22 DIAGNOSIS — R5382 Chronic fatigue, unspecified: Secondary | ICD-10-CM | POA: Diagnosis present

## 2024-08-22 DIAGNOSIS — Z87891 Personal history of nicotine dependence: Secondary | ICD-10-CM

## 2024-08-22 DIAGNOSIS — Z91410 Personal history of adult physical and sexual abuse: Secondary | ICD-10-CM

## 2024-08-22 DIAGNOSIS — M797 Fibromyalgia: Secondary | ICD-10-CM | POA: Diagnosis not present

## 2024-08-22 DIAGNOSIS — K8500 Idiopathic acute pancreatitis without necrosis or infection: Secondary | ICD-10-CM

## 2024-08-22 DIAGNOSIS — Z818 Family history of other mental and behavioral disorders: Secondary | ICD-10-CM

## 2024-08-22 DIAGNOSIS — F1090 Alcohol use, unspecified, uncomplicated: Secondary | ICD-10-CM | POA: Diagnosis present

## 2024-08-22 DIAGNOSIS — Z59819 Housing instability, housed unspecified: Secondary | ICD-10-CM

## 2024-08-22 DIAGNOSIS — M25512 Pain in left shoulder: Secondary | ICD-10-CM | POA: Diagnosis present

## 2024-08-22 DIAGNOSIS — Z79899 Other long term (current) drug therapy: Secondary | ICD-10-CM

## 2024-08-22 DIAGNOSIS — R079 Chest pain, unspecified: Secondary | ICD-10-CM | POA: Diagnosis present

## 2024-08-22 DIAGNOSIS — R599 Enlarged lymph nodes, unspecified: Secondary | ICD-10-CM | POA: Diagnosis present

## 2024-08-22 DIAGNOSIS — Z825 Family history of asthma and other chronic lower respiratory diseases: Secondary | ICD-10-CM

## 2024-08-22 DIAGNOSIS — Z83438 Family history of other disorder of lipoprotein metabolism and other lipidemia: Secondary | ICD-10-CM

## 2024-08-22 DIAGNOSIS — F988 Other specified behavioral and emotional disorders with onset usually occurring in childhood and adolescence: Secondary | ICD-10-CM | POA: Diagnosis present

## 2024-08-22 DIAGNOSIS — K589 Irritable bowel syndrome without diarrhea: Secondary | ICD-10-CM | POA: Diagnosis present

## 2024-08-22 DIAGNOSIS — Z831 Family history of other infectious and parasitic diseases: Secondary | ICD-10-CM

## 2024-08-22 DIAGNOSIS — F32A Depression, unspecified: Secondary | ICD-10-CM | POA: Diagnosis present

## 2024-08-22 DIAGNOSIS — B9620 Unspecified Escherichia coli [E. coli] as the cause of diseases classified elsewhere: Secondary | ICD-10-CM | POA: Diagnosis present

## 2024-08-22 DIAGNOSIS — R300 Dysuria: Secondary | ICD-10-CM | POA: Diagnosis present

## 2024-08-22 DIAGNOSIS — Z7951 Long term (current) use of inhaled steroids: Secondary | ICD-10-CM

## 2024-08-22 DIAGNOSIS — G894 Chronic pain syndrome: Secondary | ICD-10-CM | POA: Diagnosis present

## 2024-08-22 DIAGNOSIS — D849 Immunodeficiency, unspecified: Secondary | ICD-10-CM | POA: Diagnosis present

## 2024-08-22 DIAGNOSIS — Z808 Family history of malignant neoplasm of other organs or systems: Secondary | ICD-10-CM

## 2024-08-22 DIAGNOSIS — R7989 Other specified abnormal findings of blood chemistry: Secondary | ICD-10-CM | POA: Diagnosis present

## 2024-08-22 DIAGNOSIS — N809 Endometriosis, unspecified: Secondary | ICD-10-CM | POA: Diagnosis present

## 2024-08-22 DIAGNOSIS — F419 Anxiety disorder, unspecified: Secondary | ICD-10-CM | POA: Diagnosis present

## 2024-08-22 DIAGNOSIS — K760 Fatty (change of) liver, not elsewhere classified: Secondary | ICD-10-CM | POA: Diagnosis present

## 2024-08-22 DIAGNOSIS — Z833 Family history of diabetes mellitus: Secondary | ICD-10-CM

## 2024-08-22 DIAGNOSIS — A0811 Acute gastroenteropathy due to Norwalk agent: Secondary | ICD-10-CM | POA: Diagnosis present

## 2024-08-22 DIAGNOSIS — K852 Alcohol induced acute pancreatitis without necrosis or infection: Principal | ICD-10-CM | POA: Diagnosis present

## 2024-08-22 DIAGNOSIS — Z888 Allergy status to other drugs, medicaments and biological substances status: Secondary | ICD-10-CM

## 2024-08-22 DIAGNOSIS — K859 Acute pancreatitis without necrosis or infection, unspecified: Principal | ICD-10-CM | POA: Diagnosis present

## 2024-08-22 DIAGNOSIS — J4520 Mild intermittent asthma, uncomplicated: Secondary | ICD-10-CM | POA: Diagnosis present

## 2024-08-22 DIAGNOSIS — C9210 Chronic myeloid leukemia, BCR/ABL-positive, not having achieved remission: Secondary | ICD-10-CM | POA: Diagnosis not present

## 2024-08-22 LAB — URINALYSIS, ROUTINE W REFLEX MICROSCOPIC
Bilirubin Urine: NEGATIVE
Glucose, UA: NEGATIVE mg/dL
Hgb urine dipstick: NEGATIVE
Ketones, ur: NEGATIVE mg/dL
Leukocytes,Ua: NEGATIVE
Nitrite: NEGATIVE
Protein, ur: NEGATIVE mg/dL
Specific Gravity, Urine: 1.019 (ref 1.005–1.030)
pH: 6 (ref 5.0–8.0)

## 2024-08-22 LAB — COMPREHENSIVE METABOLIC PANEL WITH GFR
ALT: 18 U/L (ref 0–44)
AST: 22 U/L (ref 15–41)
Albumin: 4.2 g/dL (ref 3.5–5.0)
Alkaline Phosphatase: 115 U/L (ref 38–126)
Anion gap: 12 (ref 5–15)
BUN: 15 mg/dL (ref 6–20)
CO2: 21 mmol/L — ABNORMAL LOW (ref 22–32)
Calcium: 8.8 mg/dL — ABNORMAL LOW (ref 8.9–10.3)
Chloride: 104 mmol/L (ref 98–111)
Creatinine, Ser: 0.66 mg/dL (ref 0.44–1.00)
GFR, Estimated: >60 mL/min
Glucose, Bld: 103 mg/dL — ABNORMAL HIGH (ref 70–99)
Potassium: 3.7 mmol/L (ref 3.5–5.1)
Sodium: 138 mmol/L (ref 135–145)
Total Bilirubin: 0.4 mg/dL (ref 0.0–1.2)
Total Protein: 7.5 g/dL (ref 6.5–8.1)

## 2024-08-22 LAB — CBC
HCT: 39.4 % (ref 36.0–46.0)
Hemoglobin: 13.1 g/dL (ref 12.0–15.0)
MCH: 28.5 pg (ref 26.0–34.0)
MCHC: 33.2 g/dL (ref 30.0–36.0)
MCV: 85.7 fL (ref 80.0–100.0)
Platelets: 316 K/uL (ref 150–400)
RBC: 4.6 MIL/uL (ref 3.87–5.11)
RDW: 13.1 % (ref 11.5–15.5)
WBC: 15.9 K/uL — ABNORMAL HIGH (ref 4.0–10.5)
nRBC: 0 % (ref 0.0–0.2)

## 2024-08-22 LAB — HCG, SERUM, QUALITATIVE: Preg, Serum: NEGATIVE

## 2024-08-22 LAB — LIPASE, BLOOD: Lipase: 27 U/L (ref 11–51)

## 2024-08-22 MED ORDER — DASATINIB 50 MG PO TABS
50.0000 mg | ORAL_TABLET | Freq: Every day | ORAL | Status: DC
  Filled 2024-08-22 (×3): qty 1

## 2024-08-22 MED ORDER — LACTATED RINGERS IV BOLUS
1000.0000 mL | Freq: Once | INTRAVENOUS | Status: AC
  Administered 2024-08-22: 1000 mL via INTRAVENOUS

## 2024-08-22 MED ORDER — MONTELUKAST SODIUM 10 MG PO TABS: 10.0000 mg | ORAL_TABLET | Freq: Every day | ORAL | Status: DC

## 2024-08-22 MED ORDER — LACTATED RINGERS IV BOLUS
2000.0000 mL | Freq: Once | INTRAVENOUS | Status: AC
  Administered 2024-08-22: 2000 mL via INTRAVENOUS

## 2024-08-22 MED ORDER — MORPHINE SULFATE (PF) 4 MG/ML IV SOLN
8.0000 mg | Freq: Once | INTRAVENOUS | Status: AC
  Administered 2024-08-22: 8 mg via INTRAVENOUS
  Filled 2024-08-22: qty 2

## 2024-08-22 MED ORDER — ZONISAMIDE 25 MG PO CAPS
25.0000 mg | ORAL_CAPSULE | Freq: Every day | ORAL | Status: DC
  Filled 2024-08-22 (×2): qty 1

## 2024-08-22 MED ORDER — ACETAMINOPHEN 650 MG RE SUPP: 650.0000 mg | Freq: Four times a day (QID) | RECTAL | Status: DC | PRN

## 2024-08-22 MED ORDER — ONDANSETRON HCL 4 MG PO TABS
4.0000 mg | ORAL_TABLET | Freq: Four times a day (QID) | ORAL | Status: DC | PRN
  Administered 2024-08-26 – 2024-08-27 (×2): 4 mg via ORAL
  Filled 2024-08-22 (×2): qty 1

## 2024-08-22 MED ORDER — ACETAMINOPHEN 325 MG PO TABS
650.0000 mg | ORAL_TABLET | Freq: Four times a day (QID) | ORAL | Status: DC | PRN
  Administered 2024-08-22 – 2024-08-26 (×8): 650 mg via ORAL
  Filled 2024-08-22 (×8): qty 2

## 2024-08-22 MED ORDER — HYDROMORPHONE HCL 1 MG/ML IJ SOLN
0.5000 mg | INTRAMUSCULAR | Status: DC | PRN
  Administered 2024-08-22 – 2024-08-28 (×37): 1 mg via INTRAVENOUS
  Filled 2024-08-22 (×37): qty 1

## 2024-08-22 MED ORDER — ALBUTEROL SULFATE HFA 108 (90 BASE) MCG/ACT IN AERS: 1.0000 | INHALATION_SPRAY | Freq: Four times a day (QID) | RESPIRATORY_TRACT | Status: DC | PRN

## 2024-08-22 MED ORDER — ONDANSETRON HCL 4 MG/2ML IJ SOLN
4.0000 mg | Freq: Four times a day (QID) | INTRAMUSCULAR | Status: DC | PRN
  Administered 2024-08-22 – 2024-08-28 (×12): 4 mg via INTRAVENOUS
  Filled 2024-08-22 (×12): qty 2

## 2024-08-22 MED ORDER — TIZANIDINE HCL 4 MG PO TABS
2.0000 mg | ORAL_TABLET | Freq: Every day | ORAL | Status: DC
  Administered 2024-08-22 – 2024-08-27 (×6): 2 mg via ORAL
  Filled 2024-08-22 (×6): qty 1

## 2024-08-22 MED ORDER — DIAZEPAM 5 MG/ML IJ SOLN
2.5000 mg | Freq: Once | INTRAMUSCULAR | Status: AC
  Administered 2024-08-22: 2.5 mg via INTRAVENOUS
  Filled 2024-08-22: qty 2

## 2024-08-22 MED ORDER — ONDANSETRON HCL 4 MG/2ML IJ SOLN
4.0000 mg | Freq: Once | INTRAMUSCULAR | Status: AC
  Administered 2024-08-22: 4 mg via INTRAVENOUS
  Filled 2024-08-22: qty 2

## 2024-08-22 MED ORDER — AMPHETAMINE-DEXTROAMPHET ER 10 MG PO CP24
30.0000 mg | ORAL_CAPSULE | Freq: Every morning | ORAL | Status: DC
  Filled 2024-08-22: qty 3

## 2024-08-22 MED ORDER — MORPHINE SULFATE (PF) 4 MG/ML IV SOLN
4.0000 mg | INTRAVENOUS | Status: DC | PRN
  Administered 2024-08-22: 4 mg via INTRAVENOUS
  Filled 2024-08-22 (×2): qty 1

## 2024-08-22 MED ORDER — FLUTICASONE FUROATE-VILANTEROL 100-25 MCG/ACT IN AEPB
1.0000 | INHALATION_SPRAY | Freq: Every day | RESPIRATORY_TRACT | Status: DC
  Administered 2024-08-23 – 2024-08-28 (×5): 1 via RESPIRATORY_TRACT
  Filled 2024-08-22: qty 28

## 2024-08-22 MED ORDER — ALPRAZOLAM 0.5 MG PO TABS
0.5000 mg | ORAL_TABLET | Freq: Two times a day (BID) | ORAL | Status: DC | PRN
  Filled 2024-08-22: qty 1

## 2024-08-22 MED ORDER — LACTATED RINGERS IV SOLN: INTRAVENOUS | Status: DC

## 2024-08-22 MED ORDER — ENOXAPARIN SODIUM 40 MG/0.4ML IJ SOSY
40.0000 mg | PREFILLED_SYRINGE | Freq: Every day | INTRAMUSCULAR | Status: DC
  Administered 2024-08-22 – 2024-08-27 (×6): 40 mg via SUBCUTANEOUS
  Filled 2024-08-22 (×6): qty 0.4

## 2024-08-22 MED ORDER — OXYCODONE HCL 5 MG PO TABS
5.0000 mg | ORAL_TABLET | ORAL | Status: DC | PRN
  Administered 2024-08-22 – 2024-08-28 (×15): 5 mg via ORAL
  Filled 2024-08-22 (×15): qty 1

## 2024-08-22 MED ORDER — VENLAFAXINE HCL ER 75 MG PO CP24
150.0000 mg | ORAL_CAPSULE | Freq: Every day | ORAL | Status: DC
  Administered 2024-08-22: 150 mg via ORAL
  Filled 2024-08-22 (×2): qty 2

## 2024-08-22 MED ORDER — KETOROLAC TROMETHAMINE 30 MG/ML IJ SOLN
15.0000 mg | Freq: Once | INTRAMUSCULAR | Status: AC
  Administered 2024-08-22: 15 mg via INTRAVENOUS
  Filled 2024-08-22: qty 1

## 2024-08-22 MED ORDER — VITAMIN B-12 100 MCG PO TABS
100.0000 ug | ORAL_TABLET | Freq: Every day | ORAL | Status: DC
  Administered 2024-08-22 – 2024-08-27 (×6): 100 ug via ORAL
  Filled 2024-08-22 (×7): qty 1

## 2024-08-22 MED ORDER — SODIUM CHLORIDE 0.9 % IV SOLN: INTRAVENOUS | Status: AC

## 2024-08-22 NOTE — ED Notes (Signed)
Called lab and added on lipase

## 2024-08-22 NOTE — Telephone Encounter (Signed)
 FYI Only or Action Required?: FYI only for provider: called 911 for pt.  Patient was last seen in primary care on 08/17/2024 by Geofm Glade PARAS, MD.  Called Nurse Triage reporting Vomiting.  Symptoms began today.  Interventions attempted: Nothing//Unable to drink or eat.  Symptoms are: rapidly worsening.  Triage Disposition: Call EMS 911 Now Stayed on phone with pt until EMS arrived.  Patient/caregiver understands and will follow disposition?: Yes        Message from [facility] sent at 08/22/2024  7:38 AM EST  Summary: Worsening symptoms/vomiting   Reason for Triage: Worsening symptoms. Has been sick for 2 weeks. Saw PCP last week, and symptoms first appeared to be better, but then worsened overnight.  Symptoms: -Increased vomiting -Extreme headache -New lump on forehead -Body aches/Chills -Itchiness         Reason for Disposition  Shock suspected (e.g., cold/pale/clammy skin, too weak to stand, low BP, rapid pulse)  Answer Assessment - Initial Assessment Questions 1. VOMITING SEVERITY: How many times have you vomited in the past 24 hours?      7 times  2. ONSET: When did the vomiting begin?      0400 this am  3. FLUIDS: What fluids or food have you vomited up today? Have you been able to keep any fluids down?     None/no 4. ABDOMEN PAIN: Are your having any abdomen pain? If Yes : How bad is it and what does it feel like? (e.g., crampy, dull, intermittent, constant)      Yes cramping comes and goes rated 8/10. 5. DIARRHEA: Is there any diarrhea? If Yes, ask: How many times today?      Yes /yesterday yellow/stung  6. CONTACTS: Is there anyone else in the family with the same symptoms?      Mother has a cold  7. CAUSE: What do you think is causing your vomiting?     Unknown  8. HYDRATION STATUS: Any signs of dehydration? (e.g., dry mouth [not only dry lips], too weak to stand) When did you last urinate?     Needs assistance to stand and walk,  > than 12 hours 9. OTHER SYMPTOMS: Do you have any other symptoms? (e.g., fever, headache, vertigo, vomiting blood or coffee grounds, recent head injury)     Cold and clammy, nausea, body aches , swollen lump to right forehead nickle sized, foul smelling urine, shivering, hard to breathe, chest feels heavy, neck and collar bone pain, crying and moaning, stuffy nose with blood in it, ears are popping, headache, coughing black,  Protocols used: Vomiting-A-AH

## 2024-08-22 NOTE — Plan of Care (Signed)

## 2024-08-22 NOTE — ED Triage Notes (Signed)
 Pt BIB [facility] for severe vomiting last night with chills and body aches. Painful yellow diarrhea yesterday. Hx leukemia, on oral chemo. Been sick 2 weeks. Reports dysuria w decreased output. Coughing up brown mucus with bloody specks. Swollen/painful lymph nodes in neck. Tested negative for flu/covid multiple times. Had abx twice, stopped second round a week ago.  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  128/74 Hr 68 99% ra

## 2024-08-22 NOTE — ED Provider Notes (Signed)
 " [place] EMERGENCY DEPARTMENT AT [facility] Provider Note   CSN: [id number] Arrival date & time: 08/22/24  9150     Patient presents with: Emesis   Desiree Carlson is a 29 y.o. female.   29 year old female with history of leukemia on oral medications presents with multiple complaints.  Her biggest 1 is that she has had some nausea and vomiting and feels that she is dehydrated.  Gets all of her care over at [facility].  Sees multiple specialist.  Has noted some watery diarrhea yesterday.  States that she has been sick for the last 2 weeks.  Has been seen by her doctor 3 times and has had multiple testing done without an etiology found.  She denies being short of breath.  Has had cough at time of brown mucus.  States when she has emesis, she did see some flecks of blood in it.  She has been on antibiotics recently.  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 Authorizing Provider  acetaminophen  (TYLENOL ) 500 MG tablet Take 1,000 mg by mouth.    [provider]  albuterol  (VENTOLIN  HFA) 108 (90 Base) MCG/ACT inhaler Inhale 1-2 puffs into the lungs every 6 (six) hours as needed for wheezing or shortness of breath. 07/24/24   Geofm Glade PARAS, MD  ALPRAZolam  (XANAX ) 0.5 MG tablet TAKE 1 TABLET BY MOUTH TWICE DAILY AS NEEDED FOR ANXIETY FOR 30 DAYS 03/16/21   [provider]  amphetamine -dextroamphetamine  (ADDERALL XR) 30 MG 24 hr capsule Take 30 mg by mouth every morning. 04/13/24   [provider]  asciminib hcl (SCEMBLIX) 40 MG tablet Take by mouth. 08/17/23   [provider]  Baclofen 5 MG TABS Take 5 mg by mouth. 08/08/24 11/06/24  [provider]  budesonide -formoterol  (SYMBICORT ) 160-4.5 MCG/ACT inhaler Inhale 2 puffs into the lungs 2 (two) times daily as needed. 05/17/23   Geofm Glade PARAS, MD  Calcium  Carb-Cholecalciferol (OYSTER SHELL CALCIUM 250+D) 250-3.125 MG-MCG TABS Take 1 tablet by mouth.    [provider]  cefUROXime  (CEFTIN ) 500 MG tablet Take 1 tablet (500 mg total) by mouth 2 (two) times daily with a meal for 10 days. 08/15/24 08/25/24  Johnny Garnette LABOR, MD  clobetasol  ointment (TEMOVATE ) 0.05 % Apply 1 Application topically 2 (two) times daily. Place in a thin layer of the affected areas twice a day for 2 weeks.  Then place on the affected areas twice a week at bedtime. 04/24/24   Cathlyn JAYSON Nikki Bobie FORBES, MD  cyanocobalamin  100 MCG tablet Take 100 mcg by mouth.    [provider]  dasatinib  (SPRYCEL ) 50 MG tablet Take 50 mg by mouth. 11/14/23 11/13/24  [provider]  DERMOTIC 0.01 % OIL PLACE 3 DROPS IN BOTH EARS TWICE PER DAY FOR ONE WEEK THEN ON WED & SAT    [provider]  diazepam  (VALIUM ) 5 MG tablet Place 1 tablet vaginally nightly as needed for muscle spasm/ pelvic pain. 04/11/23   Zuleta, Kaitlin G, NP  diclofenac Sodium (VOLTAREN) 1 % GEL as needed. 05/11/21   [provider]  fluticasone  (FLONASE) 50 MCG/ACT nasal spray 1 spray into each nostril daily. 04/23/21   [provider]  gabapentin (NEURONTIN) 100 MG capsule Take 100 mg by mouth. 12/20/23 12/19/24  [provider]  HYDROcodone   bit-homatropine (HYCODAN) 5-1.5 MG/5ML syrup Take 5 mLs by mouth every 8 (eight) hours as needed for cough. 08/07/24   Joshua Debby CROME, MD  ibuprofen  (ADVIL ) 200 MG tablet Take by mouth as needed.    [provider]  lidocaine  (XYLOCAINE ) 2 % solution  04/26/21   [provider]  loperamide (IMODIUM) 2 MG capsule Take 1 capsule (2 mg) total by mouth once daily as needed for diarrhea. 04/23/21   [provider]  loratadine (CLARITIN) 10 MG tablet Take by mouth.    [provider]  LORazepam (ATIVAN) 1 MG tablet Take 1 mg by mouth. 04/09/24   [provider]  montelukast  (SINGULAIR ) 10 MG tablet Take 1  tablet (10 mg total) by mouth daily. Annual appt due in Sept must see provider for future refills 01/17/23   Geofm Glade PARAS, MD  norethindrone  (ORTHO MICRONOR ) 0.35 MG tablet Take 1 tablet (0.35 mg total) by mouth daily. 11/16/22   Cathlyn JAYSON Nikki Bobie FORBES, MD  norethindrone -ethinyl estradiol -iron (LOESTRIN FE) 1.5-30 MG-MCG tablet Take 1 tablet by mouth daily. 03/31/23 08/17/24  [provider]  nystatin  (MYCOSTATIN /NYSTOP ) powder Apply 1 Application topically 3 (three) times daily. Apply to affected area for up to 7 days 11/16/22   Amundson C Silva, Brook E, MD  nystatin  ointment (MYCOSTATIN ) Apply 1 Application topically 2 (two) times daily. Apply to affected area for up to 7 days. 11/16/22   Cathlyn JAYSON Nikki Bobie FORBES, MD  ondansetron  (ZOFRAN ) 4 MG tablet Take 1 tablet by mouth up to two times a day as needed for nausea. Second choice. 05/11/21   [provider]  pilocarpine (SALAGEN) 5 MG tablet Take 5 mg by mouth 3 (three) times daily.    [provider]  prochlorperazine  (COMPAZINE ) 10 MG tablet Take by mouth. 04/23/21   [provider]  rizatriptan (MAXALT) 10 MG tablet Take 10 mg by mouth. 03/15/24 03/15/25  [provider]  tiZANidine  (ZANAFLEX ) 2 MG tablet Take 2 mg by mouth. 08/13/24   [provider]  venlafaxine  XR (EFFEXOR -XR) 150 MG 24 hr capsule Take 150 mg by mouth daily. 04/09/24 04/09/25  [provider]  WEGOVY 0.5 MG/0.5ML SOAJ SQ injection SMARTSIG:0.5 Milliliter(s) SUB-Q Once a Week 04/10/24   [provider]  zonisamide  (ZONEGRAN ) 25 MG capsule  10/20/23   [provider]    Allergies: Duloxetine, Bupropion, Cefdinir, and Vortioxetine    Review of Systems  All other systems reviewed and are negative.   Updated Vital Signs BP 134/84   Pulse 74   Temp 98.5 F (36.9 C) (Oral)   Resp 17   LMP 06/20/2024 (Within Weeks)   SpO2 96%   Physical Exam Vitals and nursing note reviewed.  Constitutional:       General: She is not in acute distress.    Appearance: Normal appearance. She is well-developed. She is not toxic-appearing.  HENT:     Head: Normocephalic and atraumatic.  Eyes:     General: Lids are normal.     Conjunctiva/sclera: Conjunctivae normal.     Pupils: Pupils are equal, round, and reactive to light.  Neck:     Thyroid : No thyroid  mass.     Trachea: No tracheal deviation.  Cardiovascular:     Rate and Rhythm: Normal rate and regular rhythm.     Heart sounds: Normal heart sounds. No murmur heard.    No gallop.  Pulmonary:     Effort: Pulmonary effort is normal. No respiratory distress.  Breath sounds: Normal breath sounds. No stridor. No decreased breath sounds, wheezing, rhonchi or rales.  Abdominal:     General: There is no distension.     Palpations: Abdomen is soft.     Tenderness: There is no abdominal tenderness. There is no rebound.  Musculoskeletal:        General: No tenderness. Normal range of motion.     Cervical back: Normal range of motion and neck supple.  Skin:    General: Skin is warm and dry.     Findings: No abrasion or rash.  Neurological:     Mental Status: She is alert and oriented to person, place, and time. Mental status is at baseline.     GCS: GCS eye subscore is 4. GCS verbal subscore is 5. GCS motor subscore is 6.     Cranial Nerves: No cranial nerve deficit.     Sensory: No sensory deficit.     Motor: Motor function is intact.  Psychiatric:        Attention and Perception: Attention normal.        Speech: Speech normal.        Behavior: Behavior normal.     (all labs ordered are listed, but only abnormal results are displayed) Labs Reviewed  CBC - Abnormal; Notable for the following components:      Result Value   WBC 15.9 (*)    All other components within normal limits  COMPREHENSIVE METABOLIC PANEL WITH GFR  URINALYSIS, ROUTINE W REFLEX MICROSCOPIC  HCG, SERUM, QUALITATIVE    EKG: None  Radiology: No results  found.   Procedures   Medications Ordered in the ED  lactated ringers  bolus 2,000 mL (has no administration in time range)  lactated ringers  infusion (has no administration in time range)                                    Medical Decision Making Amount and/or Complexity of Data Reviewed Labs: ordered. Radiology: ordered. ECG/medicine tests: ordered.  Risk Prescription drug management.   Patient treated with multiple rounds of IV pain medications was given IV fluids.  Labs significant for a leukocytosis of 16,000.  Abdominal CT showed pancreatitis.  Her lipase however is 27.  She does have epigastric pain on exam.  Will require admission for observation.  Will consult hospitalist.     Final diagnoses:  None    ED Discharge Orders     None          Dasie Faden, MD 08/22/24 1527  "
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
## 2024-08-22 NOTE — ED Notes (Signed)
 Writer made pt aware a urine sample is needed.

## 2024-08-22 NOTE — ED Notes (Signed)
 Called lab to add on lipase. No answer.

## 2024-08-22 NOTE — Telephone Encounter (Signed)
 Direct call to triage.   S: ER B: CML  A: Desiree Carlson called to let Dr. Cody know she is in the ER at [facility] waiting to be admitted for pancreatitis.   She called in hopes of speaking with someone from the team about current diagnosis and things to avoid.   Call back  [phone] Father, who is with her if she does not answer [phone]

## 2024-08-22 NOTE — Telephone Encounter (Signed)
 Patient is calling to make the provider aware she is getting admitted for pancreatitis. She is wanting the provider to know dur to her Leukemia and to make sure she doesn't need to tell them anything to be aware of.   Please advise.   [phone]

## 2024-08-23 DIAGNOSIS — F4310 Post-traumatic stress disorder, unspecified: Secondary | ICD-10-CM | POA: Diagnosis present

## 2024-08-23 DIAGNOSIS — G894 Chronic pain syndrome: Secondary | ICD-10-CM | POA: Diagnosis present

## 2024-08-23 DIAGNOSIS — R55 Syncope and collapse: Secondary | ICD-10-CM | POA: Diagnosis not present

## 2024-08-23 DIAGNOSIS — K8500 Idiopathic acute pancreatitis without necrosis or infection: Secondary | ICD-10-CM | POA: Diagnosis not present

## 2024-08-23 DIAGNOSIS — R7401 Elevation of levels of liver transaminase levels: Secondary | ICD-10-CM | POA: Diagnosis not present

## 2024-08-23 DIAGNOSIS — Z79899 Other long term (current) drug therapy: Secondary | ICD-10-CM | POA: Diagnosis not present

## 2024-08-23 DIAGNOSIS — Z831 Family history of other infectious and parasitic diseases: Secondary | ICD-10-CM | POA: Diagnosis not present

## 2024-08-23 DIAGNOSIS — B9620 Unspecified Escherichia coli [E. coli] as the cause of diseases classified elsewhere: Secondary | ICD-10-CM | POA: Diagnosis present

## 2024-08-23 DIAGNOSIS — F32A Depression, unspecified: Secondary | ICD-10-CM | POA: Diagnosis present

## 2024-08-23 DIAGNOSIS — K852 Alcohol induced acute pancreatitis without necrosis or infection: Secondary | ICD-10-CM | POA: Diagnosis present

## 2024-08-23 DIAGNOSIS — K859 Acute pancreatitis without necrosis or infection, unspecified: Secondary | ICD-10-CM

## 2024-08-23 DIAGNOSIS — M25551 Pain in right hip: Secondary | ICD-10-CM | POA: Diagnosis not present

## 2024-08-23 DIAGNOSIS — Z87891 Personal history of nicotine dependence: Secondary | ICD-10-CM | POA: Diagnosis not present

## 2024-08-23 DIAGNOSIS — M542 Cervicalgia: Secondary | ICD-10-CM | POA: Diagnosis not present

## 2024-08-23 DIAGNOSIS — F419 Anxiety disorder, unspecified: Secondary | ICD-10-CM | POA: Diagnosis present

## 2024-08-23 DIAGNOSIS — K760 Fatty (change of) liver, not elsewhere classified: Secondary | ICD-10-CM | POA: Diagnosis present

## 2024-08-23 DIAGNOSIS — D849 Immunodeficiency, unspecified: Secondary | ICD-10-CM | POA: Diagnosis present

## 2024-08-23 DIAGNOSIS — J4520 Mild intermittent asthma, uncomplicated: Secondary | ICD-10-CM | POA: Diagnosis present

## 2024-08-23 DIAGNOSIS — S0990XA Unspecified injury of head, initial encounter: Secondary | ICD-10-CM | POA: Diagnosis present

## 2024-08-23 DIAGNOSIS — N809 Endometriosis, unspecified: Secondary | ICD-10-CM | POA: Diagnosis present

## 2024-08-23 DIAGNOSIS — W19XXXA Unspecified fall, initial encounter: Secondary | ICD-10-CM | POA: Diagnosis not present

## 2024-08-23 DIAGNOSIS — Z91410 Personal history of adult physical and sexual abuse: Secondary | ICD-10-CM | POA: Diagnosis not present

## 2024-08-23 DIAGNOSIS — J45909 Unspecified asthma, uncomplicated: Secondary | ICD-10-CM | POA: Diagnosis not present

## 2024-08-23 DIAGNOSIS — A0811 Acute gastroenteropathy due to Norwalk agent: Secondary | ICD-10-CM | POA: Diagnosis present

## 2024-08-23 DIAGNOSIS — Z8616 Personal history of COVID-19: Secondary | ICD-10-CM | POA: Diagnosis not present

## 2024-08-23 DIAGNOSIS — Z59819 Housing instability, housed unspecified: Secondary | ICD-10-CM | POA: Diagnosis not present

## 2024-08-23 DIAGNOSIS — M797 Fibromyalgia: Secondary | ICD-10-CM | POA: Diagnosis present

## 2024-08-23 DIAGNOSIS — R519 Headache, unspecified: Secondary | ICD-10-CM | POA: Diagnosis not present

## 2024-08-23 DIAGNOSIS — C9210 Chronic myeloid leukemia, BCR/ABL-positive, not having achieved remission: Secondary | ICD-10-CM | POA: Diagnosis present

## 2024-08-23 DIAGNOSIS — Z7951 Long term (current) use of inhaled steroids: Secondary | ICD-10-CM | POA: Diagnosis not present

## 2024-08-23 DIAGNOSIS — Z825 Family history of asthma and other chronic lower respiratory diseases: Secondary | ICD-10-CM | POA: Diagnosis not present

## 2024-08-23 DIAGNOSIS — D649 Anemia, unspecified: Secondary | ICD-10-CM | POA: Diagnosis not present

## 2024-08-23 DIAGNOSIS — R0789 Other chest pain: Secondary | ICD-10-CM | POA: Diagnosis not present

## 2024-08-23 DIAGNOSIS — K589 Irritable bowel syndrome without diarrhea: Secondary | ICD-10-CM | POA: Diagnosis present

## 2024-08-23 DIAGNOSIS — Z888 Allergy status to other drugs, medicaments and biological substances status: Secondary | ICD-10-CM | POA: Diagnosis not present

## 2024-08-23 DIAGNOSIS — R7989 Other specified abnormal findings of blood chemistry: Secondary | ICD-10-CM | POA: Diagnosis present

## 2024-08-23 LAB — PHOSPHORUS: Phosphorus: 3 mg/dL (ref 2.5–4.6)

## 2024-08-23 LAB — CBC WITH DIFFERENTIAL/PLATELET
Abs Immature Granulocytes: 0.03 K/uL (ref 0.00–0.07)
Basophils Absolute: 0 K/uL (ref 0.0–0.1)
Basophils Relative: 0 %
Eosinophils Absolute: 0 K/uL (ref 0.0–0.5)
Eosinophils Relative: 0 %
HCT: 37.1 % (ref 36.0–46.0)
Hemoglobin: 11.6 g/dL — ABNORMAL LOW (ref 12.0–15.0)
Immature Granulocytes: 0 %
Lymphocytes Relative: 12 %
Lymphs Abs: 0.8 K/uL (ref 0.7–4.0)
MCH: 28 pg (ref 26.0–34.0)
MCHC: 31.3 g/dL (ref 30.0–36.0)
MCV: 89.6 fL (ref 80.0–100.0)
Monocytes Absolute: 0.6 K/uL (ref 0.1–1.0)
Monocytes Relative: 8 %
Neutro Abs: 5.6 K/uL (ref 1.7–7.7)
Neutrophils Relative %: 80 %
Platelets: 251 K/uL (ref 150–400)
RBC: 4.14 MIL/uL (ref 3.87–5.11)
RDW: 13.1 % (ref 11.5–15.5)
WBC: 7 K/uL (ref 4.0–10.5)
nRBC: 0 % (ref 0.0–0.2)

## 2024-08-23 LAB — COMPREHENSIVE METABOLIC PANEL WITH GFR
ALT: 20 U/L (ref 0–44)
AST: 35 U/L (ref 15–41)
Albumin: 3.5 g/dL (ref 3.5–5.0)
Alkaline Phosphatase: 84 U/L (ref 38–126)
Anion gap: 9 (ref 5–15)
BUN: 10 mg/dL (ref 6–20)
CO2: 24 mmol/L (ref 22–32)
Calcium: 8 mg/dL — ABNORMAL LOW (ref 8.9–10.3)
Chloride: 105 mmol/L (ref 98–111)
Creatinine, Ser: 0.7 mg/dL (ref 0.44–1.00)
GFR, Estimated: >60 mL/min
Glucose, Bld: 89 mg/dL (ref 70–99)
Potassium: 3.4 mmol/L — ABNORMAL LOW (ref 3.5–5.1)
Sodium: 138 mmol/L (ref 135–145)
Total Bilirubin: 0.6 mg/dL (ref 0.0–1.2)
Total Protein: 6.1 g/dL — ABNORMAL LOW (ref 6.5–8.1)

## 2024-08-23 LAB — URINE DRUG SCREEN
Amphetamines: NEGATIVE
Barbiturates: NEGATIVE
Benzodiazepines: NEGATIVE
Cocaine: NEGATIVE
Fentanyl: NEGATIVE
Methadone Scn, Ur: NEGATIVE
Opiates: NEGATIVE
Tetrahydrocannabinol: POSITIVE — AB

## 2024-08-23 LAB — LIPID PANEL
Cholesterol: 153 mg/dL (ref 0–200)
HDL: 33 mg/dL — ABNORMAL LOW
LDL Cholesterol: 91 mg/dL (ref 0–99)
Total CHOL/HDL Ratio: 4.6 ratio
Triglycerides: 145 mg/dL
VLDL: 29 mg/dL (ref 0–40)

## 2024-08-23 LAB — LIPASE, BLOOD: Lipase: 576 U/L — ABNORMAL HIGH (ref 11–51)

## 2024-08-23 LAB — MAGNESIUM: Magnesium: 1.7 mg/dL (ref 1.7–2.4)

## 2024-08-23 MED ORDER — DASATINIB 50 MG PO TABS
50.0000 mg | ORAL_TABLET | ORAL | Status: DC
  Administered 2024-08-23 – 2024-08-27 (×5): 50 mg via ORAL
  Filled 2024-08-23 (×5): qty 1

## 2024-08-23 MED ORDER — DASATINIB 50 MG PO TABS: 50.0000 mg | ORAL_TABLET | Freq: Every day | ORAL | Status: DC

## 2024-08-23 MED ORDER — VENLAFAXINE HCL ER 150 MG PO CP24
150.0000 mg | ORAL_CAPSULE | Freq: Every day | ORAL | Status: DC
  Administered 2024-08-23 – 2024-08-27 (×5): 150 mg via ORAL
  Filled 2024-08-23 (×5): qty 1

## 2024-08-23 MED ORDER — SODIUM CHLORIDE 0.9 % IV SOLN
12.5000 mg | Freq: Four times a day (QID) | INTRAVENOUS | Status: DC | PRN
  Administered 2024-08-23 – 2024-08-27 (×6): 12.5 mg via INTRAVENOUS
  Filled 2024-08-23: qty 12.5
  Filled 2024-08-23: qty 0.5
  Filled 2024-08-23 (×5): qty 12.5

## 2024-08-23 MED ORDER — LACTATED RINGERS IV SOLN: INTRAVENOUS | Status: AC

## 2024-08-23 NOTE — Telephone Encounter (Signed)
 S-Admission/Medication question  B-CML (chronic myelocytic leukemia) (CMD)   A-[facility] pharmacy calling asking if Pt needs to continue oncology medication-Sprycel ?  Pt admitted currently with Acute pancreatitis.    R-Please advise Call back [phone]- [place] or [place]

## 2024-08-23 NOTE — Plan of Care (Signed)
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
## 2024-08-23 NOTE — Progress Notes (Signed)
 PT Cancellation Note  Patient Details Name: MERLYN BOLLEN MRN: [id number] DOB: 10/23/1995   Cancelled Treatment:    Reason Eval/Treat Not Completed: Pain limiting ability to participate. Pt reports she didn't sleep well and has significant pain all over. Pt stated she has been walking to the bathroom when needed without help, but is having pain. Pt requested to defer eval until tomorrow.    Rino Hosea Kerstine 08/23/2024, 10:57 AM

## 2024-08-23 NOTE — Progress Notes (Signed)
 OT Cancellation Note  Patient Details Name: Desiree Carlson MRN: [id number] DOB: 03/04/1996   Cancelled Treatment:    Reason Eval/Treat Not Completed: Fatigue/lethargy limiting ability to participate;Pain limiting ability to participate. Pt reports she didn't sleep well and has pain all over and requested OT return tomorrow  Jacques Karna Loose 08/23/2024, 12:36 PM

## 2024-08-23 NOTE — Progress Notes (Signed)
 " PROGRESS NOTE    Desiree Carlson  FMW:[id number] DOB: [birth year] DOA: 08/22/2024 PCP: Geofm Glade PARAS, MD    Brief Narrative: 29 year old female with a history of leukemia chronic pain syndrome fibromyalgia chronic intermittent asthma history of intracranial hypertension sleep apnea intolerant to CPAP, PTSD mainly followed at [facility] for all the treatments admitted with nausea abdominal pain. 2 to 3 days prior to admission to hospital patient was at a birthday party drinking 5 drinks of alcohol .  She reports her drinking habit as she drinks socially.  She is admitted with abdominal pain nausea and vomiting.  She is found to have acute pancreatitis and is admitted for the same. She is concerned Dasatinib  she takes could be contributing to her pancreatitis.  She has never had an episode of pancreatitis in the past.  She has alternating constipation and diarrhea consistent with IBS. CT abdomen Peripancreatic inflammatory stranding compatible with acute pancreatitis, without acute fluid collection or pseudocyst.Borderline diffuse hepatic steatosis.  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  No signs of bile biliary dilatation or gallstones.  Do not have a lipid panel in our system will check.   Appreciate GI evaluation.  MRCP ordered. Per GI advance diet as tolerated and continue pain control.  Continue IV fluids till she is able to take p.o.  Lipase is actually trending up.  #2 leukemia on Dasatinib  followed at [facility]  #3 asthma stable  #4 history of endometriosis followed at [facility] she has had multiple surgeries for the  same.  #5 history of anxiety depression PTSD she reports she is not taking zonisamide  at home this has been stopped, continue Effexor  though there is a concern if Effexor  could be causing pancreatitis on as needed alprazolam .  #6 history of fibromyalgia and chronic pain continue supportive measures and home meds   Estimated body mass index is 32.02 kg/m as calculated from the following:   Height as of 08/17/24: 5' 9.5 (1.765 m).   Weight as of 08/17/24: 99.8 kg.  DVT prophylaxis: Lovenox   code Status: Full code  family Communication: None  disposition Plan:  Status is: Inpatient   Consultants:  GI  Procedures: None Antimicrobials none  Subjective: Patient complains of dysuria UA on admission was negative for any infection denies any vomiting overnight very concerned about her pancreatitis this is the first time she has had next attack like this.  Concerned if it is caused by the chemotherapy drug she takes.  GI consulted and appreciate their input.  Objective: Vitals:   08/22/24 2013 08/22/24 2043 08/23/24 0041 08/23/24 0500  BP:  120/72 113/73 125/70  Pulse:  86 71 66  Resp:  20 18 18   Temp: 99.8 F (37.7 C) 99.7 F (37.6 C) 98.1 F (36.7 C) (!) 97.5 F (36.4 C)  TempSrc: Oral Oral Oral Oral  SpO2:  95% 97% 99%    Intake/Output Summary (Last 24 hours) at 08/23/2024 1205 Last data filed at 08/23/2024 0600 Gross per 24 hour  Intake 1543.92 ml  Output 600 ml  Net 943.92 ml   There were no vitals filed for this visit.  Examination:  General exam: Appears in nad  Respiratory system: Clear to auscultation. Respiratory effort normal. Cardiovascular system:reg Gastrointestinal system: Abdomen is nondistended, soft and nontender. No organomegaly or masses felt. Normal bowel sounds heard. Central nervous system: Alert and oriented. No focal neurological deficits. Extremities: no edema   Data Reviewed: I have personally reviewed following labs and imaging  studies  CBC: Recent Labs  Lab 08/17/24 1618 08/22/24 0908 08/23/24 0441  WBC 11.1* 15.9* 7.0  NEUTROABS 6.9  --  5.6  HGB 12.7 13.1 11.6*  HCT 38.3 39.4 37.1  MCV 86.0 85.7 89.6  PLT 331.0 316 251   Basic Metabolic Panel: Recent Labs  Lab 08/17/24 1618 08/22/24 0908 08/23/24 0441  NA 139 138 138  K 3.8 3.7 3.4*  CL 101 104 105  CO2 32 21* 24  GLUCOSE 84 103* 89  BUN 9 15 10   CREATININE 0.71 0.66 0.70  CALCIUM 8.9 8.8* 8.0*  MG  --   --  1.7  PHOS  --   --  3.0   GFR: Estimated Creatinine Clearance: 132.9 mL/min (by C-G formula based on SCr of 0.7 mg/dL). Liver Function Tests: Recent Labs  Lab 08/17/24 1618 08/22/24 0908 08/23/24 0441  AST 19 22 35  ALT 22 18 20   ALKPHOS 101 115 84  BILITOT 0.2 0.4 0.6  PROT 7.5 7.5 6.1*  ALBUMIN 4.2 4.2 3.5   Recent Labs  Lab 08/22/24 0908 08/23/24 0441  LIPASE 27 576*   No results for input(s): AMMONIA in the last 168 hours. Coagulation Profile: No results for input(s): INR, PROTIME in the last 168 hours. Cardiac Enzymes: No results for input(s): CKTOTAL, CKMB, CKMBINDEX, TROPONINI in the last 168 hours. BNP (last 3 results) No results for input(s): PROBNP in the last 8760 hours. HbA1C: No results for input(s): HGBA1C in the last 72 hours. CBG: No results for input(s): GLUCAP in the last 168 hours. Lipid Profile: Recent Labs    08/23/24 0441  CHOL 153  HDL 33*  LDLCALC 91  TRIG 854  CHOLHDL 4.6   Thyroid  Function Tests: No results for input(s): TSH, T4TOTAL, FREET4, T3FREE, THYROIDAB in the last 72 hours. Anemia Panel: No results for input(s): VITAMINB12, FOLATE, FERRITIN, TIBC, IRON, RETICCTPCT in the last 72 hours. Sepsis Labs: No results for input(s): PROCALCITON, LATICACIDVEN in the last 168 hours.  No results found for this or any previous visit (from the past 240 hours).       Radiology Studies: CT ABDOMEN PELVIS WO CONTRAST Result Date:  08/22/2024 EXAM: CT ABDOMEN AND PELVIS WITHOUT CONTRAST 08/22/2024 02:03:23 PM TECHNIQUE: CT of the abdomen and pelvis was performed without the administration of intravenous contrast. Multiplanar reformatted images are provided for review. Automated exposure control, iterative reconstruction, and/or weight-based adjustment of the mA/kV was utilized to reduce the radiation dose to as low as reasonably achievable. COMPARISON: 12/14/2022 CLINICAL HISTORY: Abdominal/pelvic pain with vomiting and diarrhea. FINDINGS: LOWER CHEST: No acute abnormality. LIVER: Borderline appearance for diffuse hepatic steatosis. GALLBLADDER AND BILE DUCTS: Gallbladder is unremarkable. No biliary ductal dilatation. SPLEEN: No acute abnormality. PANCREAS: Peripancreatic inflammatory stranding compatible with acute pancreatitis. No acute fluid collection or pseudocyst identified. ADRENAL GLANDS: No acute abnormality. KIDNEYS, URETERS AND BLADDER: No stones in the kidneys or ureters. No hydronephrosis. No perinephric or periureteral stranding. Urinary bladder is unremarkable. GI AND BOWEL: Stomach demonstrates no acute abnormality. There is no bowel obstruction. Normal appendix. PERITONEUM AND RETROPERITONEUM: No ascites. No free air. VASCULATURE: Aorta is normal in caliber. LYMPH NODES: No lymphadenopathy. REPRODUCTIVE ORGANS: No acute abnormality.  BONES AND SOFT TISSUES: No acute osseous abnormality. No focal soft tissue abnormality. IMPRESSION: 1. Peripancreatic inflammatory stranding compatible with acute pancreatitis, without acute fluid collection or pseudocyst. 2. Borderline diffuse hepatic steatosis. Electronically signed by: Ryan Salvage MD 08/22/2024 02:32 PM EST RP Workstation: [id number]        Scheduled Meds:  dasatinib   50 mg Oral Daily   enoxaparin  (LOVENOX ) injection  40 mg Subcutaneous QHS   fluticasone  furoate-vilanterol  1 puff Inhalation Daily   tiZANidine   2 mg Oral QHS   venlafaxine  XR  150 mg Oral QHS    cyanocobalamin   100 mcg Oral Daily   Continuous Infusions:  sodium chloride  125 mL/hr at 08/23/24 0248   promethazine  (PHENERGAN ) injection (IM or IVPB) 12.5 mg (08/23/24 1146)     LOS: 0 days     Desiree KANDICE Hoots, MD  08/23/2024, 12:05 PM   "

## 2024-08-23 NOTE — Telephone Encounter (Addendum)
Called and made Pharmacy aware

## 2024-08-23 NOTE — Plan of Care (Signed)

## 2024-08-24 ENCOUNTER — Inpatient Hospital Stay (HOSPITAL_COMMUNITY)

## 2024-08-24 DIAGNOSIS — K859 Acute pancreatitis without necrosis or infection, unspecified: Secondary | ICD-10-CM | POA: Diagnosis not present

## 2024-08-24 LAB — COMPREHENSIVE METABOLIC PANEL WITH GFR
ALT: 76 U/L — ABNORMAL HIGH (ref 0–44)
AST: 109 U/L — ABNORMAL HIGH (ref 15–41)
Albumin: 3.5 g/dL (ref 3.5–5.0)
Alkaline Phosphatase: 147 U/L — ABNORMAL HIGH (ref 38–126)
Anion gap: 10 (ref 5–15)
BUN: 6 mg/dL (ref 6–20)
CO2: 25 mmol/L (ref 22–32)
Calcium: 8.4 mg/dL — ABNORMAL LOW (ref 8.9–10.3)
Chloride: 102 mmol/L (ref 98–111)
Creatinine, Ser: 0.64 mg/dL (ref 0.44–1.00)
GFR, Estimated: >60 mL/min
Glucose, Bld: 68 mg/dL — ABNORMAL LOW (ref 70–99)
Potassium: 3.7 mmol/L (ref 3.5–5.1)
Sodium: 138 mmol/L (ref 135–145)
Total Bilirubin: 0.3 mg/dL (ref 0.0–1.2)
Total Protein: 6.3 g/dL — ABNORMAL LOW (ref 6.5–8.1)

## 2024-08-24 LAB — CBC
HCT: 34.9 % — ABNORMAL LOW (ref 36.0–46.0)
Hemoglobin: 11.6 g/dL — ABNORMAL LOW (ref 12.0–15.0)
MCH: 28.8 pg (ref 26.0–34.0)
MCHC: 33.2 g/dL (ref 30.0–36.0)
MCV: 86.6 fL (ref 80.0–100.0)
Platelets: 266 K/uL (ref 150–400)
RBC: 4.03 MIL/uL (ref 3.87–5.11)
RDW: 13.2 % (ref 11.5–15.5)
WBC: 9.1 K/uL (ref 4.0–10.5)
nRBC: 0 % (ref 0.0–0.2)

## 2024-08-24 LAB — GLUCOSE, CAPILLARY: Glucose-Capillary: 64 mg/dL — ABNORMAL LOW (ref 70–99)

## 2024-08-24 LAB — LIPASE, BLOOD: Lipase: 141 U/L — ABNORMAL HIGH (ref 11–51)

## 2024-08-24 MED ORDER — ALPRAZOLAM 0.5 MG PO TABS
0.5000 mg | ORAL_TABLET | Freq: Once | ORAL | Status: AC
  Administered 2024-08-24: 0.5 mg via ORAL
  Filled 2024-08-24: qty 1

## 2024-08-24 MED ORDER — LORAZEPAM 2 MG/ML IJ SOLN
1.0000 mg | Freq: Once | INTRAMUSCULAR | Status: DC | PRN
  Filled 2024-08-24: qty 1

## 2024-08-24 MED ORDER — LACTATED RINGERS IV SOLN: INTRAVENOUS | Status: AC

## 2024-08-24 MED ORDER — POLYETHYLENE GLYCOL 3350 17 G PO PACK
17.0000 g | PACK | Freq: Every day | ORAL | Status: DC
  Administered 2024-08-24: 17 g via ORAL
  Filled 2024-08-24: qty 1

## 2024-08-24 MED ORDER — GADOBUTROL 1 MMOL/ML IV SOLN
10.0000 mL | Freq: Once | INTRAVENOUS | Status: AC | PRN
  Administered 2024-08-24: 10 mL via INTRAVENOUS

## 2024-08-24 MED ORDER — POLYETHYLENE GLYCOL 3350 17 G PO PACK: 17.0000 g | PACK | Freq: Every day | ORAL | Status: DC | PRN

## 2024-08-24 NOTE — Evaluation (Signed)
 Occupational Therapy Evaluation/discharge Patient Details Name: Desiree Carlson MRN: [id number] DOB: 1995/10/12 Today's Date: 08/24/2024   History of Present Illness   29 y.o. female with medical history significant of leukemia on Dasatinib , chronic pain syndrome and fibromyalgia, chronic intermittent asthma, history of intracranial hypertension, sleep apnea intolerance to CPAP and multiple other issues presents to the emergency room with about 3 weeks of worsening body ache and myalgia, lymph node enlargement from her chronically lymphadenopathy, treated from PCP office with antibiotics and symptomatic management, negative strep throat and viral respiratory panel started having continuous nausea and vomiting.     Clinical Impressions Patient evaluated by Occupational Therapy with no further acute OT needs identified. All education has been completed and the patient has no further questions. Pt is able to complete ADL's independently or at Mod I level. Currently limited by consistent pain. Pt lives with parents. Currently active with outpatient PT services for pelvic floor pain and for upper trapezius/neck/scapular pain. Discussed currently pain management techniques and to continue to utilize until able to return to PT once discharged. No follow-up Occupational Therapy or equipment needs. OT is signing off. Thank you for this referral.      If plan is discharge home, recommend the following:   Help with stairs or ramp for entrance     Functional Status Assessment   Patient has not had a recent decline in their functional status     Equipment Recommendations   None recommended by OT      Precautions/Restrictions   Precautions Precautions: None Recall of Precautions/Restrictions: Intact Restrictions Weight Bearing Restrictions Per Provider Order: No     Mobility Bed Mobility Overal bed mobility: Modified Independent     Transfers Overall transfer level: Modified  independent Equipment used: None     Balance Overall balance assessment: Mild deficits observed, not formally tested         ADL either performed or assessed with clinical judgement   ADL Overall ADL's : Modified independent;At baseline         Vision Baseline Vision/History: 0 No visual deficits Ability to See in Adequate Light: 0 Adequate Patient Visual Report: No change from baseline Vision Assessment?: No apparent visual deficits     Perception Perception: Not tested       Praxis Praxis: Not tested       Pertinent Vitals/Pain Pain Assessment Pain Assessment: 0-10 Pain Score: 7  Pain Location: abdominal region, groin, low back, upper trapezius/posterior neck Pain Descriptors / Indicators: Aching, Burning, Constant, Sore, Discomfort, Grimacing, Tender Pain Intervention(s): Monitored during session, Limited activity within patient's tolerance     Extremity/Trunk Assessment Upper Extremity Assessment Upper Extremity Assessment: Overall WFL for tasks assessed   Lower Extremity Assessment Lower Extremity Assessment: Defer to PT evaluation   Cervical / Trunk Assessment Cervical / Trunk Assessment: Other exceptions Cervical / Trunk Exceptions: neck and low back pain limiting ROM at times.   Communication Communication Communication: No apparent difficulties   Cognition Arousal: Alert Behavior During Therapy: WFL for tasks assessed/performed Cognition: No apparent impairments     Following commands: Intact       Cueing  General Comments   Cueing Techniques: Verbal cues              Home Living Family/patient expects to be discharged to:: Private residence Living Arrangements: Parent Available Help at Discharge: Family;Available 24 hours/day Type of Home: House Home Access: Stairs to enter [employer] of Steps: 7 Entrance Stairs-Rails: Right;Left Home Layout: Two level Alternate  Level Stairs-Number of Steps: flight Alternate Level  Stairs-Rails: None Bathroom Shower/Tub: Walk-in shower;Tub only;Tub/shower unit   Bathroom Toilet: Standard     Home Equipment: None          Prior Functioning/Environment Prior Level of Function : Independent/Modified Independent;Working/employed    ADLs Comments: freelance work    OT Problem List: Pain;Decreased strength        OT Goals(Current goals can be found in the care plan section)   Acute Rehab OT Goals OT Goal Formulation: All assessment and education complete, DC therapy   OT Frequency:   1 time visit       AM-PAC OT 6 Clicks Daily Activity     Outcome Measure Help from another person eating meals?: None Help from another person taking care of personal grooming?: None Help from another person toileting, which includes using toliet, bedpan, or urinal?: None Help from another person bathing (including washing, rinsing, drying)?: None Help from another person to put on and taking off regular upper body clothing?: None Help from another person to put on and taking off regular lower body clothing?: None 6 Click Score: 24   End of Session    Activity Tolerance: Patient tolerated treatment well;Patient limited by pain Patient left: in bed;with call bell/phone within reach;with family/visitor present  OT Visit Diagnosis: Muscle weakness (generalized) (M62.81);Pain                Time: 1212-1243 OT Time Calculation (min): 31 min Charges:  OT General Charges $OT Visit: 1 Visit OT Evaluation $OT Eval Low Complexity: 1 Low OT Treatments $Self Care/Home Management : 8-22 mins  Leita Howell, OTR/L,CBIS  Supplemental OT - [facility] and [facility] Secure Chat Preferred    Shilpa Bushee, Leita BIRCH 08/24/2024, 1:05 PM

## 2024-08-24 NOTE — Plan of Care (Signed)
" °  Problem: Education: Goal: Knowledge of General Education information will improve Description: Including pain rating scale, medication(s)/side effects and non-pharmacologic comfort measures Outcome: Progressing   Problem: Clinical Measurements: Goal: Ability to maintain clinical measurements within normal limits will improve Outcome: Progressing Goal: Cardiovascular complication will be avoided Outcome: Progressing   Problem: Activity: Goal: Risk for activity intolerance will decrease Outcome: Progressing   Problem: Elimination: Goal: Will not experience complications related to urinary retention Outcome: Progressing   Problem: Safety: Goal: Ability to remain free from injury will improve Outcome: Progressing   Problem: Skin Integrity: Goal: Risk for impaired skin integrity will decrease Outcome: Progressing   "

## 2024-08-24 NOTE — Progress Notes (Signed)
 " PROGRESS NOTE    Desiree Carlson  FMW:[id number] DOB: 04/01/1996 DOA: 08/22/2024 PCP: Geofm Glade PARAS, MD    Brief Narrative: 29 year old female with a history of leukemia chronic pain syndrome fibromyalgia chronic intermittent asthma history of intracranial hypertension sleep apnea intolerant to CPAP, PTSD mainly followed at [facility] for all the treatments admitted with nausea abdominal pain. 2 to 3 days prior to admission to hospital patient was at a birthday party drinking 5 drinks of alcohol .  She reports her drinking habit as she drinks socially.  She is admitted with abdominal pain nausea and vomiting.  She is found to have acute pancreatitis and is admitted for the same. She is concerned Dasatinib  she takes could be contributing to her pancreatitis.  She has never had an episode of pancreatitis in the past.  She has alternating constipation and diarrhea consistent with IBS. CT abdomen Peripancreatic inflammatory stranding compatible with acute pancreatitis, without acute fluid collection or pseudocyst.Borderline diffuse hepatic steatosis.  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  No signs of bile biliary dilatation or gallstones.   Lipid panel LDL 128 triglycerides 88.   Lipase trending down.  Encouraged her to eat and started her on a regular diet.   MRCP shows improved pancreatitis no gallstones or biliary ductal dilatation mild hepatic steatosis.  #2 leukemia on Dasatinib  followed at [facility]  #3 asthma stable  #4 history of endometriosis followed at [facility] she has had multiple surgeries for the same.  #5 history of  anxiety depression PTSD she reports she is not taking zonisamide  at home this has been stopped, continue Effexor  though there is a concern if Effexor  could be causing pancreatitis on as needed alprazolam .  #6 history of fibromyalgia and chronic pain continue supportive measures and home meds   Estimated body mass index is 32.02 kg/m as calculated from the following:   Height as of 08/17/24: 5' 9.5 (1.765 m).   Weight as of 08/17/24: 99.8 kg.  DVT prophylaxis: Lovenox   code Status: Full code  family Communication: None  disposition Plan:  Status is: Inpatient   Consultants:  GI  Procedures: None Antimicrobials none  Subjective:  Patient had complaints of left shoulder pain left-sided chest producible chest pain that shoulder pain has been chronic in nature.  Followed [facility] at [facility].  X-ray of the left shoulder no acute findings scapula x-rays pending will advance her diet today encouraged her to ambulate and walk around the hall if she does well plan for discharge tomorrow Objective: Vitals:   08/23/24 1951 08/24/24 0050 08/24/24 0412 08/24/24 1151  BP: 125/64 118/69 119/66 101/83  Pulse: 73 70 73 80  Resp: 18 18 16 16   Temp: 98.5 F (36.9 C) 98.2 F (36.8 C) 98.6 F (37 C) 98.4 F (36.9 C)  TempSrc: Oral Oral Oral Oral  SpO2: 97% 100% 99% 99%    Intake/Output Summary (Last 24 hours) at 08/24/2024 1325 Last data filed at 08/24/2024 9281 Gross per 24 hour  Intake 1345.74 ml  Output 1800 ml  Net -454.26 ml   There were no vitals filed for this visit.  Examination:  General exam: Appears in nad  Respiratory system: Clear to auscultation. Respiratory effort normal. Cardiovascular system:reg Gastrointestinal system: Abdomen is nondistended, soft and nontender. No organomegaly or masses felt. Normal bowel sounds heard. Central nervous system: Alert and oriented. No focal neurological deficits. Extremities: no edema   Data Reviewed: I have personally reviewed  following labs and imaging studies  CBC: Recent Labs  Lab 08/17/24 1618 08/22/24 0908 08/23/24 0441 08/24/24 1045  WBC 11.1* 15.9* 7.0 9.1  NEUTROABS 6.9  --  5.6  --   HGB 12.7 13.1 11.6* 11.6*  HCT 38.3 39.4 37.1 34.9*  MCV 86.0 85.7 89.6 86.6  PLT 331.0 316 251 266   Basic Metabolic Panel: Recent Labs  Lab 08/17/24 1618 08/22/24 0908 08/23/24 0441 08/24/24 1045  NA 139 138 138 138  K 3.8 3.7 3.4* 3.7  CL 101 104 105 102  CO2 32 21* 24 25  GLUCOSE 84 103* 89 68*  BUN 9 15 10 6   CREATININE 0.71 0.66 0.70 0.64  CALCIUM  8.9 8.8* 8.0* 8.4*  MG  --   --  1.7  --   PHOS  --   --  3.0  --    GFR: Estimated Creatinine Clearance: 132.9 mL/min (by C-G formula based on SCr of 0.64 mg/dL). Liver Function Tests: Recent Labs  Lab 08/17/24 1618 08/22/24 0908 08/23/24 0441 08/24/24 1045  AST 19 22 35 109*  ALT 22 18 20  76*  ALKPHOS 101 115 84 147*  BILITOT 0.2 0.4 0.6 0.3  PROT 7.5 7.5 6.1* 6.3*  ALBUMIN 4.2 4.2 3.5 3.5   Recent Labs  Lab 08/22/24 0908 08/23/24 0441 08/24/24 1045  LIPASE 27 576* 141*   No results for input(s): AMMONIA in the last 168 hours. Coagulation Profile: No results for input(s): INR, PROTIME in the last 168 hours. Cardiac Enzymes: No results for input(s): CKTOTAL, CKMB, CKMBINDEX, TROPONINI in the last 168 hours. BNP (last 3 results) No results for input(s): PROBNP in the last 8760 hours. HbA1C: No results for input(s): HGBA1C in the last 72 hours. CBG: No results for input(s): GLUCAP in the last 168 hours. Lipid Profile: Recent Labs    08/23/24 0441  CHOL 153  HDL 33*  LDLCALC 91  TRIG 854  CHOLHDL 4.6   Thyroid  Function Tests: No results for input(s): TSH, T4TOTAL, FREET4, T3FREE, THYROIDAB in the last 72 hours. Anemia Panel: No results for input(s): VITAMINB12, FOLATE, FERRITIN, TIBC, IRON, RETICCTPCT in the last 72 hours. Sepsis Labs: No results for input(s): PROCALCITON,  LATICACIDVEN in the last 168 hours.  No results found for this or any previous visit (from the past 240 hours).       Radiology Studies: DG Shoulder Left Result Date: 08/24/2024 EXAM: 1 VIEW(S) XRAY OF THE LEFT SHOULDER 08/24/2024 09:35:00 AM COMPARISON: None available. CLINICAL HISTORY: Pain. FINDINGS: BONES AND JOINTS: Glenohumeral joint is normally aligned. No acute fracture. No malalignment. The [facility] joint is unremarkable. SOFT TISSUES: No abnormal calcifications. Visualized lung is unremarkable. IMPRESSION: 1. No acute findings. Electronically signed by: Donnice Mania MD 08/24/2024 01:22 PM EST RP Workstation: [id number]   MR ABDOMEN MRCP W WO CONTRAST Result Date: 08/24/2024 CLINICAL DATA:  Acute pancreatitis EXAM: MRI ABDOMEN WITHOUT AND WITH CONTRAST (INCLUDING MRCP) TECHNIQUE: Multiplanar multisequence MR imaging of the abdomen was performed both before and after the administration of intravenous contrast. Heavily T2-weighted images of the biliary and pancreatic ducts were obtained, and three-dimensional MRCP images were rendered by post processing. CONTRAST:  10mL GADAVIST  GADOBUTROL  1 MMOL/ML IV SOLN COMPARISON:  CT  08/22/2024 FINDINGS: The MRCP images are mildly motion degraded. Lower chest: Normal heart size without pericardial or pleural effusion. Hepatobiliary: Mild hepatic steatosis. No suspicious liver lesion. Normal gallbladder. No intra or extrahepatic biliary duct dilatation. The common duct measures 2-3 mm on image 20/3. No choledocholithiasis. Pancreas: Markedly improved peripancreatic edema. Minimal thickening of the anterior left pararenal fascia remains on image 23/4. No areas of pancreatic necrosis. No duct dilatation. No peripancreatic fluid collection. No pancreas divisum. Spleen:  Normal in size, without focal abnormality. Adrenals/Urinary Tract: Normal adrenal glands. Normal kidneys, without hydronephrosis. Stomach/Bowel: Normal stomach and abdominal bowel loops.  Vascular/Lymphatic: Normal caliber of the aorta and branch vessels. No retroperitoneal or retrocrural adenopathy. Other:  No ascites. Musculoskeletal: No acute osseous abnormality. IMPRESSION: Markedly improved, nearly resolved pancreatitis. No cholelithiasis, biliary duct dilatation, or choledocholithiasis. Mild hepatic steatosis. Electronically Signed   By: Rockey Kilts M.D.   On: 08/24/2024 08:53   MR 3D Recon At Scanner Result Date: 08/24/2024 CLINICAL DATA:  Acute pancreatitis EXAM: MRI ABDOMEN WITHOUT AND WITH CONTRAST (INCLUDING MRCP) TECHNIQUE: Multiplanar multisequence MR imaging of the abdomen was performed both before and after the administration of intravenous contrast. Heavily T2-weighted images of the biliary and pancreatic ducts were obtained, and three-dimensional MRCP images were rendered by post processing. CONTRAST:  10mL GADAVIST  GADOBUTROL  1 MMOL/ML IV SOLN COMPARISON:  CT 08/22/2024 FINDINGS: The MRCP images are mildly motion degraded. Lower chest: Normal heart size without pericardial or pleural effusion. Hepatobiliary: Mild hepatic steatosis. No suspicious liver lesion. Normal gallbladder. No intra or extrahepatic biliary duct dilatation. The common duct measures 2-3 mm on image 20/3. No choledocholithiasis. Pancreas: Markedly improved peripancreatic edema. Minimal thickening of the anterior left pararenal fascia remains on image 23/4. No areas of pancreatic necrosis. No duct dilatation. No peripancreatic fluid collection. No pancreas divisum. Spleen:  Normal in size, without focal abnormality. Adrenals/Urinary Tract: Normal adrenal glands. Normal kidneys, without hydronephrosis. Stomach/Bowel: Normal stomach and abdominal bowel loops. Vascular/Lymphatic: Normal caliber of the aorta and branch vessels. No retroperitoneal or retrocrural adenopathy. Other:  No ascites. Musculoskeletal: No acute osseous abnormality. IMPRESSION: Markedly improved, nearly resolved pancreatitis. No  cholelithiasis, biliary duct dilatation, or choledocholithiasis. Mild hepatic steatosis. Electronically Signed   By: Rockey Kilts M.D.   On: 08/24/2024 08:53   CT ABDOMEN PELVIS WO CONTRAST Result Date: 08/22/2024 EXAM: CT ABDOMEN AND PELVIS WITHOUT CONTRAST 08/22/2024 02:03:23 PM TECHNIQUE: CT of the abdomen and pelvis was performed without the administration of intravenous contrast. Multiplanar reformatted images are provided for review. Automated exposure control, iterative reconstruction, and/or weight-based adjustment of the mA/kV was utilized to reduce the radiation dose to as low as reasonably achievable. COMPARISON: 12/14/2022 CLINICAL HISTORY: Abdominal/pelvic pain with vomiting and diarrhea. FINDINGS: LOWER CHEST: No acute abnormality. LIVER: Borderline appearance for diffuse hepatic steatosis. GALLBLADDER AND BILE DUCTS: Gallbladder is unremarkable. No biliary ductal dilatation. SPLEEN: No acute abnormality. PANCREAS: Peripancreatic inflammatory stranding compatible with acute pancreatitis. No acute fluid collection or pseudocyst identified. ADRENAL GLANDS: No acute abnormality. KIDNEYS, URETERS AND BLADDER: No stones in the kidneys or ureters. No hydronephrosis. No perinephric or periureteral stranding. Urinary bladder is unremarkable. GI AND BOWEL: Stomach demonstrates no acute abnormality. There is no bowel obstruction. Normal appendix. PERITONEUM AND RETROPERITONEUM: No ascites. No free air. VASCULATURE: Aorta is normal in caliber. LYMPH NODES: No lymphadenopathy. REPRODUCTIVE ORGANS: No acute abnormality. BONES AND SOFT TISSUES: No acute osseous abnormality. No focal soft tissue abnormality. IMPRESSION: 1. Peripancreatic inflammatory stranding compatible with acute pancreatitis, without acute fluid collection or  pseudocyst. 2. Borderline diffuse hepatic steatosis. Electronically signed by: Ryan Salvage MD 08/22/2024 02:32 PM EST RP Workstation: [id number]        Scheduled Meds:   dasatinib   50 mg Oral Q24H   enoxaparin  (LOVENOX ) injection  40 mg Subcutaneous QHS   fluticasone  furoate-vilanterol  1 puff Inhalation Daily   polyethylene glycol  17 g Oral Daily   tiZANidine   2 mg Oral QHS   venlafaxine  XR  150 mg Oral QHS   cyanocobalamin   100 mcg Oral Daily   Continuous Infusions:  lactated ringers  100 mL/hr at 08/24/24 0342   promethazine  (PHENERGAN ) injection (IM or IVPB) 150 mL/hr at 08/24/24 0342     LOS: 1 day     Desiree KANDICE Hoots, MD  08/24/2024, 1:25 PM   "

## 2024-08-24 NOTE — Evaluation (Addendum)
 Physical Therapy Evaluation Patient Details Name: Desiree Carlson MRN: [id number] DOB: 1995-08-12 Today's Date: 08/24/2024  History of Present Illness  29 y.o. female with medical history significant of leukemia on Dasatinib , chronic pain syndrome and fibromyalgia, chronic intermittent asthma, history of intracranial hypertension, sleep apnea intolerance to CPAP and multiple other issues presents to the emergency room with about 3 weeks of worsening body ache and myalgia, lymph node enlargement from her chronically lymphadenopathy, treated from PCP office with antibiotics and symptomatic management, negative strep throat and viral respiratory panel started having continuous nausea and vomiting.  Clinical Impression  Patient ambulating from bathroom upon arrival. She just was given pain medication and verbalized 7/10 pain that increased to 8/10 pain with ambulation. Patient able to perform all bed mobility independently and demonstrated good balance performing a standing march. She was able to ambulate 112ft with CGA. She occasionally using the railing in the hallway for support. Educated patient on the possible benefit of using walking poles or a straight cane with bad pain flares or when she feels unsteady. She verbalized understanding and has been thinking about incorporating an AD. Patient would benefit from continued skilled services to trial walking with a cane and stair negotiation. Patient is active in pelvic floor PT and would benefit from outpatient therapy to incorporate dry needling.      If plan is discharge home, recommend the following: A little help with walking and/or transfers;A little help with bathing/dressing/bathroom   Can travel by private vehicle        Equipment Recommendations [place] [employer] or walking poles)  Recommendations for Other Services       Functional Status Assessment Patient has had a recent decline in their functional status and demonstrates the ability to make  significant improvements in function in a reasonable and predictable amount of time.     Precautions / Restrictions Precautions Precautions: None Recall of Precautions/Restrictions: Intact Restrictions Weight Bearing Restrictions Per Provider Order: No      Mobility  Bed Mobility Overal bed mobility: Modified Independent                  Transfers Overall transfer level: Modified independent Equipment used: None                    Ambulation/Gait Ambulation/Gait assistance: Contact guard assist Gait Distance (Feet): 180 Feet   Gait Pattern/deviations: Decreased step length - left, Decreased stance time - right     Pre-gait activities: Standing march x3 B General Gait Details: Decreased cadence; at times used railing in hallway for support; decresed step length; pain increased from a 7 to an 8 with ambulation  Stairs            Wheelchair Mobility     Tilt Bed    Modified Rankin (Stroke Patients Only)       Balance Overall balance assessment: Mild deficits observed, not formally tested, History of Falls (History of falls due to lightheadedness and changes in cancer medication)                                           Pertinent Vitals/Pain Pain Assessment Pain Assessment: 0-10 Pain Score: 7  (7-8/10) Pain Location: abdominals, left scapula, under breast bone Pain Intervention(s): Limited activity within patient's tolerance, Monitored during session, Premedicated before session    Home Living Family/patient expects to be discharged  to:: Private residence Living Arrangements: Parent Available Help at Discharge: Family;Available 24 hours/day Type of Home: House Home Access: Stairs to enter Entrance Stairs-Rails: Doctor, General Practice of Steps: 7 with a railing Alternate Level Stairs-Number of Steps: flight 20+ steps Home Layout: Two level Home Equipment: None Additional Comments: There is a guest room on  the main floor of house    Prior Function Prior Level of Function : Independent/Modified Independent;Working/employed               ADLs Comments: freelance work     Radio Producer Extremity Assessment Upper Extremity Assessment: Defer to OT evaluation    Lower Extremity Assessment Lower Extremity Assessment: Overall WFL for tasks assessed (Patient able to complete sit to stand SBA)    Cervical / Trunk Assessment Cervical / Trunk Assessment: Other exceptions Cervical / Trunk Exceptions: neck and low back pain limiting ROM at times.  Communication   Communication Communication: No apparent difficulties    Cognition Arousal: Alert Behavior During Therapy: WFL for tasks assessed/performed                             Following commands: Intact       Cueing Cueing Techniques: Verbal cues     General Comments      Exercises     Assessment/Plan    PT Assessment Patient needs continued PT services (Patient would benefit from a follow up session to use a cane with ambulation and possible stair negotiation)  PT Problem List Decreased strength;Decreased activity tolerance;Decreased balance       PT Treatment Interventions DME instruction;Stair training    PT Goals (Current goals can be found in the Care Plan section)       Frequency Other (Comment) (one follow up session)     Co-evaluation               AM-PAC PT 6 Clicks Mobility  Outcome Measure Help needed turning from your back to your side while in a flat bed without using bedrails?: None Help needed moving from lying on your back to sitting on the side of a flat bed without using bedrails?: None Help needed moving to and from a bed to a chair (including a wheelchair)?: None Help needed standing up from a chair using your arms (e.g., wheelchair or bedside chair)?: None Help needed to walk in hospital room?: A Little Help needed climbing 3-5 steps with a railing?  : A Little 6 Click Score: 22    End of Session Equipment Utilized During Treatment: Gait belt Activity Tolerance: Patient limited by pain;Patient tolerated treatment well Patient left: in bed;with call bell/phone within reach   PT Visit Diagnosis: Unsteadiness on feet (R26.81);Other abnormalities of gait and mobility (R26.89);Muscle weakness (generalized) (M62.81)    Time: 8576-8556 PT Time Calculation (min) (ACUTE ONLY): 20 min   Charges:   PT Evaluation $PT Eval Moderate Complexity: 1 Mod   PT General Charges $$ ACUTE PT VISIT: 1 Visit         Kristeen Sar, PT, DPT 08/24/24 3:30 PM   Kristeen Sar 08/24/2024, 3:30 PM
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
## 2024-08-25 ENCOUNTER — Inpatient Hospital Stay (HOSPITAL_COMMUNITY)

## 2024-08-25 DIAGNOSIS — K859 Acute pancreatitis without necrosis or infection, unspecified: Secondary | ICD-10-CM | POA: Diagnosis not present

## 2024-08-25 LAB — CBC
HCT: 35 % — ABNORMAL LOW (ref 36.0–46.0)
Hemoglobin: 11.3 g/dL — ABNORMAL LOW (ref 12.0–15.0)
MCH: 28.5 pg (ref 26.0–34.0)
MCHC: 32.3 g/dL (ref 30.0–36.0)
MCV: 88.2 fL (ref 80.0–100.0)
Platelets: 218 10*3/uL (ref 150–400)
RBC: 3.97 MIL/uL (ref 3.87–5.11)
RDW: 13.2 % (ref 11.5–15.5)
WBC: 12 10*3/uL — ABNORMAL HIGH (ref 4.0–10.5)
nRBC: 0 % (ref 0.0–0.2)

## 2024-08-25 LAB — COMPREHENSIVE METABOLIC PANEL WITH GFR
ALT: 103 U/L — ABNORMAL HIGH (ref 0–44)
AST: 164 U/L — ABNORMAL HIGH (ref 15–41)
Albumin: 3.6 g/dL (ref 3.5–5.0)
Alkaline Phosphatase: 187 U/L — ABNORMAL HIGH (ref 38–126)
Anion gap: 8 (ref 5–15)
BUN: <5 mg/dL — ABNORMAL LOW (ref 6–20)
CO2: 28 mmol/L (ref 22–32)
Calcium: 8.7 mg/dL — ABNORMAL LOW (ref 8.9–10.3)
Chloride: 104 mmol/L (ref 98–111)
Creatinine, Ser: 0.64 mg/dL (ref 0.44–1.00)
GFR, Estimated: >60 mL/min
Glucose, Bld: 98 mg/dL (ref 70–99)
Potassium: 3.5 mmol/L (ref 3.5–5.1)
Sodium: 140 mmol/L (ref 135–145)
Total Bilirubin: 0.5 mg/dL (ref 0.0–1.2)
Total Protein: 6.7 g/dL (ref 6.5–8.1)

## 2024-08-25 LAB — GLUCOSE, CAPILLARY: Glucose-Capillary: 94 mg/dL (ref 70–99)

## 2024-08-25 LAB — LIPASE, BLOOD: Lipase: 161 U/L — ABNORMAL HIGH (ref 11–51)

## 2024-08-25 MED ORDER — CALCIUM CARBONATE ANTACID 500 MG PO CHEW
1.0000 | CHEWABLE_TABLET | Freq: Two times a day (BID) | ORAL | Status: DC
  Administered 2024-08-25 – 2024-08-27 (×5): 200 mg via ORAL
  Filled 2024-08-25 (×6): qty 1

## 2024-08-25 MED ORDER — SIMETHICONE 80 MG PO CHEW
80.0000 mg | CHEWABLE_TABLET | Freq: Four times a day (QID) | ORAL | Status: DC | PRN
  Administered 2024-08-25: 80 mg via ORAL
  Filled 2024-08-25: qty 1

## 2024-08-25 MED ORDER — PANTOPRAZOLE SODIUM 40 MG IV SOLR: 40.0000 mg | INTRAVENOUS | Status: DC

## 2024-08-25 NOTE — Plan of Care (Signed)
  Problem: Education: Goal: Knowledge of General Education information will improve Description: Including pain rating scale, medication(s)/side effects and non-pharmacologic comfort measures Outcome: Progressing   Problem: Clinical Measurements: Goal: Ability to maintain clinical measurements within normal limits will improve Outcome: Progressing Goal: Will remain free from infection Outcome: Progressing Goal: Diagnostic test results will improve Outcome: Progressing Goal: Respiratory complications will improve Outcome: Progressing Goal: Cardiovascular complication will be avoided Outcome: Progressing   Problem: Activity: Goal: Risk for activity intolerance will decrease Outcome: Progressing   Problem: Nutrition: Goal: Adequate nutrition will be maintained Outcome: Progressing   Problem: Coping: Goal: Level of anxiety will decrease Outcome: Progressing   Problem: Elimination: Goal: Will not experience complications related to bowel motility Outcome: Progressing Goal: Will not experience complications related to urinary retention Outcome: Progressing   Problem: Safety: Goal: Ability to remain free from injury will improve Outcome: Progressing   Problem: Skin Integrity: Goal: Risk for impaired skin integrity will decrease Outcome: Progressing   

## 2024-08-25 NOTE — Plan of Care (Signed)

## 2024-08-25 NOTE — Progress Notes (Signed)
 Physical Therapy Treatment Patient Details Name: Desiree Carlson MRN: [id number] DOB: 03/21/1996 Today's Date: 08/25/2024   History of Present Illness 29 y.o. female with medical history significant of leukemia on Dasatinib , chronic pain syndrome and fibromyalgia, chronic intermittent asthma, history of intracranial hypertension, sleep apnea intolerance to CPAP and multiple other issues presents to the emergency room with about 3 weeks of worsening body ache and myalgia, lymph node enlargement from her chronically lymphadenopathy, treated from PCP office with antibiotics and symptomatic management, negative strep throat and viral respiratory panel started having continuous nausea and vomiting.    PT Comments  Pt agreeable to working with therapy. She reports ongoing pain throughout body, rated about 6/10 on today after pain meds. She ambulated ~175 feet in the hallway-intermittent unsteadiness due to pain. She tolerated distance fairly well. Discussed possible benefit of using a cane PRN (could also try a walking stick/ski pole). Encouraged increased ambulation as tolerated, with family or nursing. Continue to recommend OPPT f/u.     If plan is discharge home, recommend the following: A little help with walking and/or transfers;A little help with bathing/dressing/bathroom   Can travel by private [employer] (straight cane with wider base or walking pole (ski pole))    Recommendations for Other Services       Precautions / Restrictions Precautions Precautions: None Recall of Precautions/Restrictions: Intact Restrictions Weight Bearing Restrictions Per Provider Order: No     Mobility  Bed Mobility Overal bed mobility: Modified Independent                  Transfers Overall transfer level: Modified independent                      Ambulation/Gait Ambulation/Gait assistance: Contact guard assist Gait Distance (Feet): 175  Feet Assistive device: IV Pole (vs hallway handrail) Gait Pattern/deviations: Decreased stride length       General Gait Details: Decreased cadence; at times used railing in hallway for support, in addition to IV pole; decresed step length; tolerated distance fairly well. 1-2 brief stops due to pain   Stairs             Wheelchair Mobility     Tilt Bed    Modified Rankin (Stroke Patients Only)       Balance Overall balance assessment: Mild deficits observed, not formally tested, History of Falls                                          Communication Communication Communication: No apparent difficulties  Cognition Arousal: Alert Behavior During Therapy: WFL for tasks assessed/performed                             Following commands: Intact      Cueing Cueing Techniques: Verbal cues  Exercises      General Comments        Pertinent Vitals/Pain Pain Assessment Pain Assessment: Faces Pain Score: 6  Pain Location: abdominals, left scapula, under breast bone Pain Descriptors / Indicators: Aching, Burning, Constant, Sore, Discomfort, Grimacing, Tender Pain Intervention(s): Monitored during session, Limited activity within patient's tolerance    Home Living  Prior Function            PT Goals (current goals can now be found in the care plan section) Progress towards PT goals: Progressing toward goals    Frequency    Min 3X/week      PT Plan      Co-evaluation              AM-PAC PT 6 Clicks Mobility   Outcome Measure  Help needed turning from your back to your side while in a flat bed without using bedrails?: None Help needed moving from lying on your back to sitting on the side of a flat bed without using bedrails?: None Help needed moving to and from a bed to a chair (including a wheelchair)?: None Help needed standing up from a chair using your arms (e.g., wheelchair  or bedside chair)?: None Help needed to walk in hospital room?: A Little Help needed climbing 3-5 steps with a railing? : A Little 6 Click Score: 22    End of Session Equipment Utilized During Treatment: Gait belt Activity Tolerance: Patient tolerated treatment well Patient left: in bed;with call bell/phone within reach;with family/visitor present (pt then assisted herself to bathroom)   PT Visit Diagnosis: Unsteadiness on feet (R26.81);Other abnormalities of gait and mobility (R26.89);Muscle weakness (generalized) (M62.81)     Time: 8846-8779 PT Time Calculation (min) (ACUTE ONLY): 27 min  Charges:    $Gait Training: 8-22 mins PT General Charges $$ ACUTE PT VISIT: 1 Visit                         Dannial SQUIBB, PT Acute Rehabilitation  Office: [phone]

## 2024-08-25 NOTE — Progress Notes (Signed)
 [facility] Progress Note  Desiree Carlson 29 y.o. 03-22-1996   Subjective: Reports 10 episodes of diarrhea yesterday. History of rectal bleeding that she thinks is from endometriosis and reports colonoscopy at [facility] 2-3 years ago. Shows me countless pictures of her stool with blood in it. Flowsheets documents only one stool on 1/23 which was watery. Complains of abdominal pain. Has drawn multiple circles on her abdomen that she says correspond to nodules she is feeling. Mother in room.  Objective: Vital signs: Vitals:   08/24/24 2349 08/25/24 0415  BP: 127/84 111/60  Pulse:  68  Resp:  17  Temp:  98.5 F (36.9 C)  SpO2:  99%    Physical Exam: Gen: alert, no acute distress, well-nourished  HEENT: anicteric sclera CV: RRR Chest: CTA B Abd: diffuse tenderness (worse in upper quadrants) with guarding, soft, nondistended, +BS Ext: no edema  Lab Results: Recent Labs    08/23/24 0441 08/24/24 1045 08/25/24 0417  NA 138 138 140  K 3.4* 3.7 3.5  CL 105 102 104  CO2 24 25 28   GLUCOSE 89 68* 98  BUN 10 6 <5*  CREATININE 0.70 0.64 0.64  CALCIUM  8.0* 8.4* 8.7*  MG 1.7  --   --   PHOS 3.0  --   --    Recent Labs    08/24/24 1045 08/25/24 0417  AST 109* 164*  ALT 76* 103*  ALKPHOS 147* 187*  BILITOT 0.3 0.5  PROT 6.3* 6.7  ALBUMIN 3.5 3.6   Recent Labs    08/23/24 0441 08/24/24 1045 08/25/24 0417  WBC 7.0 9.1 12.0*  NEUTROABS 5.6  --   --   HGB 11.6* 11.6* 11.3*  HCT 37.1 34.9* 35.0*  MCV 89.6 86.6 88.2  PLT 251 266 218      Assessment/Plan: Acute pancreatitis that is idiopathic - mild rise in LFTs with normal bilirubin. I do not think additional imaging is needed at this time. Follow LFTs. Diarrhea - check GI pathogen panel and C. Diff. Close f/u with [facility]. Diet - continue clear liquid diet. Await stool studies. [facility] will revisit on Monday.   [name] Sol 08/25/2024, 9:56 AM  Questions please call [phone] ID:  Desiree Carlson, female   DOB: 16-Dec-1995, 29 y.o.   MRN: [id number]

## 2024-08-25 NOTE — Progress Notes (Signed)
 " PROGRESS NOTE    Desiree Carlson  FMW:[id number] DOB: Jun 09, 1996 DOA: 08/22/2024 PCP: Geofm Glade PARAS, MD    Brief Narrative: 29 year old female with a history of leukemia chronic pain syndrome fibromyalgia chronic intermittent asthma history of intracranial hypertension sleep apnea intolerant to CPAP, PTSD mainly followed at [facility] for all the treatments admitted with nausea abdominal pain. 2 to 3 days prior to admission to hospital patient was at a birthday party drinking 5 drinks of alcohol .  She reports her drinking habit as she drinks socially.  She is admitted with abdominal pain nausea and vomiting.  She is found to have acute pancreatitis and is admitted for the same. She is concerned Dasatinib  she takes could be contributing to her pancreatitis.  She has never had an episode of pancreatitis in the past.  She has alternating constipation and diarrhea consistent with IBS. CT abdomen Peripancreatic inflammatory stranding compatible with acute pancreatitis, without acute fluid collection or pseudocyst.Borderline diffuse hepatic steatosis.  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  No signs of bile biliary dilatation or gallstones.   Lipid panel LDL 128 triglycerides 88.   Lipase trending down.  Encouraged her to eat and started her on a regular diet.   MRCP shows improved pancreatitis no gallstones or biliary ductal dilatation mild hepatic steatosis.  #2 leukemia on Dasatinib  followed at [facility]  #3 asthma stable  #4 history of endometriosis followed at [facility] she has had multiple surgeries for the same.  #5 history of  anxiety depression PTSD she reports she is not taking zonisamide  at home this has been stopped, continue Effexor  though there is a concern if Effexor  could be causing pancreatitis on as needed alprazolam .  #6 history of fibromyalgia and chronic pain continue supportive measures and home meds  #7 abnormal elevated LFTs- LFTs have been slowly trending up, acute hepatitis panel sent, right upper quadrant ultrasound pending  #8 diarrhea had 10 episodes of loose BM last night will order stool C. difficile as well as GI pathogen panel and continue IV fluids  Estimated body mass index is 32.02 kg/m as calculated from the following:   Height as of 08/17/24: 5' 9.5 (1.765 m).   Weight as of 08/17/24: 99.8 kg.  DVT prophylaxis: Lovenox   code Status: Full code  family Communication: None  disposition Plan:  Status is: Inpatient   Consultants:  GI  Procedures: None Antimicrobials none  Subjective: She reports having a very bad night she had 10 loose BMs in few hours overnight associated with nausea and abdominal pain more in the right upper quadrant and she reports her defecation as painful bowel movements.  Her labs from today indicate low BUN of 5 her p.o. intake has been so minimal, LFTs trending up, lipase trended up compared to yesterday white count is trending up She was not able to tolerate any p.o. intake last evening she threw up what ever she ate  Objective: Vitals:   08/24/24 1932 08/24/24 2117 08/24/24 2349 08/25/24 0415  BP: 122/75 123/76 127/84 111/60  Pulse: 83   68  Resp: 19   17  Temp: 98.1 F (36.7 C)   98.5 F (  36.9 C)  TempSrc:    Oral  SpO2: 100%   99%    Intake/Output Summary (Last 24 hours) at 08/25/2024 9076 Last data filed at 08/25/2024 0313 Gross per 24 hour  Intake 947.21 ml  Output 1200 ml  Net -252.79 ml   There were no vitals filed for this visit.  Examination:  General exam: Appears in nad Respiratory system: Clear to auscultation. Respiratory  effort normal. Cardiovascular system:reg Gastrointestinal system: Abdomen is nondistended, soft and right upper quadrant tenderness no organomegaly or masses felt. Normal bowel sounds heard. Central nervous system: Alert and oriented. No focal neurological deficits. Extremities: no edema   Data Reviewed: I have personally reviewed following labs and imaging studies  CBC: Recent Labs  Lab 08/22/24 0908 08/23/24 0441 08/24/24 1045 08/25/24 0417  WBC 15.9* 7.0 9.1 12.0*  NEUTROABS  --  5.6  --   --   HGB 13.1 11.6* 11.6* 11.3*  HCT 39.4 37.1 34.9* 35.0*  MCV 85.7 89.6 86.6 88.2  PLT 316 251 266 218   Basic Metabolic Panel: Recent Labs  Lab 08/22/24 0908 08/23/24 0441 08/24/24 1045 08/25/24 0417  NA 138 138 138 140  K 3.7 3.4* 3.7 3.5  CL 104 105 102 104  CO2 21* 24 25 28   GLUCOSE 103* 89 68* 98  BUN 15 10 6  <5*  CREATININE 0.66 0.70 0.64 0.64  CALCIUM  8.8* 8.0* 8.4* 8.7*  MG  --  1.7  --   --   PHOS  --  3.0  --   --    GFR: Estimated Creatinine Clearance: 132.9 mL/min (by C-G formula based on SCr of 0.64 mg/dL). Liver Function Tests: Recent Labs  Lab 08/22/24 0908 08/23/24 0441 08/24/24 1045 08/25/24 0417  AST 22 35 109* 164*  ALT 18 20 76* 103*  ALKPHOS 115 84 147* 187*  BILITOT 0.4 0.6 0.3 0.5  PROT 7.5 6.1* 6.3* 6.7  ALBUMIN 4.2 3.5 3.5 3.6   Recent Labs  Lab 08/22/24 0908 08/23/24 0441 08/24/24 1045 08/25/24 0417  LIPASE 27 576* 141* 161*   No results for input(s): AMMONIA in the last 168 hours. Coagulation Profile: No results for input(s): INR, PROTIME in the last 168 hours. Cardiac Enzymes: No results for input(s): CKTOTAL, CKMB, CKMBINDEX, TROPONINI in the last 168 hours. BNP (last 3 results) No results for input(s): PROBNP in the last 8760 hours. HbA1C: No results for input(s): HGBA1C in the last 72 hours. CBG: Recent Labs  Lab 08/24/24 1751 08/25/24 0000  GLUCAP 64* 94   Lipid Profile: Recent Labs     08/23/24 0441  CHOL 153  HDL 33*  LDLCALC 91  TRIG 854  CHOLHDL 4.6   Thyroid  Function Tests: No results for input(s): TSH, T4TOTAL, FREET4, T3FREE, THYROIDAB in the last 72 hours. Anemia Panel: No results for input(s): VITAMINB12, FOLATE, FERRITIN, TIBC, IRON, RETICCTPCT in the last 72 hours. Sepsis Labs: No results for input(s): PROCALCITON, LATICACIDVEN in the last 168 hours.  No results found for this or any previous visit (from the past 240 hours).       Radiology Studies: DG Scapula Left Result Date: 08/24/2024 EXAM: 1 VIEW(S) XRAY OF THE LEFT SCAPULA 08/24/2024 09:35:00 AM COMPARISON: None available. CLINICAL HISTORY: Pain. FINDINGS: BONES AND JOINTS: Curved acromial undersurface. No fracture or acute bony finding is readily apparent. No malalignment at the glenohumeral joint or acromioclavicular joint. SOFT TISSUES: Unremarkable. IMPRESSION: 1. No acute osseous abnormality. Electronically signed by: Ryan Salvage MD 08/24/2024 01:31 PM EST RP  Workstation: [id number]   DG Shoulder Left Result Date: 08/24/2024 EXAM: 1 VIEW(S) XRAY OF THE LEFT SHOULDER 08/24/2024 09:35:00 AM COMPARISON: None available. CLINICAL HISTORY: Pain. FINDINGS: BONES AND JOINTS: Glenohumeral joint is normally aligned. No acute fracture. No malalignment. The [facility] joint is unremarkable. SOFT TISSUES: No abnormal calcifications. Visualized lung is unremarkable. IMPRESSION: 1. No acute findings. Electronically signed by: Donnice Mania MD 08/24/2024 01:22 PM EST RP Workstation: [id number]   MR ABDOMEN MRCP W WO CONTRAST Result Date: 08/24/2024 CLINICAL DATA:  Acute pancreatitis EXAM: MRI ABDOMEN WITHOUT AND WITH CONTRAST (INCLUDING MRCP) TECHNIQUE: Multiplanar multisequence MR imaging of the abdomen was performed both before and after the administration of intravenous contrast. Heavily T2-weighted images of the biliary and pancreatic ducts were obtained, and three-dimensional MRCP  images were rendered by post processing. CONTRAST:  10mL GADAVIST  GADOBUTROL  1 MMOL/ML IV SOLN COMPARISON:  CT 08/22/2024 FINDINGS: The MRCP images are mildly motion degraded. Lower chest: Normal heart size without pericardial or pleural effusion. Hepatobiliary: Mild hepatic steatosis. No suspicious liver lesion. Normal gallbladder. No intra or extrahepatic biliary duct dilatation. The common duct measures 2-3 mm on image 20/3. No choledocholithiasis. Pancreas: Markedly improved peripancreatic edema. Minimal thickening of the anterior left pararenal fascia remains on image 23/4. No areas of pancreatic necrosis. No duct dilatation. No peripancreatic fluid collection. No pancreas divisum. Spleen:  Normal in size, without focal abnormality. Adrenals/Urinary Tract: Normal adrenal glands. Normal kidneys, without hydronephrosis. Stomach/Bowel: Normal stomach and abdominal bowel loops. Vascular/Lymphatic: Normal caliber of the aorta and branch vessels. No retroperitoneal or retrocrural adenopathy. Other:  No ascites. Musculoskeletal: No acute osseous abnormality. IMPRESSION: Markedly improved, nearly resolved pancreatitis. No cholelithiasis, biliary duct dilatation, or choledocholithiasis. Mild hepatic steatosis. Electronically Signed   By: Rockey Kilts M.D.   On: 08/24/2024 08:53   MR 3D Recon At Scanner Result Date: 08/24/2024 CLINICAL DATA:  Acute pancreatitis EXAM: MRI ABDOMEN WITHOUT AND WITH CONTRAST (INCLUDING MRCP) TECHNIQUE: Multiplanar multisequence MR imaging of the abdomen was performed both before and after the administration of intravenous contrast. Heavily T2-weighted images of the biliary and pancreatic ducts were obtained, and three-dimensional MRCP images were rendered by post processing. CONTRAST:  10mL GADAVIST  GADOBUTROL  1 MMOL/ML IV SOLN COMPARISON:  CT 08/22/2024 FINDINGS: The MRCP images are mildly motion degraded. Lower chest: Normal heart size without pericardial or pleural effusion.  Hepatobiliary: Mild hepatic steatosis. No suspicious liver lesion. Normal gallbladder. No intra or extrahepatic biliary duct dilatation. The common duct measures 2-3 mm on image 20/3. No choledocholithiasis. Pancreas: Markedly improved peripancreatic edema. Minimal thickening of the anterior left pararenal fascia remains on image 23/4. No areas of pancreatic necrosis. No duct dilatation. No peripancreatic fluid collection. No pancreas divisum. Spleen:  Normal in size, without focal abnormality. Adrenals/Urinary Tract: Normal adrenal glands. Normal kidneys, without hydronephrosis. Stomach/Bowel: Normal stomach and abdominal bowel loops. Vascular/Lymphatic: Normal caliber of the aorta and branch vessels. No retroperitoneal or retrocrural adenopathy. Other:  No ascites. Musculoskeletal: No acute osseous abnormality. IMPRESSION: Markedly improved, nearly resolved pancreatitis. No cholelithiasis, biliary duct dilatation, or choledocholithiasis. Mild hepatic steatosis. Electronically Signed   By: Rockey Kilts M.D.   On: 08/24/2024 08:53        Scheduled Meds:  calcium  carbonate  1 tablet Oral BID WC   dasatinib   50 mg Oral Q24H   enoxaparin  (LOVENOX ) injection  40 mg Subcutaneous QHS   fluticasone  furoate-vilanterol  1 puff Inhalation Daily   tiZANidine   2 mg Oral QHS   venlafaxine  XR  150 mg Oral QHS  cyanocobalamin   100 mcg Oral Daily   Continuous Infusions:  lactated ringers  100 mL/hr at 08/25/24 9362   promethazine  (PHENERGAN ) injection (IM or IVPB) 12.5 mg (08/25/24 0832)     LOS: 2 days     Almarie KANDICE Hoots, MD  08/25/2024, 9:23 AM   "

## 2024-08-26 DIAGNOSIS — K8500 Idiopathic acute pancreatitis without necrosis or infection: Secondary | ICD-10-CM | POA: Diagnosis not present

## 2024-08-26 DIAGNOSIS — K859 Acute pancreatitis without necrosis or infection, unspecified: Secondary | ICD-10-CM | POA: Diagnosis not present

## 2024-08-26 LAB — COMPREHENSIVE METABOLIC PANEL WITH GFR
ALT: 122 U/L — ABNORMAL HIGH (ref 0–44)
AST: 145 U/L — ABNORMAL HIGH (ref 15–41)
Albumin: 3.6 g/dL (ref 3.5–5.0)
Alkaline Phosphatase: 202 U/L — ABNORMAL HIGH (ref 38–126)
Anion gap: 8 (ref 5–15)
BUN: <5 mg/dL — ABNORMAL LOW (ref 6–20)
CO2: 27 mmol/L (ref 22–32)
Calcium: 8.5 mg/dL — ABNORMAL LOW (ref 8.9–10.3)
Chloride: 104 mmol/L (ref 98–111)
Creatinine, Ser: 0.72 mg/dL (ref 0.44–1.00)
GFR, Estimated: >60 mL/min
Glucose, Bld: 77 mg/dL (ref 70–99)
Potassium: 3.5 mmol/L (ref 3.5–5.1)
Sodium: 140 mmol/L (ref 135–145)
Total Bilirubin: 0.3 mg/dL (ref 0.0–1.2)
Total Protein: 6.6 g/dL (ref 6.5–8.1)

## 2024-08-26 LAB — C DIFFICILE (CDIFF) QUICK SCRN (NO PCR REFLEX)
C Diff antigen: POSITIVE — AB
C Diff toxin: NEGATIVE

## 2024-08-26 LAB — GASTROINTESTINAL PANEL BY PCR, STOOL (REPLACES STOOL CULTURE)
Adenovirus F40/41: NOT DETECTED
Astrovirus: NOT DETECTED
Campylobacter species: NOT DETECTED
Cryptosporidium: NOT DETECTED
Cyclospora cayetanensis: NOT DETECTED
Entamoeba histolytica: NOT DETECTED
Enteroaggregative E coli (EAEC): DETECTED — AB
Enteropathogenic E coli (EPEC): NOT DETECTED
Enterotoxigenic E coli (ETEC): NOT DETECTED
Giardia lamblia: NOT DETECTED
Norovirus GI/GII: DETECTED — AB
Plesimonas shigelloides: NOT DETECTED
Rotavirus A: NOT DETECTED
Salmonella species: NOT DETECTED
Sapovirus (I, II, IV, and V): NOT DETECTED
Shiga like toxin producing E coli (STEC): NOT DETECTED
Shigella/Enteroinvasive E coli (EIEC): NOT DETECTED
Vibrio cholerae: NOT DETECTED
Vibrio species: NOT DETECTED
Yersinia enterocolitica: NOT DETECTED

## 2024-08-26 LAB — CBC
HCT: 35.7 % — ABNORMAL LOW (ref 36.0–46.0)
Hemoglobin: 11.6 g/dL — ABNORMAL LOW (ref 12.0–15.0)
MCH: 28.2 pg (ref 26.0–34.0)
MCHC: 32.5 g/dL (ref 30.0–36.0)
MCV: 86.9 fL (ref 80.0–100.0)
Platelets: 250 10*3/uL (ref 150–400)
RBC: 4.11 MIL/uL (ref 3.87–5.11)
RDW: 13.2 % (ref 11.5–15.5)
WBC: 9.3 10*3/uL (ref 4.0–10.5)
nRBC: 0 % (ref 0.0–0.2)

## 2024-08-26 MED ORDER — FIDAXOMICIN 200 MG PO TABS
200.0000 mg | ORAL_TABLET | Freq: Two times a day (BID) | ORAL | Status: DC
  Administered 2024-08-26 (×2): 200 mg via ORAL
  Filled 2024-08-26 (×3): qty 1

## 2024-08-26 MED ORDER — SODIUM CHLORIDE 0.9 % IV SOLN: INTRAVENOUS | Status: AC

## 2024-08-26 NOTE — Progress Notes (Signed)
 Received call from [facility] at [facility]; stated that results of patient's GI panel were positive for norovirus and enteroaggregated e-coli; MD notified by secure chat; will continue to monitor

## 2024-08-26 NOTE — Progress Notes (Signed)
 " PROGRESS NOTE    Desiree Carlson  FMW:[id number] DOB: 01/08/1996 DOA: 08/22/2024 PCP: Geofm Glade PARAS, MD    Brief Narrative: 29 year old female with a history of leukemia chronic pain syndrome fibromyalgia chronic intermittent asthma history of intracranial hypertension sleep apnea intolerant to CPAP, PTSD mainly followed at [facility] for all the treatments admitted with nausea abdominal pain. 2 to 3 days prior to admission to hospital patient was at a birthday party drinking 5 drinks of alcohol .  She reports her drinking habit as she drinks socially.  She is admitted with abdominal pain nausea and vomiting.  She is found to have acute pancreatitis and is admitted for the same. She is concerned Dasatinib  she takes could be contributing to her pancreatitis.  She has never had an episode of pancreatitis in the past.  She has alternating constipation and diarrhea consistent with IBS. CT abdomen Peripancreatic inflammatory stranding compatible with acute pancreatitis, without acute fluid collection or pseudocyst.Borderline diffuse hepatic steatosis.  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  No signs of bile biliary dilatation or gallstones.   Lipid panel LDL 128 triglycerides 88.   Lipase trending down.  Encouraged her to eat and started her on a regular diet.  But she was unable to tolerate that and he vomited and had 8 bouts of diarrhea overnight again.  C. difficile antigen positive toxin negative rest of the GI panel is pending.  With ongoing abdominal pain diarrhea mild leukocytosis nausea in the setting of immunocompromised leukemia on oral  chemo will consider starting patient on fidaxomicin  200 mg twice daily for 10 days. MRCP shows improved pancreatitis no gallstones or biliary ductal dilatation mild hepatic steatosis.  #2 leukemia on Dasatinib  followed at [facility]  #3 asthma stable  #4 history of endometriosis followed at [facility] she has had multiple surgeries for the same.  #5 history of anxiety depression PTSD she reports she is not taking zonisamide  at home this has been stopped, continue Effexor  though there is a concern if Effexor  could be causing pancreatitis on as needed alprazolam .  #6 history of fibromyalgia and chronic pain continue supportive measures and home meds  #7 abnormal elevated LFTs- LFTs have been slowly trending up, acute hepatitis panel sent, abdominal ultrasound with no evidence of stones or acute findings.    #8 diarrhea still with 8 episodes of diarrhea overnight not taking anything p.o.  C. difficile antigen positive toxin negative mild leukocytosis GI pathogen panel pending.  Will discuss with ID.   Estimated body mass index is 32.02 kg/m as calculated from the following:   Height as of 08/17/24: 5' 9.5 (1.765 m).   Weight as of 08/17/24: 99.8 kg.  DVT prophylaxis: Lovenox   code Status: Full code  family Communication: None  disposition Plan:  Status is: Inpatient   Consultants:  GI  Procedures: None Antimicrobials none  Subjective:  she continues to have diarrhea approximately 8 times last night she had saved it for me to see it associated with nausea no vomiting  Objective: Vitals:   08/25/24 1307 08/25/24 2022 08/26/24 0646 08/26/24 0753  BP: 120/66 119/68 106/64   Pulse: 71 83 73  Resp: 16 20    Temp: 98 F (36.7 C) 98 F (36.7 C) 97.7 F (36.5 C)   TempSrc: Oral Oral Oral   SpO2: 98% 98% 98% 97%    Intake/Output Summary (Last 24 hours) at 08/26/2024 1118 Last data filed at 08/25/2024 1800 Gross per 24 hour  Intake 960 ml  Output --  Net 960 ml   There were  no vitals filed for this visit.  Examination:  General exam: Appears in nad Respiratory system: Clear to auscultation. Respiratory effort normal. Cardiovascular system:reg Gastrointestinal system: Abdomen is nondistended, soft and right upper quadrant tenderness no organomegaly or masses felt. Normal bowel sounds heard. Central nervous system: Alert and oriented. No focal neurological deficits. Extremities: no edema   Data Reviewed: I have personally reviewed following labs and imaging studies  CBC: Recent Labs  Lab 08/22/24 0908 08/23/24 0441 08/24/24 1045 08/25/24 0417 08/26/24 0420  WBC 15.9* 7.0 9.1 12.0* 9.3  NEUTROABS  --  5.6  --   --   --   HGB 13.1 11.6* 11.6* 11.3* 11.6*  HCT 39.4 37.1 34.9* 35.0* 35.7*  MCV 85.7 89.6 86.6 88.2 86.9  PLT 316 251 266 218 250   Basic Metabolic Panel: Recent Labs  Lab 08/22/24 0908 08/23/24 0441 08/24/24 1045 08/25/24 0417 08/26/24 0420  NA 138 138 138 140 140  K 3.7 3.4* 3.7 3.5 3.5  CL 104 105 102 104 104  CO2 21* 24 25 28 27   GLUCOSE 103* 89 68* 98 77  BUN 15 10 6  <5* <5*  CREATININE 0.66 0.70 0.64 0.64 0.72  CALCIUM  8.8* 8.0* 8.4* 8.7* 8.5*  MG  --  1.7  --   --   --   PHOS  --  3.0  --   --   --    GFR: Estimated Creatinine Clearance: 132.9 mL/min (by C-G formula based on SCr of 0.72 mg/dL). Liver Function Tests: Recent Labs  Lab 08/22/24 0908 08/23/24 0441 08/24/24 1045 08/25/24 0417 08/26/24 0420  AST 22 35 109* 164* 145*  ALT 18 20 76* 103* 122*  ALKPHOS 115 84 147* 187* 202*  BILITOT 0.4 0.6 0.3 0.5 0.3  PROT 7.5 6.1* 6.3* 6.7 6.6  ALBUMIN 4.2 3.5 3.5 3.6 3.6   Recent Labs  Lab 08/22/24 0908 08/23/24 0441 08/24/24 1045 08/25/24 0417  LIPASE 27 576* 141* 161*   No results for input(s): AMMONIA in the last 168 hours. Coagulation Profile: No results for input(s): INR, PROTIME in the last 168 hours. Cardiac Enzymes: No results for input(s): CKTOTAL, CKMB, CKMBINDEX, TROPONINI in  the last 168 hours. BNP (last 3 results) No results for input(s): PROBNP in the last 8760 hours. HbA1C: No results for input(s): HGBA1C in the last 72 hours. CBG: Recent Labs  Lab 08/24/24 1751 08/25/24 0000  GLUCAP 64* 94   Lipid Profile: No results for input(s): CHOL, HDL, LDLCALC, TRIG, CHOLHDL, LDLDIRECT in the last 72 hours.  Thyroid  Function Tests: No results for input(s): TSH, T4TOTAL, FREET4, T3FREE, THYROIDAB in the last 72 hours. Anemia Panel: No results for input(s): VITAMINB12, FOLATE, FERRITIN, TIBC, IRON, RETICCTPCT in the last 72 hours. Sepsis Labs: No results for input(s): PROCALCITON, LATICACIDVEN in the last 168 hours.  Recent Results (from the past 240 hours)  C Difficile Quick Screen (NO PCR Reflex)     Status: Abnormal   Collection Time: 08/25/24  1:35 AM   Specimen: Stool  Result Value Ref Range Status   C Diff antigen POSITIVE (A) NEGATIVE  Final   C Diff toxin NEGATIVE NEGATIVE Final   C Diff interpretation   Final    Results are indeterminate. Please contact the provider listed for your campus for C diff questions in AMION.    Comment: Performed at [facility], [address] [place], [place] [address]         Radiology Studies: US  Abdomen Complete Result Date: 08/25/2024 CLINICAL DATA:  Intractable nausea and vomiting. Acute pancreatitis. EXAM: ABDOMEN ULTRASOUND COMPLETE COMPARISON:  08/22/2024. FINDINGS: Gallbladder: No gallstones or wall thickening visualized. No sonographic Murphy sign noted by sonographer. Common bile duct: Diameter: 3.3 mm Liver: No focal lesion identified. The liver is borderline enlarged. Increased parenchymal echogenicity. Portal vein is patent on color Doppler imaging with normal direction of blood flow towards the liver. IVC: No abnormality visualized. Pancreas: Not well seen due to overlying bowel gas. Spleen: Spleen is borderline enlarged at 12.5 cm. Right  Kidney: Length: 13.2 cm. Echogenicity within normal limits. No mass or hydronephrosis visualized. Left Kidney: Length: 12.2 cm. Echogenicity within normal limits. No mass or hydronephrosis visualized. Abdominal aorta: No aneurysm visualized. Other findings: No free fluid. IMPRESSION: 1. Pancreas is not well seen due to overlying bowel gas. 2. Hepatic steatosis. Electronically Signed   By: Leita Birmingham M.D.   On: 08/25/2024 17:44        Scheduled Meds:  calcium  carbonate  1 tablet Oral BID WC   dasatinib   50 mg Oral Q24H   enoxaparin  (LOVENOX ) injection  40 mg Subcutaneous QHS   fluticasone  furoate-vilanterol  1 puff Inhalation Daily   tiZANidine   2 mg Oral QHS   venlafaxine  XR  150 mg Oral QHS   cyanocobalamin   100 mcg Oral Daily   Continuous Infusions:  sodium chloride      promethazine  (PHENERGAN ) injection (IM or IVPB) 12.5 mg (08/25/24 0832)     LOS: 3 days     Almarie KANDICE Hoots, MD  08/26/2024, 11:18 AM   "

## 2024-08-26 NOTE — Plan of Care (Signed)

## 2024-08-27 DIAGNOSIS — K852 Alcohol induced acute pancreatitis without necrosis or infection: Secondary | ICD-10-CM

## 2024-08-27 DIAGNOSIS — K859 Acute pancreatitis without necrosis or infection, unspecified: Secondary | ICD-10-CM | POA: Diagnosis not present

## 2024-08-27 DIAGNOSIS — C9210 Chronic myeloid leukemia, BCR/ABL-positive, not having achieved remission: Secondary | ICD-10-CM | POA: Diagnosis not present

## 2024-08-27 DIAGNOSIS — K8500 Idiopathic acute pancreatitis without necrosis or infection: Secondary | ICD-10-CM | POA: Diagnosis not present

## 2024-08-27 LAB — DIFFERENTIAL
Abs Immature Granulocytes: 0.02 10*3/uL (ref 0.00–0.07)
Basophils Absolute: 0.1 10*3/uL (ref 0.0–0.1)
Basophils Relative: 1 %
Eosinophils Absolute: 0.2 10*3/uL (ref 0.0–0.5)
Eosinophils Relative: 3 %
Immature Granulocytes: 0 %
Lymphocytes Relative: 33 %
Lymphs Abs: 2.6 10*3/uL (ref 0.7–4.0)
Monocytes Absolute: 0.7 10*3/uL (ref 0.1–1.0)
Monocytes Relative: 9 %
Neutro Abs: 4.3 10*3/uL (ref 1.7–7.7)
Neutrophils Relative %: 54 %

## 2024-08-27 LAB — COMPREHENSIVE METABOLIC PANEL WITH GFR
ALT: 192 U/L — ABNORMAL HIGH (ref 0–44)
AST: 193 U/L — ABNORMAL HIGH (ref 15–41)
Albumin: 3.7 g/dL (ref 3.5–5.0)
Alkaline Phosphatase: 226 U/L — ABNORMAL HIGH (ref 38–126)
Anion gap: 10 (ref 5–15)
BUN: <5 mg/dL — ABNORMAL LOW (ref 6–20)
CO2: 26 mmol/L (ref 22–32)
Calcium: 8.6 mg/dL — ABNORMAL LOW (ref 8.9–10.3)
Chloride: 104 mmol/L (ref 98–111)
Creatinine, Ser: 0.67 mg/dL (ref 0.44–1.00)
GFR, Estimated: >60 mL/min
Glucose, Bld: 85 mg/dL (ref 70–99)
Potassium: 3.7 mmol/L (ref 3.5–5.1)
Sodium: 140 mmol/L (ref 135–145)
Total Bilirubin: 0.4 mg/dL (ref 0.0–1.2)
Total Protein: 6.5 g/dL (ref 6.5–8.1)

## 2024-08-27 LAB — HEPATITIS PANEL, ACUTE
HCV Ab: NONREACTIVE
Hep A IgM: NONREACTIVE
Hep B C IgM: NONREACTIVE
Hepatitis B Surface Ag: NONREACTIVE

## 2024-08-27 LAB — TECHNOLOGIST SMEAR REVIEW: Plt Morphology: NORMAL

## 2024-08-27 LAB — CBC
HCT: 34 % — ABNORMAL LOW (ref 36.0–46.0)
Hemoglobin: 11 g/dL — ABNORMAL LOW (ref 12.0–15.0)
MCH: 28.1 pg (ref 26.0–34.0)
MCHC: 32.4 g/dL (ref 30.0–36.0)
MCV: 86.7 fL (ref 80.0–100.0)
Platelets: 315 10*3/uL (ref 150–400)
RBC: 3.92 MIL/uL (ref 3.87–5.11)
RDW: 13.2 % (ref 11.5–15.5)
WBC: 7.9 10*3/uL (ref 4.0–10.5)
nRBC: 0 % (ref 0.0–0.2)

## 2024-08-27 LAB — MISC LABCORP TEST (SEND OUT)
LabCorp test name: 144000
Labcorp test code: 144000

## 2024-08-27 LAB — LIPASE, BLOOD: Lipase: 85 U/L — ABNORMAL HIGH (ref 11–51)

## 2024-08-27 MED ORDER — IOHEXOL 9 MG/ML PO SOLN: 500.0000 mL | ORAL | Status: AC

## 2024-08-27 NOTE — Plan of Care (Signed)
educated
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
## 2024-08-27 NOTE — Progress Notes (Addendum)
 [facility]astroenterology Progress Note  Desiree Carlson 29 y.o. May 31, 1996  CC: Acute pancreatitis, abnormal LFTs, history of CLL   Subjective: Patient seen and examined at bedside.  Parents at bedside.  She is very upset that she has been having different pain and symptoms for 3 years and she has not received any reasonable explanation for it.  Complaining of back pain, shoulder pain, chest pain, abdominal pain and basically pain all over the body.  ROS : Anxious, crying, nervous   Objective: Vital signs in last 24 hours: Vitals:   08/26/24 1958 08/27/24 0527  BP: 122/72 114/67  Pulse: 65 80  Resp: 18 18  Temp: 98.2 F (36.8 C) 98.3 F (36.8 C)  SpO2: 100% 98%    Physical Exam: Very anxious patient, emotionally upset and crying.  Abdominal exam is benign.  No peritoneal signs.  Bowel sound present.  Lab Results: Recent Labs    08/26/24 0420 08/27/24 1020  NA 140 140  K 3.5 3.7  CL 104 104  CO2 27 26  GLUCOSE 77 85  BUN <5* <5*  CREATININE 0.72 0.67  CALCIUM  8.5* 8.6*   Recent Labs    08/26/24 0420 08/27/24 1020  AST 145* 193*  ALT 122* 192*  ALKPHOS 202* 226*  BILITOT 0.3 0.4  PROT 6.6 6.5  ALBUMIN 3.6 3.7   Recent Labs    08/26/24 0420 08/27/24 1020  WBC 9.3 7.9  NEUTROABS  --  4.3  HGB 11.6* 11.0*  HCT 35.7* 34.0*  MCV 86.9 86.7  PLT 250 315   No results for input(s): LABPROT, INR in the last 72 hours.    Assessment/Plan: - Acute pancreatitis.  Could be related to alcohol  use as patient had several drinks of alcohol  on her birthday.  No gallstone on the ultrasound.  Normal triglycerides  MRI and ultrasound showed no biliary dilation or obstructive lesion. - Abnormal LFTs.  Likely from above.  May have passed stone. - History of CML. - History of fibromyalgia, depression, attention deficit disorder - Diarrhea.  GI pathogen panel positive for E. coli and norovirus.  C. difficile antigen positive but toxin negative.  Dificid  has been  discontinued.  Recommendations -------------------------- - Patient is complaining of worsening abdominal pain despite of her MRI few days ago showing improvement in pancreatitis.  She is also concerned about ongoing worsening of her LFTs.  Would recommend repeat CT abdomen pelvis with IV contrast for further evaluation of ongoing abdominal pain.  She is also complaining of pain all over the body including back pain, bilateral chest pain neck pain etc.  Patient is convinced that she has cancer in her body and were not able to find it despite of multiple imaging studies.  - May consider treatment of E. coli with azithromycin if she continues to have diarrhea.  -Given her complex issues, I think she will benefit from evaluation at tertiary care center where she can have a multidisciplinary approach such as at [facility] or [facility].  More than 40 minutes spent in the patient encounter.  Patient examined alongside oncology physician.  More than 50% time spent face-to-face discussion.   Layla Lah MD, FACP 08/27/2024, 11:55 AM  Contact #  [phone]

## 2024-08-27 NOTE — Progress Notes (Signed)
 " PROGRESS NOTE    Desiree Carlson  FMW:[id number] DOB: 07-26-1996 DOA: 08/22/2024 PCP: Geofm Glade PARAS, MD    Brief Narrative: 29 year old female with a history of leukemia chronic pain syndrome fibromyalgia chronic intermittent asthma history of intracranial hypertension sleep apnea intolerant to CPAP, PTSD mainly followed at [facility] for all the treatments admitted with nausea abdominal pain. 2 to 3 days prior to admission to hospital patient was at a birthday party drinking 5 drinks of alcohol .  She reports her drinking habit as she drinks socially.  She is admitted with abdominal pain nausea and vomiting.  She is found to have acute pancreatitis and is admitted for the same. She is concerned Dasatinib  she takes could be contributing to her pancreatitis.  She has never had an episode of pancreatitis in the past.  She has alternating constipation and diarrhea consistent with IBS. CT abdomen Peripancreatic inflammatory stranding compatible with acute pancreatitis, without acute fluid collection or pseudocyst.Borderline diffuse hepatic steatosis.  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  No signs of bile biliary dilatation or gallstones.   Lipid panel LDL 128 triglycerides 88.   Lipase trending down. Stool C. difficile came back positive with antigen negative for toxin, GI panel positive for E. coli and norovirus.  Will DC fidaxomicin  discussed with infectious disease MRCP shows improved pancreatitis no gallstones or biliary ductal dilatation mild hepatic steatosis.  #2 leukemia on Dasatinib  followed at [facility]  #3 asthma stable  #4  history of endometriosis followed at [facility] she has had multiple surgeries for the same.  #5 history of anxiety depression PTSD she reports she is not taking zonisamide  at home this has been stopped, continue Effexor  though there is a concern if Effexor  could be causing pancreatitis on as needed alprazolam .  #6 history of fibromyalgia and chronic pain continue supportive measures and home meds  #7 abnormal elevated LFTs-LFTs have been trending up could this be secondary to Dasatinib ?  abdominal ultrasound with no evidence of stones or acute findings.    #8 diarrhea seems to have resolved she has not had any loose bowel movements overnight C. difficile antigen positive toxin negative mild leukocytosis GI pathogen panel norovirus and E. coli  Estimated body mass index is 32.02 kg/m as calculated from the following:   Height as of 08/17/24: 5' 9.5 (1.765 m).   Weight as of 08/17/24: 99.8 kg.  DVT prophylaxis: Lovenox   code Status: Full code  family Communication: None  disposition Plan:  Status is: Inpatient   Consultants:  GI  Procedures: None Antimicrobials none  Subjective:   Diarrhea resolved  Will advance diet very concerned about increasing LFTs  Objective: Vitals:   08/26/24 0753 08/26/24 1418 08/26/24 1958 08/27/24 0527  BP:  (!) 108/56 122/72 114/67  Pulse:  72 65 80  Resp:  16 18 18   Temp:  97.6 F (36.4 C) 98.2 F (36.8 C) 98.3 F (36.8 C)  TempSrc:  Oral Oral Oral  SpO2: 97% 97% 100% 98%    Intake/Output Summary (Last 24 hours) at 08/27/2024 1124 Last data filed at 08/26/2024 1614 Gross per 24 hour  Intake 697.88 ml  Output --  Net 697.88 ml   There were no vitals filed for this visit.  Examination:  General exam: Appears in nad Respiratory system: Clear to auscultation. Respiratory effort normal. Cardiovascular system:reg Gastrointestinal system: Abdomen is nondistended, soft and right upper quadrant tenderness no organomegaly or masses felt.  Normal bowel sounds heard. Central nervous system: Alert and oriented. No focal neurological deficits. Extremities: no edema   Data Reviewed: I have personally reviewed following labs and imaging studies  CBC: Recent Labs  Lab 08/23/24 0441 08/24/24 1045 08/25/24 0417 08/26/24 0420 08/27/24 1020  WBC 7.0 9.1 12.0* 9.3 7.9  NEUTROABS 5.6  --   --   --  4.3  HGB 11.6* 11.6* 11.3* 11.6* 11.0*  HCT 37.1 34.9* 35.0* 35.7* 34.0*  MCV 89.6 86.6 88.2 86.9 86.7  PLT 251 266 218 250 315   Basic Metabolic Panel: Recent Labs  Lab 08/23/24 0441 08/24/24 1045 08/25/24 0417 08/26/24 0420 08/27/24 1020  NA 138 138 140 140 140  K 3.4* 3.7 3.5 3.5 3.7  CL 105 102 104 104 104  CO2 24 25 28 27 26   GLUCOSE 89 68* 98 77 85  BUN 10 6 <5* <5* <5*  CREATININE 0.70 0.64 0.64 0.72 0.67  CALCIUM  8.0* 8.4* 8.7* 8.5* 8.6*  MG 1.7  --   --   --   --   PHOS 3.0  --   --   --   --    GFR: Estimated Creatinine Clearance: 132.9 mL/min (by C-G formula based on SCr of 0.67 mg/dL). Liver Function Tests: Recent Labs  Lab 08/23/24 0441 08/24/24 1045 08/25/24 0417 08/26/24 0420 08/27/24 1020  AST 35 109* 164* 145* 193*  ALT 20 76* 103* 122* 192*  ALKPHOS 84 147* 187* 202* 226*  BILITOT 0.6 0.3 0.5 0.3 0.4  PROT 6.1* 6.3* 6.7 6.6 6.5  ALBUMIN 3.5 3.5 3.6 3.6 3.7   Recent Labs  Lab 08/22/24 0908 08/23/24 0441 08/24/24 1045 08/25/24 0417  LIPASE 27 576* 141* 161*   No results for input(s): AMMONIA in the last 168 hours. Coagulation Profile: No results for input(s): INR, PROTIME in the last 168 hours. Cardiac Enzymes: No results for input(s): CKTOTAL, CKMB, CKMBINDEX, TROPONINI in the last 168 hours. BNP (last 3 results) No results for input(s): PROBNP in the last 8760 hours. HbA1C: No results for input(s): HGBA1C in the last 72 hours. CBG: Recent Labs  Lab 08/24/24 1751 08/25/24 0000  GLUCAP 64* 94   Lipid Profile: No results for input(s): CHOL, HDL,  LDLCALC, TRIG, CHOLHDL, LDLDIRECT in the last 72 hours.  Thyroid  Function Tests: No results for input(s): TSH, T4TOTAL, FREET4, T3FREE, THYROIDAB in the last 72 hours. Anemia Panel: No results for input(s): VITAMINB12, FOLATE, FERRITIN, TIBC, IRON, RETICCTPCT in the last 72 hours. Sepsis Labs: No results for input(s): PROCALCITON, LATICACIDVEN in the last 168 hours.  Recent Results (from the past 240 hours)  C Difficile Quick Screen (NO PCR Reflex)     Status: Abnormal   Collection Time: 08/25/24  1:35 AM   Specimen: Stool  Result Value Ref Range Status   C Diff antigen POSITIVE (A) NEGATIVE Final   C Diff toxin NEGATIVE NEGATIVE Final   C Diff interpretation   Final    Results are indeterminate. Please contact the provider listed for your campus for C diff questions in AMION.    Comment: Performed at [facility], [address] [place], [place] [address]  Gastrointestinal Panel by PCR , Stool  Status: Abnormal   Collection Time: 08/25/24  1:35 AM   Specimen: Stool  Result Value Ref Range Status   Campylobacter species NOT DETECTED NOT DETECTED Final   Plesimonas shigelloides NOT DETECTED NOT DETECTED Final   Salmonella species NOT DETECTED NOT DETECTED Final   Yersinia enterocolitica NOT DETECTED NOT DETECTED Final   Vibrio species NOT DETECTED NOT DETECTED Final   Vibrio cholerae NOT DETECTED NOT DETECTED Final   Enteroaggregative E coli (EAEC) DETECTED (A) NOT DETECTED Final    Comment: RESULT CALLED TO, READ BACK BY AND VERIFIED WITH: BOBBIE S., RN AT 1442 08/26/24 RAM    Enteropathogenic E coli (EPEC) NOT DETECTED NOT DETECTED Final   Enterotoxigenic E coli (ETEC) NOT DETECTED NOT DETECTED Final   Shiga like toxin producing E coli (STEC) NOT DETECTED NOT DETECTED Final   Shigella/Enteroinvasive E coli (EIEC) NOT DETECTED NOT DETECTED Final   Cryptosporidium NOT DETECTED NOT DETECTED Final   Cyclospora cayetanensis NOT  DETECTED NOT DETECTED Final   Entamoeba histolytica NOT DETECTED NOT DETECTED Final   Giardia lamblia NOT DETECTED NOT DETECTED Final   Adenovirus F40/41 NOT DETECTED NOT DETECTED Final   Astrovirus NOT DETECTED NOT DETECTED Final   Norovirus GI/GII DETECTED (A) NOT DETECTED Final    Comment: RESULT CALLED TO, READ BACK BY AND VERIFIED WITH: BOBBIE S., RN AT 1442 08/26/24 RAM    Rotavirus A NOT DETECTED NOT DETECTED Final   Sapovirus (I, II, IV, and V) NOT DETECTED NOT DETECTED Final    Comment: Performed at [facility], [address], [place], [place] [address]         Radiology Studies: US  Abdomen Complete Result Date: 08/25/2024 CLINICAL DATA:  Intractable nausea and vomiting. Acute pancreatitis. EXAM: ABDOMEN ULTRASOUND COMPLETE COMPARISON:  08/22/2024. FINDINGS: Gallbladder: No gallstones or wall thickening visualized. No sonographic Murphy sign noted by sonographer. Common bile duct: Diameter: 3.3 mm Liver: No focal lesion identified. The liver is borderline enlarged. Increased parenchymal echogenicity. Portal vein is patent on color Doppler imaging with normal direction of blood flow towards the liver. IVC: No abnormality visualized. Pancreas: Not well seen due to overlying bowel gas. Spleen: Spleen is borderline enlarged at 12.5 cm. Right Kidney: Length: 13.2 cm. Echogenicity within normal limits. No mass or hydronephrosis visualized. Left Kidney: Length: 12.2 cm. Echogenicity within normal limits. No mass or hydronephrosis visualized. Abdominal aorta: No aneurysm visualized. Other findings: No free fluid. IMPRESSION: 1. Pancreas is not well seen due to overlying bowel gas. 2. Hepatic steatosis. Electronically Signed   By: Leita Birmingham M.D.   On: 08/25/2024 17:44        Scheduled Meds:  calcium  carbonate  1 tablet Oral BID WC   dasatinib   50 mg Oral Q24H   enoxaparin  (LOVENOX ) injection  40 mg Subcutaneous QHS   fluticasone  furoate-vilanterol  1 puff Inhalation  Daily   tiZANidine   2 mg Oral QHS   venlafaxine  XR  150 mg Oral QHS   cyanocobalamin   100 mcg Oral Daily   Continuous Infusions:  sodium chloride  75 mL/hr at 08/26/24 1614   promethazine  (PHENERGAN ) injection (IM or IVPB) 12.5 mg (08/27/24 0945)     LOS: 4 days     Almarie KANDICE Hoots, MD  08/27/2024, 11:24 AM   "

## 2024-08-28 ENCOUNTER — Emergency Department (HOSPITAL_COMMUNITY)
Admission: EM | Admit: 2024-08-28 | Discharge: 2024-08-29 | Disposition: A | Attending: Emergency Medicine | Admitting: Emergency Medicine

## 2024-08-28 ENCOUNTER — Emergency Department (HOSPITAL_COMMUNITY)

## 2024-08-28 ENCOUNTER — Other Ambulatory Visit: Payer: Self-pay

## 2024-08-28 ENCOUNTER — Encounter (HOSPITAL_COMMUNITY): Payer: Self-pay

## 2024-08-28 DIAGNOSIS — M25551 Pain in right hip: Secondary | ICD-10-CM | POA: Diagnosis not present

## 2024-08-28 DIAGNOSIS — J45909 Unspecified asthma, uncomplicated: Secondary | ICD-10-CM | POA: Diagnosis not present

## 2024-08-28 DIAGNOSIS — R0789 Other chest pain: Secondary | ICD-10-CM | POA: Insufficient documentation

## 2024-08-28 DIAGNOSIS — R55 Syncope and collapse: Secondary | ICD-10-CM | POA: Insufficient documentation

## 2024-08-28 DIAGNOSIS — R519 Headache, unspecified: Secondary | ICD-10-CM | POA: Diagnosis not present

## 2024-08-28 DIAGNOSIS — M542 Cervicalgia: Secondary | ICD-10-CM | POA: Insufficient documentation

## 2024-08-28 DIAGNOSIS — D649 Anemia, unspecified: Secondary | ICD-10-CM | POA: Insufficient documentation

## 2024-08-28 DIAGNOSIS — S0990XA Unspecified injury of head, initial encounter: Secondary | ICD-10-CM | POA: Diagnosis present

## 2024-08-28 DIAGNOSIS — M7918 Myalgia, other site: Secondary | ICD-10-CM

## 2024-08-28 DIAGNOSIS — W19XXXA Unspecified fall, initial encounter: Secondary | ICD-10-CM | POA: Diagnosis not present

## 2024-08-28 DIAGNOSIS — R7401 Elevation of levels of liver transaminase levels: Secondary | ICD-10-CM | POA: Insufficient documentation

## 2024-08-28 DIAGNOSIS — K859 Acute pancreatitis without necrosis or infection, unspecified: Secondary | ICD-10-CM | POA: Diagnosis not present

## 2024-08-28 LAB — COMPREHENSIVE METABOLIC PANEL WITH GFR
ALT: 141 U/L — ABNORMAL HIGH (ref 0–44)
ALT: 126 U/L — ABNORMAL HIGH (ref 0–44)
AST: 83 U/L — ABNORMAL HIGH (ref 15–41)
AST: 60 U/L — ABNORMAL HIGH (ref 15–41)
Albumin: 3.5 g/dL (ref 3.5–5.0)
Albumin: 3.9 g/dL (ref 3.5–5.0)
Alkaline Phosphatase: 193 U/L — ABNORMAL HIGH (ref 38–126)
Alkaline Phosphatase: 200 U/L — ABNORMAL HIGH (ref 38–126)
Anion gap: 8 (ref 5–15)
Anion gap: 11 (ref 5–15)
BUN: <5 mg/dL — ABNORMAL LOW (ref 6–20)
BUN: <5 mg/dL — ABNORMAL LOW (ref 6–20)
CO2: 28 mmol/L (ref 22–32)
CO2: 24 mmol/L (ref 22–32)
Calcium: 8.4 mg/dL — ABNORMAL LOW (ref 8.9–10.3)
Calcium: 9.1 mg/dL (ref 8.9–10.3)
Chloride: 105 mmol/L (ref 98–111)
Chloride: 104 mmol/L (ref 98–111)
Creatinine, Ser: 0.69 mg/dL (ref 0.44–1.00)
Creatinine, Ser: 0.68 mg/dL (ref 0.44–1.00)
GFR, Estimated: >60 mL/min
GFR, Estimated: >60 mL/min
Glucose, Bld: 81 mg/dL (ref 70–99)
Glucose, Bld: 89 mg/dL (ref 70–99)
Potassium: 3.8 mmol/L (ref 3.5–5.1)
Potassium: 3.8 mmol/L (ref 3.5–5.1)
Sodium: 141 mmol/L (ref 135–145)
Sodium: 139 mmol/L (ref 135–145)
Total Bilirubin: 0.2 mg/dL (ref 0.0–1.2)
Total Bilirubin: 0.3 mg/dL (ref 0.0–1.2)
Total Protein: 6.2 g/dL — ABNORMAL LOW (ref 6.5–8.1)
Total Protein: 7.1 g/dL (ref 6.5–8.1)

## 2024-08-28 LAB — URINALYSIS, ROUTINE W REFLEX MICROSCOPIC
Bilirubin Urine: NEGATIVE
Bilirubin Urine: NEGATIVE
Glucose, UA: NEGATIVE mg/dL
Glucose, UA: NEGATIVE mg/dL
Hgb urine dipstick: NEGATIVE
Hgb urine dipstick: NEGATIVE
Ketones, ur: NEGATIVE mg/dL
Ketones, ur: NEGATIVE mg/dL
Leukocytes,Ua: NEGATIVE
Leukocytes,Ua: NEGATIVE
Nitrite: NEGATIVE
Nitrite: NEGATIVE
Protein, ur: NEGATIVE mg/dL
Protein, ur: NEGATIVE mg/dL
Specific Gravity, Urine: 1.009 (ref 1.005–1.030)
Specific Gravity, Urine: 1.008 (ref 1.005–1.030)
pH: 6 (ref 5.0–8.0)
pH: 8 (ref 5.0–8.0)

## 2024-08-28 LAB — CBC
HCT: 33.1 % — ABNORMAL LOW (ref 36.0–46.0)
HCT: 35.4 % — ABNORMAL LOW (ref 36.0–46.0)
Hemoglobin: 10.3 g/dL — ABNORMAL LOW (ref 12.0–15.0)
Hemoglobin: 11.6 g/dL — ABNORMAL LOW (ref 12.0–15.0)
MCH: 27.8 pg (ref 26.0–34.0)
MCH: 28.6 pg (ref 26.0–34.0)
MCHC: 31.1 g/dL (ref 30.0–36.0)
MCHC: 32.8 g/dL (ref 30.0–36.0)
MCV: 89.5 fL (ref 80.0–100.0)
MCV: 87.2 fL (ref 80.0–100.0)
Platelets: 302 10*3/uL (ref 150–400)
Platelets: 360 10*3/uL (ref 150–400)
RBC: 3.7 MIL/uL — ABNORMAL LOW (ref 3.87–5.11)
RBC: 4.06 MIL/uL (ref 3.87–5.11)
RDW: 13.2 % (ref 11.5–15.5)
RDW: 13.1 % (ref 11.5–15.5)
WBC: 9.4 10*3/uL (ref 4.0–10.5)
WBC: 9.8 10*3/uL (ref 4.0–10.5)
nRBC: 0 % (ref 0.0–0.2)
nRBC: 0 % (ref 0.0–0.2)

## 2024-08-28 LAB — HCG, SERUM, QUALITATIVE: Preg, Serum: NEGATIVE

## 2024-08-28 MED ORDER — OXYCODONE HCL 5 MG PO TABS: 5.0000 mg | ORAL_TABLET | ORAL | 0 refills | Status: AC | PRN

## 2024-08-28 MED ORDER — ONDANSETRON HCL 4 MG PO TABS: 4.0000 mg | ORAL_TABLET | Freq: Four times a day (QID) | ORAL | 0 refills | Status: AC | PRN

## 2024-08-28 MED ORDER — SIMETHICONE 80 MG PO CHEW: 80.0000 mg | CHEWABLE_TABLET | Freq: Four times a day (QID) | ORAL | 0 refills | Status: AC | PRN

## 2024-08-28 NOTE — Plan of Care (Signed)
  Problem: Clinical Measurements: Goal: Cardiovascular complication will be avoided Outcome: Progressing   Problem: Nutrition: Goal: Adequate nutrition will be maintained Outcome: Progressing   Problem: Pain Managment: Goal: General experience of comfort will improve and/or be controlled Outcome: Progressing

## 2024-08-28 NOTE — Progress Notes (Signed)
 Pt discharged. Medications returned to patient. All belongings gathered at bedside. IV removed. All questions asked and answered.

## 2024-08-28 NOTE — ED Provider Notes (Signed)
 " [place] EMERGENCY DEPARTMENT AT [facility] Provider Note   CSN: [id number] Arrival date & time: 08/28/24  2055     Patient presents with: [place]   Desiree Carlson is a 29 y.o. female.  Past medical history significant for asthma, anxiety, vasovagal syncope presents today after a ground-level fall due to a near syncopal episode.  Patient reports right hip pain, right rib pain, tinnitus, blurred vision, headache, and neck pain.  Patient denies head injury, nausea, vomiting, fever, chills, numbness, weakness, loss of bowel or bladder control, or saddle anesthesia.  Patient denies blood thinner use.   {Add pertinent medical, surgical, social history, OB history to YEP:67052}  Fall       Prior to Admission medications  Medication Sig Start Date End Date Taking? Authorizing Provider  acetaminophen  (TYLENOL ) 500 MG tablet Take 500-1,000 mg by mouth every 8 (eight) hours as needed for mild pain (pain score 1-3).    [provider]  albuterol  (VENTOLIN  HFA) 108 (90 Base) MCG/ACT inhaler Inhale 1-2 puffs into the lungs every 6 (six) hours as needed for wheezing or shortness of breath. Patient taking differently: Inhale 2 puffs into the lungs every 6 (six) hours as needed for wheezing or shortness of breath. 07/24/24   Geofm Glade PARAS, MD  amphetamine -dextroamphetamine  (ADDERALL XR) 30 MG 24 hr capsule Take 30 mg by mouth every morning. 04/13/24   [provider]  budesonide -formoterol  (SYMBICORT ) 160-4.5 MCG/ACT inhaler Inhale 2 puffs into the lungs 2 (two) times daily as needed. Patient taking differently: Inhale 2 puffs into the lungs 2 (two) times daily as needed (for respiratory flares). 05/17/23   Geofm Glade PARAS, MD  CALCIUM  PO Take 1 tablet by mouth See admin instructions. Calcium  gummie - Chew 1 gummie by mouth once a day    [provider]  Cholecalciferol (VITAMIN D3) 1000 units CAPS Take 1,000 Units by mouth daily.    [provider]   clobetasol  ointment (TEMOVATE ) 0.05 % Apply 1 Application topically 2 (two) times daily. Place in a thin layer of the affected areas twice a day for 2 weeks.  Then place on the affected areas twice a week at bedtime. Patient taking differently: Apply 1 Application topically daily as needed (for irritation- a thin layer). 04/24/24   Cathlyn JAYSON Nikki Bobie FORBES, MD  cyanocobalamin  100 MCG tablet Take 100 mcg by mouth.    [provider]  dasatinib  (SPRYCEL ) 50 MG tablet Take 50 mg by mouth at bedtime. 11/14/23 11/13/24  [provider]  DERMOTIC 0.01 % OIL Place 3 drops into both ears See admin instructions. PLACE 3 DROPS INTO BOTH EARS TWICE A DAY ON WED & SAT    [provider]  diazepam  (VALIUM ) 5 MG tablet Place 1 tablet vaginally nightly as needed for muscle spasm/ pelvic pain. 04/11/23   Zuleta, Kaitlin G, NP  fluticasone  (FLONASE) 50 MCG/ACT nasal spray Place 1 spray into both nostrils 2 (two) times daily as needed (for seasonal allergies). 04/23/21   [provider]  gabapentin (NEURONTIN) 100 MG capsule Take 100 mg by mouth daily as needed (for pain). 12/20/23 12/19/24  [provider]  loperamide (IMODIUM) 2 MG capsule Take 2 mg by mouth daily as needed for diarrhea or loose stools. 04/23/21   [provider]  loratadine (CLARITIN) 10 MG tablet Take 10 mg by mouth daily as needed (for seasonal allergies).    [provider]  LORazepam  (ATIVAN ) 1 MG tablet Take 1 mg  by mouth 2 (two) times daily as needed for anxiety. 04/09/24   [provider]  ondansetron  (ZOFRAN ) 4 MG tablet Take 1 tablet (4 mg total) by mouth every 6 (six) hours as needed for nausea. 08/28/24   Will Almarie MATSU, MD  oxyCODONE  (OXY IR/ROXICODONE ) 5 MG immediate release tablet Take 1 tablet (5 mg total) by mouth every 4 (four) hours as needed for moderate pain (pain score 4-6). 08/28/24   Will Almarie MATSU, MD  pilocarpine (SALAGEN) 5 MG tablet Take 5 mg by mouth  in the morning and at bedtime.    [provider]  prochlorperazine  (COMPAZINE ) 10 MG tablet Take 10 mg by mouth every 6 (six) hours as needed for nausea or vomiting. 04/23/21   [provider]  rizatriptan (MAXALT) 10 MG tablet Take 10 mg by mouth daily as needed for migraine. 03/15/24 03/15/25  [provider]  simethicone  (MYLICON) 80 MG chewable tablet Chew 1 tablet (80 mg total) by mouth every 6 (six) hours as needed for flatulence. 08/28/24   Will Almarie MATSU, MD  tiZANidine  (ZANAFLEX ) 2 MG tablet Take 2 mg by mouth at bedtime. 08/13/24   [provider]  venlafaxine  XR (EFFEXOR -XR) 150 MG 24 hr capsule Take 150 mg by mouth at bedtime. 04/09/24 04/09/25  [provider]    Allergies: Duloxetine, Scemblix [asciminib], Blood orange os [flavoring agent], Bupropion, Cefdinir, Grapefruit oil, Pomegranate (punica granatum), and Vortioxetine    Review of Systems  Musculoskeletal:  Positive for arthralgias.  Neurological:  Positive for light-headedness.    Updated Vital Signs BP 105/74   Pulse 78   Temp 98.7 F (37.1 C) (Oral)   Resp 20   LMP 06/20/2024 (Within Weeks)   SpO2 99%   Physical Exam Vitals and nursing note reviewed.  Constitutional:      General: She is not in acute distress.    Appearance: She is well-developed. She is not toxic-appearing.  HENT:     Head: Normocephalic and atraumatic.     Right Ear: External ear normal.     Left Ear: External ear normal.  Eyes:     Extraocular Movements: Extraocular movements intact.     Conjunctiva/sclera: Conjunctivae normal.     Pupils: Pupils are equal, round, and reactive to light.  Cardiovascular:     Rate and Rhythm: Normal rate and regular rhythm.     Pulses: Normal pulses.     Heart sounds: Normal heart sounds. No murmur heard. Pulmonary:     Effort: Pulmonary effort is normal. No respiratory distress.     Breath sounds: Normal breath sounds.  Abdominal:     General: There is no  distension.     Palpations: Abdomen is soft.     Tenderness: There is no abdominal tenderness.  Musculoskeletal:        General: Tenderness and signs of injury present. No swelling or deformity.     Cervical back: Normal range of motion and neck supple.     Right lower leg: No edema.     Left lower leg: No edema.     Comments: Tenderness to palpation of the right hip, anterior right ribs, no obvious deformity noted on exam.  Patient is neurovascularly intact with +2 dorsalis pedis pulses.  Skin:    General: Skin is warm and dry.     Capillary Refill: Capillary refill takes less than 2 seconds.     Findings: No bruising.  Neurological:     Mental Status: She is alert.  Psychiatric:        Mood and Affect: Mood normal.     (all labs ordered are listed, but only abnormal results are displayed) Labs Reviewed - No data to display  EKG: None  Radiology: No results found.  {Document cardiac monitor, telemetry assessment procedure when appropriate:32947} Procedures   Medications Ordered in the ED - No data to display    {Click here for ABCD2, HEART and other calculators REFRESH Note before signing:1}                              Medical Decision Making Amount and/or Complexity of Data Reviewed Labs: ordered. Radiology: ordered.   This patient presents to the ED for concern of near syncopal episode, fall, this involves an extensive number of treatment options, and is a complaint that carries with it a high risk of complications and morbidity.  The differential diagnosis includes fracture, brain bleed, minor head injury, dislocation, C-spine injury, cauda equina syndrome   Co morbidities / Chronic conditions that complicate the patient evaluation  Vasovagal syncope, concussion, anxiety   Additional history obtained:  Additional history obtained from EMR External records from outside source obtained and reviewed including Care Everywhere   Lab Tests:  I Ordered, and  personally interpreted labs.  The pertinent results include: Mild anemia at 11.6, UA unremarkable, elevated alk phos at 200, elevated AST at 60, elevated ALT at 126 which has improved since admission, negative pregnancy   Imaging Studies ordered:  I ordered imaging studies including CT head and C-spine Noncon I independently visualized and interpreted imaging which showed no significant abnormality of the C-spine, no acute intracranial abnormality I agree with the radiologist interpretation Right rib x-rays: No acute rib fracture.  No acute cardiopulmonary abnormality Right hip x-ray: No acute fracture or dislocation in the right hip or pelvis.   Cardiac Monitoring: / EKG:  The patient was maintained on a cardiac monitor.  I personally viewed and interpreted the cardiac monitored which showed an underlying rhythm of: ***   Problem List / ED Course / Critical interventions / Medication management  *** I ordered medication including ***   Reevaluation of the patient after these medicines showed that the patient *** I have reviewed the patients home medicines and have made adjustments as needed   Consultations Obtained:  I requested consultation with the ***,  and discussed lab and imaging findings as well as pertinent plan - they recommend: ***  Test / Admission - Considered:  ***   {Document critical care time when appropriate  Document review of labs and clinical decision tools ie CHADS2VASC2, etc  Document your independent review of radiology images and any outside records  Document your discussion with family members, caretakers and with consultants  Document social determinants of health affecting pt's care  Document your decision making why or why not admission, treatments were needed:32947:::1}   Final diagnoses:  None    ED Discharge Orders     None        "

## 2024-08-28 NOTE — ED Triage Notes (Signed)
 Pt BIB EMS from home with reports of a fall at ground level. Pt had a near syncopal episode. Pt fell onto her right hip and right side. Last pain medication taken at 1 pm today. Pt was trying to take a shower when she fell.

## 2024-08-28 NOTE — Discharge Summary (Signed)
 Physician Discharge Summary  Desiree Carlson FMW:[id number] DOB: 06-10-1996 DOA: 08/22/2024  PCP: Geofm Glade PARAS, MD  Admit date: 08/22/2024 Discharge date: 08/28/2024  Admitted From: Home Disposition: Home Recommendations for Outpatient Follow-up:  Follow up with PCP in 1-2 weeks Please obtain BMP/CBC in one week Please follow up with oncologist with [facility]: None  Equipment/Devices none Discharge Condition: Stable CODE STATUS: Full code Diet recommendation: Cardiac Brief/Interim Summary:  29 year old female with a history of leukemia chronic pain syndrome fibromyalgia chronic intermittent asthma history of intracranial hypertension sleep apnea intolerant to CPAP, PTSD mainly followed at [facility] for all the treatments admitted with nausea abdominal pain. 2 to 3 days prior to admission to hospital patient was at a birthday party drinking 5 drinks of alcohol .  She reports her drinking habit as she drinks socially.  She is admitted with abdominal pain nausea and vomiting.  She is found to have acute pancreatitis and is admitted for the same. She is concerned Dasatinib  she takes could be contributing to her pancreatitis.  She has never had an episode of pancreatitis in the past.  She has alternating constipation and diarrhea consistent with IBS. CT abdomen Peripancreatic inflammatory stranding compatible with acute pancreatitis, without acute fluid collection or pseudocyst.Borderline diffuse hepatic steatosis.  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  No signs of bile biliary dilatation or gallstones.   Lipid panel LDL 128  triglycerides 88.   Lipase 85 down from 576 on admission. Stool C. difficile came back positive with antigen negative for toxin, GI panel positive for E. coli and norovirus.  She was treated conservatively.  MRCP shows improved pancreatitis no gallstones or biliary ductal dilatation mild hepatic steatosis.   #2 leukemia on Dasatinib  followed at [facility]   #3 asthma stable   #4 history of endometriosis followed at [facility] she has had multiple surgeries for the same.   #5 history of anxiety depression PTSD continue Effexor  as needed alprazolam .   #6 history of fibromyalgia and chronic pain continue supportive measures and home meds   #7 abnormal elevated LFTs-LFTs have been trending down.  Follow-up LFTs as an outpatient in 2 weeks.  AST 83 from 193, ALT 141 from 192, alk phos 193 from 226. abdominal ultrasound with no evidence of stones or acute findings.     #8 diarrhea seems to have resolved she has not had any loose bowel movements overnight C. difficile antigen positive toxin negative mild leukocytosis GI pathogen panel norovirus and E. coli    Estimated body mass index is 32.02 kg/m as calculated from the following:   Height as of 08/17/24: 5' 9.5 (1.765 m).   Weight as of 08/17/24: 99.8 kg.  Discharge Instructions  Discharge Instructions     Increase activity slowly   Complete by: As directed       Allergies as of 08/28/2024       Reactions   Duloxetine Hives   Scemblix [asciminib] Other (See Comments)   Horrible pain   Blood Orange Os [flavoring Agent] Other (See Comments)   Contraindicated with Sprycel    Bupropion Other (See Comments)   Skin was burning   Cefdinir Nausea Only, Other (  See Comments)   Upset stomach, felt really bad   Grapefruit Oil Other (See Comments)   Contraindicated with Sprycel    Pomegranate (punica Granatum) Other (See Comments)   Contraindicated with Sprycel    Vortioxetine Nausea And Vomiting        Medication List      STOP taking these medications    cefUROXime  500 MG tablet Commonly known as: CEFTIN    HYDROcodone  bit-homatropine 5-1.5 MG/5ML syrup Commonly known as: HYCODAN   ibuprofen  200 MG tablet Commonly known as: ADVIL    Midol  Complete 500-60-15 MG Tabs Generic drug: Acetaminophen -Caff-Pyrilamine   montelukast  10 MG tablet Commonly known as: SINGULAIR    norethindrone  0.35 MG tablet Commonly known as: Ortho Micronor    nystatin  ointment Commonly known as: MYCOSTATIN    nystatin  powder Commonly known as: MYCOSTATIN /NYSTOP        TAKE these medications    acetaminophen  500 MG tablet Commonly known as: TYLENOL  Take 500-1,000 mg by mouth every 8 (eight) hours as needed for mild pain (pain score 1-3).   albuterol  108 (90 Base) MCG/ACT inhaler Commonly known as: VENTOLIN  HFA Inhale 1-2 puffs into the lungs every 6 (six) hours as needed for wheezing or shortness of breath. What changed: how much to take   amphetamine -dextroamphetamine  30 MG 24 hr capsule Commonly known as: ADDERALL XR Take 30 mg by mouth every morning.   budesonide -formoterol  160-4.5 MCG/ACT inhaler Commonly known as: SYMBICORT  Inhale 2 puffs into the lungs 2 (two) times daily as needed. What changed: reasons to take this   CALCIUM  PO Take 1 tablet by mouth See admin instructions. Calcium  gummie - Chew 1 gummie by mouth once a day   clobetasol  ointment 0.05 % Commonly known as: TEMOVATE  Apply 1 Application topically 2 (two) times daily. Place in a thin layer of the affected areas twice a day for 2 weeks.  Then place on the affected areas twice a week at bedtime. What changed:  when to take this reasons to take this additional instructions   cyanocobalamin  100 MCG tablet Take 100 mcg by mouth.   dasatinib  50 MG tablet Commonly known as: SPRYCEL  Take 50 mg by mouth at bedtime.   DermOtic 0.01 % Oil Generic drug: Fluocinolone Acetonide Place 3 drops into both ears See admin instructions. PLACE 3  DROPS INTO BOTH EARS TWICE A DAY ON WED & SAT   diazepam  5 MG tablet Commonly known as: VALIUM  Place 1 tablet vaginally nightly as needed for muscle spasm/ pelvic pain.   fluticasone  50 MCG/ACT nasal spray Commonly known as: FLONASE Place 1 spray into both nostrils 2 (two) times daily as needed (for seasonal allergies).   gabapentin 100 MG capsule Commonly known as: NEURONTIN Take 100 mg by mouth daily as needed (for pain).   loperamide 2 MG capsule Commonly known as: IMODIUM Take 2 mg by mouth daily as needed for diarrhea or loose stools.   loratadine 10 MG tablet Commonly known as: CLARITIN Take 10 mg by mouth daily as needed (for seasonal allergies).   LORazepam  1 MG tablet Commonly known as: ATIVAN  Take 1 mg by mouth 2 (two) times daily as needed for anxiety.   ondansetron  4 MG tablet Commonly known as: ZOFRAN  Take 1 tablet (4 mg total) by mouth every 6 (six) hours as needed for nausea. What changed: when to take this   oxyCODONE  5 MG immediate release tablet Commonly known as: Oxy IR/ROXICODONE  Take 1 tablet (5 mg total) by mouth every 4 (four) hours as needed for moderate pain (pain score  4-6).   pilocarpine 5 MG tablet Commonly known as: SALAGEN Take 5 mg by mouth in the morning and at bedtime.   prochlorperazine  10 MG tablet Commonly known as: COMPAZINE  Take 10 mg by mouth every 6 (six) hours as needed for nausea or vomiting.   rizatriptan 10 MG tablet Commonly known as: MAXALT Take 10 mg by mouth daily as needed for migraine.   simethicone  80 MG chewable tablet Commonly known as: MYLICON Chew 1 tablet (80 mg total) by mouth every 6 (six) hours as needed for flatulence.   tiZANidine  2 MG tablet Commonly known as: ZANAFLEX  Take 2 mg by mouth at bedtime.   venlafaxine  XR 150 MG 24 hr capsule Commonly known as: EFFEXOR -XR Take 150 mg by mouth at bedtime.   Vitamin D3 1000 units Caps Take 1,000 Units by mouth daily.         Allergies[1]  Consultations gi onc  Procedures/Studies: US  Abdomen Complete Result Date: 08/25/2024 CLINICAL DATA:  Intractable nausea and vomiting. Acute pancreatitis. EXAM: ABDOMEN ULTRASOUND COMPLETE COMPARISON:  08/22/2024. FINDINGS: Gallbladder: No gallstones or wall thickening visualized. No sonographic Murphy sign noted by sonographer. Common bile duct: Diameter: 3.3 mm Liver: No focal lesion identified. The liver is borderline enlarged. Increased parenchymal echogenicity. Portal vein is patent on color Doppler imaging with normal direction of blood flow towards the liver. IVC: No abnormality visualized. Pancreas: Not well seen due to overlying bowel gas. Spleen: Spleen is borderline enlarged at 12.5 cm. Right Kidney: Length: 13.2 cm. Echogenicity within normal limits. No mass or hydronephrosis visualized. Left Kidney: Length: 12.2 cm. Echogenicity within normal limits. No mass or hydronephrosis visualized. Abdominal aorta: No aneurysm visualized. Other findings: No free fluid. IMPRESSION: 1. Pancreas is not well seen due to overlying bowel gas. 2. Hepatic steatosis. Electronically Signed   By: Leita Birmingham M.D.   On: 08/25/2024 17:44   DG Scapula Left Result Date: 08/24/2024 EXAM: 1 VIEW(S) XRAY OF THE LEFT SCAPULA 08/24/2024 09:35:00 AM COMPARISON: None available. CLINICAL HISTORY: Pain. FINDINGS: BONES AND JOINTS: Curved acromial undersurface. No fracture or acute bony finding is readily apparent. No malalignment at the glenohumeral joint or acromioclavicular joint. SOFT TISSUES: Unremarkable. IMPRESSION: 1. No acute osseous abnormality. Electronically signed by: Ryan Salvage MD 08/24/2024 01:31 PM EST RP Workstation: [id number]   DG Shoulder Left Result Date: 08/24/2024 EXAM: 1 VIEW(S) XRAY OF THE LEFT SHOULDER 08/24/2024 09:35:00 AM COMPARISON: None available. CLINICAL HISTORY: Pain. FINDINGS: BONES AND JOINTS: Glenohumeral joint is normally aligned. No acute fracture. No  malalignment. The [facility] joint is unremarkable. SOFT TISSUES: No abnormal calcifications. Visualized lung is unremarkable. IMPRESSION: 1. No acute findings. Electronically signed by: Donnice Mania MD 08/24/2024 01:22 PM EST RP Workstation: [id number]   MR ABDOMEN MRCP W WO CONTRAST Result Date: 08/24/2024 CLINICAL DATA:  Acute pancreatitis EXAM: MRI ABDOMEN WITHOUT AND WITH CONTRAST (INCLUDING MRCP) TECHNIQUE: Multiplanar multisequence MR imaging of the abdomen was performed both before and after the administration of intravenous contrast. Heavily T2-weighted images of the biliary and pancreatic ducts were obtained, and three-dimensional MRCP images were rendered by post processing. CONTRAST:  10mL GADAVIST  GADOBUTROL  1 MMOL/ML IV SOLN COMPARISON:  CT 08/22/2024 FINDINGS: The MRCP images are mildly motion degraded. Lower chest: Normal heart size without pericardial or pleural effusion. Hepatobiliary: Mild hepatic steatosis. No suspicious liver lesion. Normal gallbladder. No intra or extrahepatic biliary duct dilatation. The common duct measures 2-3 mm on image 20/3. No choledocholithiasis. Pancreas: Markedly improved peripancreatic edema. Minimal thickening of the anterior left pararenal fascia  remains on image 23/4. No areas of pancreatic necrosis. No duct dilatation. No peripancreatic fluid collection. No pancreas divisum. Spleen:  Normal in size, without focal abnormality. Adrenals/Urinary Tract: Normal adrenal glands. Normal kidneys, without hydronephrosis. Stomach/Bowel: Normal stomach and abdominal bowel loops. Vascular/Lymphatic: Normal caliber of the aorta and branch vessels. No retroperitoneal or retrocrural adenopathy. Other:  No ascites. Musculoskeletal: No acute osseous abnormality. IMPRESSION: Markedly improved, nearly resolved pancreatitis. No cholelithiasis, biliary duct dilatation, or choledocholithiasis. Mild hepatic steatosis. Electronically Signed   By: Rockey Kilts M.D.   On: 08/24/2024 08:53    MR 3D Recon At Scanner Result Date: 08/24/2024 CLINICAL DATA:  Acute pancreatitis EXAM: MRI ABDOMEN WITHOUT AND WITH CONTRAST (INCLUDING MRCP) TECHNIQUE: Multiplanar multisequence MR imaging of the abdomen was performed both before and after the administration of intravenous contrast. Heavily T2-weighted images of the biliary and pancreatic ducts were obtained, and three-dimensional MRCP images were rendered by post processing. CONTRAST:  10mL GADAVIST  GADOBUTROL  1 MMOL/ML IV SOLN COMPARISON:  CT 08/22/2024 FINDINGS: The MRCP images are mildly motion degraded. Lower chest: Normal heart size without pericardial or pleural effusion. Hepatobiliary: Mild hepatic steatosis. No suspicious liver lesion. Normal gallbladder. No intra or extrahepatic biliary duct dilatation. The common duct measures 2-3 mm on image 20/3. No choledocholithiasis. Pancreas: Markedly improved peripancreatic edema. Minimal thickening of the anterior left pararenal fascia remains on image 23/4. No areas of pancreatic necrosis. No duct dilatation. No peripancreatic fluid collection. No pancreas divisum. Spleen:  Normal in size, without focal abnormality. Adrenals/Urinary Tract: Normal adrenal glands. Normal kidneys, without hydronephrosis. Stomach/Bowel: Normal stomach and abdominal bowel loops. Vascular/Lymphatic: Normal caliber of the aorta and branch vessels. No retroperitoneal or retrocrural adenopathy. Other:  No ascites. Musculoskeletal: No acute osseous abnormality. IMPRESSION: Markedly improved, nearly resolved pancreatitis. No cholelithiasis, biliary duct dilatation, or choledocholithiasis. Mild hepatic steatosis. Electronically Signed   By: Rockey Kilts M.D.   On: 08/24/2024 08:53   CT ABDOMEN PELVIS WO CONTRAST Result Date: 08/22/2024 EXAM: CT ABDOMEN AND PELVIS WITHOUT CONTRAST 08/22/2024 02:03:23 PM TECHNIQUE: CT of the abdomen and pelvis was performed without the administration of intravenous contrast. Multiplanar reformatted  images are provided for review. Automated exposure control, iterative reconstruction, and/or weight-based adjustment of the mA/kV was utilized to reduce the radiation dose to as low as reasonably achievable. COMPARISON: 12/14/2022 CLINICAL HISTORY: Abdominal/pelvic pain with vomiting and diarrhea. FINDINGS: LOWER CHEST: No acute abnormality. LIVER: Borderline appearance for diffuse hepatic steatosis. GALLBLADDER AND BILE DUCTS: Gallbladder is unremarkable. No biliary ductal dilatation. SPLEEN: No acute abnormality. PANCREAS: Peripancreatic inflammatory stranding compatible with acute pancreatitis. No acute fluid collection or pseudocyst identified. ADRENAL GLANDS: No acute abnormality. KIDNEYS, URETERS AND BLADDER: No stones in the kidneys or ureters. No hydronephrosis. No perinephric or periureteral stranding. Urinary bladder is unremarkable. GI AND BOWEL: Stomach demonstrates no acute abnormality. There is no bowel obstruction. Normal appendix. PERITONEUM AND RETROPERITONEUM: No ascites. No free air. VASCULATURE: Aorta is normal in caliber. LYMPH NODES: No lymphadenopathy. REPRODUCTIVE ORGANS: No acute abnormality. BONES AND SOFT TISSUES: No acute osseous abnormality. No focal soft tissue abnormality. IMPRESSION: 1. Peripancreatic inflammatory stranding compatible with acute pancreatitis, without acute fluid collection or pseudocyst. 2. Borderline diffuse hepatic steatosis. Electronically signed by: Ryan Salvage MD 08/22/2024 02:32 PM EST RP Workstation: [id number]   DG Chest 2 View Result Date: 08/07/2024 EXAM: 2 VIEW(S) XRAY OF THE CHEST 08/07/2024 04:03:59 PM COMPARISON: 12/21/2023 CLINICAL HISTORY: cough FINDINGS: LUNGS AND PLEURA: No focal pulmonary opacity. No pleural effusion. No pneumothorax. HEART AND MEDIASTINUM: No  acute abnormality of the cardiac and mediastinal silhouettes. BONES AND SOFT TISSUES: No acute osseous abnormality. IMPRESSION: 1. No acute process. Electronically signed by: Greig Pique MD 08/07/2024 04:45 PM EST RP Workstation: [id number]   (Echo, Carotid, EGD, Colonoscopy, ERCP)    Subjective: Resting in bed has not had a BM in 2 days tolerating a regular diet  Discharge Exam: Vitals:   08/28/24 0615 08/28/24 0804  BP: (!) 116/58   Pulse: 81   Resp: 14   Temp: 98 F (36.7 C)   SpO2: 94% 95%   Vitals:   08/27/24 1500 08/27/24 2053 08/28/24 0615 08/28/24 0804  BP: 115/71 126/80 (!) 116/58   Pulse: 74 64 81   Resp: 12 18 14    Temp: 98 F (36.7 C) 98.1 F (36.7 C) 98 F (36.7 C)   TempSrc: Oral     SpO2:  100% 94% 95%    General: Pt is alert, awake, not in acute distress Cardiovascular: RRR, S1/S2 +, no rubs, no gallops Respiratory: CTA bilaterally, no wheezing, no rhonchi Abdominal: Soft, NT, ND, bowel sounds + Extremities: no edema, no cyanosis    The results of significant diagnostics from this hospitalization (including imaging, microbiology, ancillary and laboratory) are listed below for reference.     Microbiology: Recent Results (from the past 240 hours)  C Difficile Quick Screen (NO PCR Reflex)     Status: Abnormal   Collection Time: 08/25/24  1:35 AM   Specimen: Stool  Result Value Ref Range Status   C Diff antigen POSITIVE (A) NEGATIVE Final   C Diff toxin NEGATIVE NEGATIVE Final   C Diff interpretation   Final    Results are indeterminate. Please contact the provider listed for your campus for C diff questions in AMION.    Comment: Performed at [facility], [address] [place], [place] [address]  Gastrointestinal Panel by PCR , Stool     Status: Abnormal   Collection Time: 08/25/24  1:35 AM   Specimen: Stool  Result Value Ref Range Status   Campylobacter species NOT DETECTED NOT DETECTED Final   Plesimonas shigelloides NOT DETECTED NOT DETECTED Final   Salmonella species NOT DETECTED NOT DETECTED Final   Yersinia enterocolitica NOT DETECTED NOT DETECTED Final   Vibrio species NOT DETECTED NOT  DETECTED Final   Vibrio cholerae NOT DETECTED NOT DETECTED Final   Enteroaggregative E coli (EAEC) DETECTED (A) NOT DETECTED Final    Comment: RESULT CALLED TO, READ BACK BY AND VERIFIED WITH: BOBBIE S., RN AT 1442 08/26/24 RAM    Enteropathogenic E coli (EPEC) NOT DETECTED NOT DETECTED Final   Enterotoxigenic E coli (ETEC) NOT DETECTED NOT DETECTED Final   Shiga like toxin producing E coli (STEC) NOT DETECTED NOT DETECTED Final   Shigella/Enteroinvasive E coli (EIEC) NOT DETECTED NOT DETECTED Final   Cryptosporidium NOT DETECTED NOT DETECTED Final   Cyclospora cayetanensis NOT DETECTED NOT DETECTED Final   Entamoeba histolytica NOT DETECTED NOT DETECTED Final   Giardia lamblia NOT DETECTED NOT DETECTED Final   Adenovirus F40/41 NOT DETECTED NOT DETECTED Final   Astrovirus NOT DETECTED NOT DETECTED Final   Norovirus GI/GII DETECTED (A) NOT DETECTED Final    Comment: RESULT CALLED TO, READ BACK BY AND VERIFIED WITH: BOBBIE S., RN AT 1442 08/26/24 RAM    Rotavirus A NOT DETECTED NOT DETECTED Final   Sapovirus (I, II, IV, and V) NOT DETECTED NOT DETECTED Final    Comment: Performed at [facility], [address] [place], [place] [address]     Labs: BNP (last 3 results) No results for input(s): BNP in the last 8760 hours. Basic Metabolic Panel: Recent Labs  Lab 08/23/24 0441 08/24/24 1045 08/25/24 0417 08/26/24 0420 08/27/24 1020 08/28/24 0451  NA 138 138 140 140 140 141  K 3.4* 3.7 3.5 3.5 3.7 3.8  CL 105 102 104 104 104 105  CO2 24 25 28 27 26 28   GLUCOSE 89 68* 98 77 85 81  BUN 10 6 <5* <5* <5* <5*  CREATININE 0.70 0.64 0.64 0.72 0.67 0.69  CALCIUM  8.0* 8.4* 8.7* 8.5* 8.6* 8.4*  MG 1.7  --   --   --   --   --   PHOS 3.0  --   --   --   --   --    Liver Function Tests: Recent Labs  Lab 08/24/24 1045 08/25/24 0417 08/26/24 0420 08/27/24 1020 08/28/24 0451  AST 109* 164* 145* 193* 83*  ALT 76* 103* 122* 192* 141*  ALKPHOS 147* 187* 202* 226* 193*   BILITOT 0.3 0.5 0.3 0.4 0.2  PROT 6.3* 6.7 6.6 6.5 6.2*  ALBUMIN 3.5 3.6 3.6 3.7 3.5   Recent Labs  Lab 08/22/24 0908 08/23/24 0441 08/24/24 1045 08/25/24 0417 08/27/24 1218  LIPASE 27 576* 141* 161* 85*   No results for input(s): AMMONIA in the last 168 hours. CBC: Recent Labs  Lab 08/23/24 0441 08/24/24 1045 08/25/24 0417 08/26/24 0420 08/27/24 1020 08/28/24 0451  WBC 7.0 9.1 12.0* 9.3 7.9 9.4  NEUTROABS 5.6  --   --   --  4.3  --   HGB 11.6* 11.6* 11.3* 11.6* 11.0* 10.3*  HCT 37.1 34.9* 35.0* 35.7* 34.0* 33.1*  MCV 89.6 86.6 88.2 86.9 86.7 89.5  PLT 251 266 218 250 315 302   Cardiac Enzymes: No results for input(s): CKTOTAL, CKMB, CKMBINDEX, TROPONINI in the last 168 hours. BNP: Invalid input(s): POCBNP CBG: Recent Labs  Lab 08/24/24 1751 08/25/24 0000  GLUCAP 64* 94   D-Dimer No results for input(s): DDIMER in the last 72 hours. Hgb A1c No results for input(s): HGBA1C in the last 72 hours. Lipid Profile No results for input(s): CHOL, HDL, LDLCALC, TRIG, CHOLHDL, LDLDIRECT in the last 72 hours. Thyroid  function studies No results for input(s): TSH, T4TOTAL, T3FREE, THYROIDAB in the last 72 hours.  Invalid input(s): FREET3 Anemia work up No results for input(s): VITAMINB12, FOLATE, FERRITIN, TIBC, IRON, RETICCTPCT in the last 72 hours. Urinalysis    Component Value Date/Time   COLORURINE YELLOW 08/22/2024 0908   APPEARANCEUR CLEAR 08/22/2024 0908   LABSPEC 1.019 08/22/2024 0908   PHURINE 6.0 08/22/2024 0908   GLUCOSEU NEGATIVE 08/22/2024 0908   GLUCOSEU NEGATIVE 01/03/2024 1228   HGBUR NEGATIVE 08/22/2024 0908   BILIRUBINUR NEGATIVE 08/22/2024 0908   BILIRUBINUR neg 08/15/2024 1703   KETONESUR NEGATIVE 08/22/2024 0908   PROTEINUR NEGATIVE 08/22/2024 0908   UROBILINOGEN 0.2 08/15/2024 1703   UROBILINOGEN 0.2 01/03/2024 1228   NITRITE NEGATIVE 08/22/2024 0908   LEUKOCYTESUR NEGATIVE 08/22/2024  0908   Sepsis Labs Recent Labs  Lab 08/25/24 0417 08/26/24 0420 08/27/24 1020 08/28/24 0451  WBC 12.0* 9.3 7.9 9.4   Microbiology Recent Results (from the past 240 hours)  C Difficile Quick Screen (NO PCR Reflex)     Status: Abnormal   Collection Time: 08/25/24  1:35 AM   Specimen: Stool  Result Value Ref Range Status   C Diff antigen POSITIVE (A) NEGATIVE Final  C Diff toxin NEGATIVE NEGATIVE Final   C Diff interpretation   Final    Results are indeterminate. Please contact the provider listed for your campus for C diff questions in AMION.    Comment: Performed at [facility], [address], [place], [place] [address]  Gastrointestinal Panel by PCR , Stool     Status: Abnormal   Collection Time: 08/25/24  1:35 AM   Specimen: Stool  Result Value Ref Range Status   Campylobacter species NOT DETECTED NOT DETECTED Final   Plesimonas shigelloides NOT DETECTED NOT DETECTED Final   Salmonella species NOT DETECTED NOT DETECTED Final   Yersinia enterocolitica NOT DETECTED NOT DETECTED Final   Vibrio species NOT DETECTED NOT DETECTED Final   Vibrio cholerae NOT DETECTED NOT DETECTED Final   Enteroaggregative E coli (EAEC) DETECTED (A) NOT DETECTED Final    Comment: RESULT CALLED TO, READ BACK BY AND VERIFIED WITH: BOBBIE S., RN AT 1442 08/26/24 RAM    Enteropathogenic E coli (EPEC) NOT DETECTED NOT DETECTED Final   Enterotoxigenic E coli (ETEC) NOT DETECTED NOT DETECTED Final   Shiga like toxin producing E coli (STEC) NOT DETECTED NOT DETECTED Final   Shigella/Enteroinvasive E coli (EIEC) NOT DETECTED NOT DETECTED Final   Cryptosporidium NOT DETECTED NOT DETECTED Final   Cyclospora cayetanensis NOT DETECTED NOT DETECTED Final   Entamoeba histolytica NOT DETECTED NOT DETECTED Final   Giardia lamblia NOT DETECTED NOT DETECTED Final   Adenovirus F40/41 NOT DETECTED NOT DETECTED Final   Astrovirus NOT DETECTED NOT DETECTED Final   Norovirus GI/GII DETECTED (A)  NOT DETECTED Final    Comment: RESULT CALLED TO, READ BACK BY AND VERIFIED WITH: BOBBIE S., RN AT 1442 08/26/24 RAM    Rotavirus A NOT DETECTED NOT DETECTED Final   Sapovirus (I, II, IV, and V) NOT DETECTED NOT DETECTED Final    Comment: Performed at [facility], [address] [place], [place] [address]     Time coordinating discharge:45 min SIGNED:   Almarie KANDICE Hoots, MD  [facility] 08/28/2024, 8:41 AM      [1]  Allergies Allergen Reactions   Duloxetine Hives   Scemblix [Asciminib] Other (See Comments)    Horrible pain   Blood Orange Os [Flavoring Agent] Other (See Comments)    Contraindicated with Sprycel    Bupropion Other (See Comments)    Skin was burning   Cefdinir Nausea Only and Other (See Comments)    Upset stomach, felt really bad   Grapefruit Oil Other (See Comments)    Contraindicated with Sprycel    Pomegranate (Punica Granatum) Other (See Comments)    Contraindicated with Sprycel    Vortioxetine Nausea And Vomiting

## 2024-08-29 ENCOUNTER — Telehealth: Payer: Self-pay | Admitting: *Deleted

## 2024-08-29 LAB — CALPROTECTIN, FECAL: Calprotectin, Fecal: 361 ug/g — ABNORMAL HIGH (ref 0–120)

## 2024-08-29 LAB — NOROVIRUS GROUP 1 & 2 BY PCR, STOOL
Norovirus 1 by PCR: NEGATIVE
Norovirus 2  by PCR: POSITIVE — AB

## 2024-08-29 LAB — PANCREATIC ELASTASE, FECAL: Pancreatic Elastase-1, Stool: >800 ug Elast./g

## 2024-08-29 LAB — CBG MONITORING, ED: Glucose-Capillary: 92 mg/dL (ref 70–99)

## 2024-08-29 NOTE — Discharge Instructions (Addendum)
 Today you were seen after a near syncopal episode.  I suspect this is likely due to standing up too quickly and reduced oral intake.  You may alternate Tylenol  Motrin  as needed for pain.  Please return to the ED if you have uncontrollable vomiting, double vision, or numbness/weakness.  Thank you for letting us  treat you today. After reviewing your labs and imaging, I feel you are safe to go home. Please follow up with your PCP in the next several days and provide them with your records from this visit. Return to the Emergency Room if pain becomes severe or symptoms worsen.

## 2024-08-29 NOTE — Transitions of Care (Post Inpatient/ED Visit) (Signed)
 "  08/29/2024  Name: Desiree Carlson MRN: [id number] DOB: July 02, 1996  Today's TOC FU Call Status: Today's TOC FU Call Status:: Successful TOC FU Call Completed TOC FU Call Complete Date: 08/29/24  Patient's Name and Date of Birth confirmed. Name, DOB  Transition Care Management Follow-up Telephone Call Date of Discharge: 08/28/24 Discharge Facility: [facility]) Type of Discharge: Inpatient Admission Primary Inpatient Discharge Diagnosis:: Acute pancreatitis How have you been since you were released from the hospital?: Same  Items Reviewed: Did you receive and understand the discharge instructions provided?: Yes Medications obtained,verified, and reconciled?: Yes (Medications Reviewed) Any new allergies since your discharge?: No Dietary orders reviewed?: Yes Type of Diet Ordered:: Low fat, heart healthy Do you have support at home?: Yes People in Home [RPT]: parent(s) Name of Support/Comfort Primary Source: [name]/Mother  Medications Reviewed Today: Medications Reviewed Today     Reviewed by Lucky Andrea LABOR, RN (Registered Nurse) on 08/29/24 at 1521  Med List Status: <None>   Medication Order Taking? Sig Documenting Provider Last Dose Status Informant  acetaminophen  (TYLENOL ) 500 MG tablet [id number] Yes Take 500-1,000 mg by mouth every 8 (eight) hours as needed for mild pain (pain score 1-3). [provider]  Active Self  albuterol  (VENTOLIN  HFA) 108 (90 Base) MCG/ACT inhaler [id number] Yes Inhale 1-2 puffs into the lungs every 6 (six) hours as needed for wheezing or shortness of breath. Geofm Glade PARAS, MD  Active Self  amphetamine -dextroamphetamine  (ADDERALL XR) 30 MG 24 hr capsule [id number] Yes Take 30 mg by mouth every morning. [provider]  Active Self  budesonide -formoterol  (SYMBICORT ) 160-4.5 MCG/ACT inhaler [id number] Yes Inhale 2 puffs into the lungs 2 (two) times daily as needed. Geofm Glade PARAS, MD  Active Self  CALCIUM  PO [id number] Yes Take 1 tablet  by mouth See admin instructions. Calcium  gummie - Chew 1 gummie by mouth once a day [provider]  Active Self           Med Note MARISA, NATHANEL LOISE [place] Aug 22, 2024  6:21 PM) Strength not noted  Cholecalciferol (VITAMIN D3) 1000 units CAPS [id number] Yes Take 1,000 Units by mouth daily. [provider]  Active Self  clobetasol  ointment (TEMOVATE ) 0.05 % [id number]  Apply 1 Application topically 2 (two) times daily. Place in a thin layer of the affected areas twice a day for 2 weeks.  Then place on the affected areas twice a week at bedtime.  Patient not taking: Reported on 08/29/2024   Amundson C Silva, Brook E, MD  Active Self  cyanocobalamin  100 MCG tablet [id number] Yes Take 100 mcg by mouth. [provider]  Active Self  dasatinib  (SPRYCEL ) 50 MG tablet [id number] Yes Take 50 mg by mouth at bedtime. [provider]  Active Self  DERMOTIC 0.01 % OIL [id number] Yes Place 3 drops into both ears See admin instructions. PLACE 3 DROPS INTO BOTH EARS TWICE A DAY ON WED & SAT [provider]  Active Self  diazepam  (VALIUM ) 5 MG tablet [id number]  Place 1 tablet vaginally nightly as needed for muscle spasm/ pelvic pain.  Patient not taking: Reported on 08/29/2024   Zuleta, Kaitlin G, NP  Active Self  fluticasone  (FLONASE) 50 MCG/ACT nasal spray [id number] Yes Place 1 spray into both nostrils 2 (two) times daily as needed (for seasonal allergies). [provider]  Active Self  gabapentin (NEURONTIN) 100 MG capsule [id number] Yes Take 100 mg by mouth daily as needed (for pain). [provider]  Active Self  loperamide (IMODIUM) 2 MG capsule [id number] Yes Take 2 mg by mouth daily as needed for diarrhea or loose stools. [provider]  Active Self  loratadine (CLARITIN) 10 MG tablet [id number] Yes Take 10 mg by mouth daily as needed (for seasonal allergies). [provider]  Active Self  LORazepam  (ATIVAN ) 1 MG tablet [id number] Yes Take  1 mg by mouth 2 (two) times daily as needed for anxiety. [provider]  Active Self  ondansetron  (ZOFRAN ) 4 MG tablet [id number] Yes Take 1 tablet (4 mg total) by mouth every 6 (six) hours as needed for nausea. Will Almarie MATSU, MD  Active   oxyCODONE  (OXY IR/ROXICODONE ) 5 MG immediate release tablet [id number] Yes Take 1 tablet (5 mg total) by mouth every 4 (four) hours as needed for moderate pain (pain score 4-6). Will Almarie MATSU, MD  Active   pilocarpine [facility]) 5 MG tablet [id number] Yes Take 5 mg by mouth in the morning and at bedtime. [provider]  Active Self  prochlorperazine  (COMPAZINE ) 10 MG tablet [id number] Yes Take 10 mg by mouth every 6 (six) hours as needed for nausea or vomiting. [provider]  Active Self  rizatriptan (MAXALT) 10 MG tablet [id number] Yes Take 10 mg by mouth daily as needed for migraine. [provider]  Active Self  simethicone  (MYLICON) 80 MG chewable tablet [id number] Yes Chew 1 tablet (80 mg total) by mouth every 6 (six) hours as needed for flatulence. Will Almarie MATSU, MD  Active   tiZANidine  (ZANAFLEX ) 2 MG tablet [id number] Yes Take 2 mg by mouth at bedtime. [provider]  Active Self  venlafaxine  XR (EFFEXOR -XR) 150 MG 24 hr capsule [id number] Yes Take 150 mg by mouth at bedtime. [provider]  Active Self  Med List Note Carmella Asberry FALCON, [facility] 03/30/21 1355): [place]  filled through [facility]            Home Care and Equipment/Supplies: Were Home Health Services Ordered?: No Any new equipment or medical supplies ordered?: No  Functional Questionnaire: Do you need assistance with bathing/showering or dressing?: No Do you need assistance with meal preparation?: No Do you need assistance with eating?: No Do you have difficulty maintaining continence: No Do you need assistance with getting out of bed/getting out of a chair/moving?: No Do you have difficulty managing or  taking your medications?: No  Follow up appointments reviewed: PCP Follow-up appointment confirmed?: No (Assisted with scheduling on 09/04/24) MD Provider Line Number:[phone] Given: No Specialist Hospital Follow-up appointment confirmed?: No Reason Specialist Follow-Up Not Confirmed: Patient has Specialist Provider Number and will Call for Appointment Do you need transportation to your follow-up appointment?: No Do you understand care options if your condition(s) worsen?: Yes-patient verbalized understanding  SDOH Interventions Today    Flowsheet Row Most Recent Value  SDOH Interventions   Food Insecurity Interventions Intervention Not Indicated  Housing Interventions Intervention Not Indicated  Transportation Interventions Intervention Not Indicated  Utilities Interventions Intervention Not Indicated    Goals Addressed             This Visit's Progress    [facility] Transitions of Care (TOC) Care Plan       Problems:  Recent Hospitalization for treatment of Acute Pancreatitis No Hospital Follow Up Provider appointment RNCM will assist with scheduling  Goal:  Over the next 30 days, the patient will not experience hospital readmission  Interventions:  Transitions of Care: Doctor Visits  - discussed the importance of doctor  visits Post discharge activity limitations prescribed by provider reviewed Medication reviewed discussed pain management and list of stopped medications Reviewed diet Reviewed the importance of rescheduling with GI RNCM assisted with scheduling hospital follow up with PCP on 09/04/24 Reviewed upcoming appointments including:Psychiatry on 09/03/24, Optometry on 09/11/24, Psychology on 09/13/24, Oncology on 09/18/24, Neurology on 10/16/24  Patient Self Care Activities:  Attend all scheduled provider appointments Call pharmacy for medication refills 3-7 days in advance of running out of medications Call provider office for new concerns or questions  Notify RN Care  Manager of [facility] call rescheduling needs Participate in Transition of Care Program/Attend TOC scheduled calls Take medications as prescribed    Plan:  Telephone follow up appointment with care management team member scheduled for:  09/06/24 at 2pm       Discussed and offered 30 day TOC program.  Patient     enrolled .  The patient has been provided with contact information for the care management team and has been advised to call with any health -related questions or concerns.  The patient verbalized understanding with current plan of care.  The patient is directed to their insurance card regarding availability of benefits coverage.   Andrea Dimes RN, BSN Center  [facility] RN Care Manager [phone]  "

## 2024-09-04 ENCOUNTER — Inpatient Hospital Stay: Admitting: Internal Medicine

## 2024-09-06 ENCOUNTER — Other Ambulatory Visit: Payer: Self-pay | Admitting: *Deleted

## 2024-09-06 ENCOUNTER — Encounter: Payer: Self-pay | Admitting: Internal Medicine

## 2024-09-06 NOTE — Progress Notes (Unsigned)
 "     Subjective:    Patient ID: Desiree Carlson, female    DOB: 12/02/1995, 29 y.o.   MRN: [id number]     HPI Desiree Carlson is here for hospital follow-up.  Admitted 1/21-1/27 for acute pancreatitis.  She presented with intractable nausea, abdominal discomfort and whole body ache.  That day she started having continuous nausea and vomiting.  She had moderate-severe epigastric pain without radiation.  2 days prior she did have 5 alcoholic drinks.  WBC 15.9, lipase elevated.  CT scan of the abdomen and pelvis without contrast was positive for [facility] pancreatic inflammation without complication.  Biliary tree was normal.  She received multiple doses of pain medication and 3 L of IVF.  She denies history of pancreatitis.  Acute pancreatitis-first episode Presented with nausea, vomiting and abdominal pain No biliary dilation or gallstones Triglycerides 88, lipase 576 -> 85 Stool C. difficile positive with antigen negative for toxin, GI profile positive for E. coli and norovirus.  Treated conservatively MRCP shows improved pancreatitis, no gallstones or biliary ductal dilation, mild hepatic steatosis  Leukemia on Dasatinib -followed at [facility]  History of endometriosis followed at [facility]  Anxiety, depression, PTSD-Effexor , alprazolam  as needed  Chronic pain syndrome, fibromyalgia-Home meds, supportive measures  Abnormal LFTs-trended downward AST 83 from 193, ALT 141 from 192, alk phos 193 from 226 Abdominal ultrasound without evidence of stone  Diarrhea-resolved C. difficile antigen positive, toxin negative, mild leukocytosis, GI pathogen panel positive for norovirus and E. coli   ED 1/27 for near syncope  Presented after ground-level fall due to near syncope.  She stated right hip pain, right rib pain, tinnitus, blurred vision, headache and neck pain.  She denied any head injury, nausea, fever, numbness, weakness, loss of bowel/bladder, saddle anesthesia.  CMP  with persistent elevation of LFTs, with some improvement, kidney function normal, mild anemia, UA negative, CT head negative, CT cervical spine negative, x-ray ribs/chest negative, x-ray hip negative.  EKG without change.  Tele negative.  Discharged home advised to alternate Tylenol  and Motrin .   She did see GI at [facility] on 2/3, who felt her pancreatitis may have been related to her recent URI when she saw him she was still having severe abdominal pain on both sides of her abdomen that radiated to the back.  She stated blood in the stool and on the toilet paper.  She has difficulty passing stool.  She stated nausea and pain preventing her from eating normal portions.  Iral infection.  He felt the pancreatitis was resolved.  He did reorder Amitiza for her to start taking.  She has no rectal dyssynergia.  IBD has been ruled out.    Discussed the use of AI scribe software for clinical note transcription with the patient, who gave verbal consent to proceed.  History of Present Illness Desiree Carlson is a 29 year old female with chronic myeloid leukemia who presents with severe fatigue, abdominal symptoms, and generalized pain.  She experiences persistent severe fatigue, generalized body pain, bone pain, and recurrent fevers. Swollen lymph nodes are present throughout her body, including the neck, armpits, chest, abdomen, and groin. These symptoms have significantly impacted her quality of life, making her feel as though her body is 'shutting down'.  She has a history of pancreatitis and was recently hospitalized for this condition. However, she continues to experience abdominal pain, described as a 'U-shaped' pain across her abdomen, with tenderness on the ribs. She also reports a history of fainting  episodes, which occur without warning, unlike her previous experiences with fainting.  She experiences night sweats, waking up drenched and shivering, and reports feeling weak and in pain upon waking. She  has a history of low blood sugar and experiences headaches, swelling in her head, and shortness of breath, which she describes as feeling like asthma attacks.  She has a history of chronic pain, including muscle, bone, and joint pain, and has been seeing a spine pain management specialist for trigger point injections. Her pain is severe and occurs daily. She also experiences rashes, described as 'little pin prick type things' on her shoulders, neck, and face, which are sometimes itchy.  She is concerned about the possibility of other cancers due to her symptoms. She has not had a biopsy of her lymph nodes and is experiencing difficulty coordinating care between her specialists.     Medications and allergies reviewed with patient and updated if appropriate.  Medications Ordered Prior to Encounter[1]   Review of Systems  Constitutional:  Positive for diaphoresis and fever.  Respiratory:  Positive for cough (a little - more when laying down or moving), shortness of breath (intermittent) and wheezing (a little - more when laying down or moving).   Cardiovascular:  Positive for chest pain. Negative for palpitations and leg swelling.  Gastrointestinal:  Positive for abdominal pain (across upper abdomen), constipation and diarrhea.  Musculoskeletal:  Positive for arthralgias, back pain and myalgias.       Full body pain  Skin:  Positive for rash (pinprick  lesions throughout body - sometimes itchy).  Neurological:  Positive for dizziness, light-headedness and headaches.  Hematological:  Positive for adenopathy.       Objective:   Vitals:   09/07/24 0810  BP: 108/74  Pulse: 68  Temp: 98.1 F (36.7 C)  SpO2: 99%   BP Readings from Last 3 Encounters:  09/07/24 108/74  08/29/24 (!) 132/93  08/28/24 (!) 116/58   Wt Readings from Last 3 Encounters:  09/07/24 211 lb (95.7 kg)  08/29/24 215 lb (97.5 kg)  08/17/24 220 lb (99.8 kg)   Body mass index is 31.16 kg/m.    Physical  Exam Constitutional:      General: She is not in acute distress.    Appearance: Normal appearance. She is not ill-appearing.  HENT:     Head: Normocephalic and atraumatic.  Eyes:     Conjunctiva/sclera: Conjunctivae normal.  Cardiovascular:     Rate and Rhythm: Normal rate and regular rhythm.  Pulmonary:     Effort: Pulmonary effort is normal. No respiratory distress.     Breath sounds: Normal breath sounds. No wheezing or rales.  Abdominal:     General: There is no distension.     Palpations: Abdomen is soft. There is no mass.     Tenderness: There is abdominal tenderness (Across upper abdomen and across lower abdomen). There is no guarding or rebound.  Musculoskeletal:     Cervical back: Neck supple.     Right lower leg: No edema.     Left lower leg: No edema.  Lymphadenopathy:     Cervical: Cervical adenopathy (Chronic) present.  Skin:    General: Skin is warm and dry.     Comments: Single papules/scabs on arms  Neurological:     Mental Status: She is alert.        Lab Results  Component Value Date   WBC 9.8 08/28/2024   HGB 11.6 (L) 08/28/2024   HCT 35.4 (L) 08/28/2024  PLT 360 08/28/2024   GLUCOSE 89 08/28/2024   CHOL 153 08/23/2024   TRIG 145 08/23/2024   HDL 33 (L) 08/23/2024   LDLCALC 91 08/23/2024   ALT 126 (H) 08/28/2024   AST 60 (H) 08/28/2024   NA 139 08/28/2024   K 3.8 08/28/2024   CL 104 08/28/2024   CREATININE 0.68 08/28/2024   BUN <5 (L) 08/28/2024   CO2 24 08/28/2024   TSH 1.56 01/03/2024   HGBA1C 5.0 03/23/2022   DG Hip Unilat W or Wo Pelvis 2-3 Views Right EXAM: 2 or 3 VIEW(S) XRAY OF THE HIP 08/28/2024 11:38:51 PM  COMPARISON: None available.  CLINICAL HISTORY: Recent fall and shoulder with hip pain, initial encounter.  FINDINGS:  BONES AND JOINTS: The pelvic ring is intact. No acute fracture or dislocation is noted. No malalignment.  SOFT TISSUES: No soft tissue abnormality is seen.  IMPRESSION: 1. No acute fracture or  dislocation in the right hip or pelvis.  Electronically signed by: Oneil Devonshire MD 08/28/2024 11:43 PM EST RP Workstation: [id number] DG Ribs Unilateral W/Chest Right EXAM: 1 AP VIEW XRAY OF THE RIGHT RIBS AND CHEST 08/28/2024 11:38:51 PM  COMPARISON: 12/31/2024  CLINICAL HISTORY: fall and shower with right-sided rib pain  FINDINGS:  BONES: No acute displaced rib fracture.  LUNGS AND PLEURA: The lungs are clear. No pleural effusion or pneumothorax.  HEART AND MEDIASTINUM: The cardiac shadow is within normal limits. No acute abnormality of the mediastinal silhouette.  IMPRESSION: 1. No acute rib fracture. 2. No acute cardiopulmonary abnormality.  Electronically signed by: Oneil Devonshire MD 08/28/2024 11:42 PM EST RP Workstation: [id number] CT Cervical Spine Wo Contrast EXAM: CT CERVICAL SPINE WITHOUT CONTRAST 08/28/2024 11:28:24 PM  TECHNIQUE: CT of the cervical spine was performed without the administration of intravenous contrast. Multiplanar reformatted images are provided for review. Automated exposure control, iterative reconstruction, and/or weight based adjustment of the mA/kV was utilized to reduce the radiation dose to as low as reasonably achievable.  COMPARISON: None available.  CLINICAL HISTORY: Neck trauma, midline tenderness (Age 60-64y) Neck trauma, midline tenderness. Age 60-64 years.  FINDINGS:  BONES AND ALIGNMENT: No acute fracture or traumatic malalignment.  DEGENERATIVE CHANGES: No significant degenerative changes.  SOFT TISSUES: No prevertebral soft tissue swelling.  IMPRESSION: 1. No significant abnormality  Electronically signed by: Morgane Naveau MD 08/28/2024 11:35 PM EST RP Workstation: [id number] CT Head Wo Contrast EXAM: CT HEAD WITHOUT CONTRAST 08/28/2024 11:28:24 PM  TECHNIQUE: CT of the head was performed without the administration of intravenous contrast. Automated exposure control, iterative reconstruction, and/or  weight based adjustment of the mA/kV was utilized to reduce the radiation dose to as low as reasonably achievable.  COMPARISON: 04/02/2022  CLINICAL HISTORY: Head trauma, abnormal mental status (Age 48-64 years).  FINDINGS:  BRAIN AND VENTRICLES: No acute hemorrhage. No evidence of acute infarct. No hydrocephalus. No extra-axial collection. No mass effect or midline shift.  ORBITS: No acute abnormality.  SINUSES: No acute abnormality.  SOFT TISSUES AND SKULL: No acute soft tissue abnormality. No skull fracture.  IMPRESSION: 1. No acute intracranial abnormality.  Electronically signed by: Morgane Naveau MD 08/28/2024 11:33 PM EST RP Workstation: [id number]    Assessment & Plan:    See Problem List for Assessment and Plan of chronic medical problems.     Assessment and Plan Assessment & Plan Alcohol -induced acute pancreatitis Pancreatitis nearly resolved. Liver tests slightly elevated, expected to normalize. - Rechecked liver tests to monitor resolution.  Chronic fatigue syndrome Severe fatigue persists, affecting quality  of life. No clear cause identified. - Referred to infectious disease specialist at [facility] for further evaluation. - Referred to allergy and immunology specialist at [facility] for immunological evaluation. - Consider referral to undiagnosed disease network at [facility] for comprehensive evaluation.  Whole body pain-states joint pain, muscle pain, bone pain-?  Fibromyalgia.  Big impact on quality of life - ANA, RF negative in the past - Has seen rheumatology in the past and they did not feel she had an autoimmune disease - Consider referral to undiagnosed disease network at [facility] for comprehensive evaluation.  Generalized lymphadenopathy Persistent lymphadenopathy with no clear cause.  Has seen ENT and her oncologist for this in the past.  Her oncologist discussed potential removal of the lymph node to have it assessed.  ENT discussed FNA. - Follow-up with  her oncologist for potential biopsy of lymph nodes.   Syncope Recent fainting episode post-hospitalization, possible cardiac cause since there were no prodromal symptoms like her fainting episodes in the past. - Referred to cardiology at [facility] for further evaluation, including potential Holter monitor and echocardiogram.  Chronic infection She is very concerned that her symptoms are suggestive of chronic infection.  She has seen infectious disease in the past and would like to see them.  She knows she is immunocompromised, but is concerned about prolonged infections, indwelling infections. - Referred to infectious disease specialist at [facility] for further evaluation. - Referred to allergy and immunology specialist at [facility] for immunological evaluation.  Vitamin B12 deficiency Previous low B12 levels, supplementation ongoing. - Checked vitamin B12 levels to assess absorption and efficacy of supplementation.  Vitamin D  deficiency Previous low vitamin D  levels, supplementation ongoing. - Checked vitamin D  levels to assess absorption and efficacy of supplementation.  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 - Prescribed Amitiza by her gastroenterologist to help resolve constipation and diarrhea. - Adjust Miralax , stool softeners and Metamucil regimen to find balance between constipation and diarrhea.   - She would like to be referred to undiagnosed disease network at [facility] for comprehensive evaluation.  She needs to get her imaging results together and see exactly what they need and will let me know.      [1]  Current Outpatient Medications on File Prior to Visit  Medication Sig Dispense Refill   amphetamine -dextroamphetamine  (ADDERALL) 10 MG tablet Take 10 mg by mouth daily.     nystatin -triamcinolone  (MYCOLOG II) cream Apply topically.     acetaminophen  (TYLENOL ) 500 MG tablet Take  500-1,000 mg by mouth every 8 (eight) hours as needed for mild pain (pain score 1-3).     albuterol  (VENTOLIN  HFA) 108 (90 Base) MCG/ACT inhaler Inhale 1-2 puffs into the lungs every 6 (six) hours as needed for wheezing or shortness of breath. 6.7 g 5   amphetamine -dextroamphetamine  (ADDERALL XR) 30 MG 24 hr capsule Take 30 mg by mouth every morning.     baclofen (LIORESAL) 10 MG tablet Take 10 mg by mouth 2 (two) times daily as needed for muscle spasms.     budesonide -formoterol  (SYMBICORT ) 160-4.5 MCG/ACT inhaler Inhale 2 puffs into the lungs 2 (two) times daily as needed. 10.2 g 8   CALCIUM  PO Take 1 tablet by mouth See admin instructions. Calcium  gummie - Chew 1 gummie by mouth once a day     Cholecalciferol (VITAMIN D3) 1000 units CAPS Take 1,000 Units by mouth daily.     clobetasol  ointment (TEMOVATE ) 0.05 % Apply 1 Application topically  2 (two) times daily. Place in a thin layer of the affected areas twice a day for 2 weeks.  Then place on the affected areas twice a week at bedtime. (Patient taking differently: Apply 1 Application topically daily as needed. Place in a thin layer of the affected areas twice a day for 2 weeks.  Then place on the affected areas twice a week at bedtime.) 60 g 1   cyanocobalamin  100 MCG tablet Take 100 mcg by mouth.     dasatinib  (SPRYCEL ) 50 MG tablet Take 50 mg by mouth at bedtime.     DERMOTIC 0.01 % OIL Place 3 drops into both ears See admin instructions. PLACE 3 DROPS INTO BOTH EARS TWICE A DAY ON WED & SAT (Patient taking differently: Place 3 drops into both ears daily as needed. PLACE 3 DROPS INTO BOTH EARS TWICE A DAY ON WED & SAT)     diazepam  (VALIUM ) 5 MG tablet Place 1 tablet vaginally nightly as needed for muscle spasm/ pelvic pain. (Patient not taking: Reported on 09/06/2024) 30 tablet 1   fluticasone  (FLONASE) 50 MCG/ACT nasal spray Place 1 spray into both nostrils 2 (two) times daily as needed (for seasonal allergies).     gabapentin (NEURONTIN) 100 MG  capsule Take 100 mg by mouth daily as needed (for pain).     loperamide (IMODIUM) 2 MG capsule Take 2 mg by mouth daily as needed for diarrhea or loose stools.     loratadine (CLARITIN) 10 MG tablet Take 10 mg by mouth daily as needed (for seasonal allergies).     LORazepam  (ATIVAN ) 1 MG tablet Take 1 mg by mouth 2 (two) times daily as needed for anxiety.     lubiprostone (AMITIZA) 8 MCG capsule Take 8 mcg by mouth 2 (two) times daily with a meal. (Patient not taking: Reported on 09/06/2024)     ondansetron  (ZOFRAN ) 4 MG tablet Take 1 tablet (4 mg total) by mouth every 6 (six) hours as needed for nausea. 20 tablet 0   oxyCODONE  (OXY IR/ROXICODONE ) 5 MG immediate release tablet Take 1 tablet (5 mg total) by mouth every 4 (four) hours as needed for moderate pain (pain score 4-6). 30 tablet 0   pilocarpine (SALAGEN) 5 MG tablet Take 5 mg by mouth in the morning and at bedtime.     prochlorperazine  (COMPAZINE ) 10 MG tablet Take 10 mg by mouth every 6 (six) hours as needed for nausea or vomiting.     rizatriptan (MAXALT) 10 MG tablet Take 10 mg by mouth daily as needed for migraine.     simethicone  (MYLICON) 80 MG chewable tablet Chew 1 tablet (80 mg total) by mouth every 6 (six) hours as needed for flatulence. 30 tablet 0   tiZANidine  (ZANAFLEX ) 2 MG tablet Take 2 mg by mouth at bedtime. (Patient not taking: Reported on 09/06/2024)     venlafaxine  XR (EFFEXOR -XR) 150 MG 24 hr capsule Take 150 mg by mouth at bedtime.     No current facility-administered medications on file prior to visit.   "

## 2024-09-06 NOTE — Patient Instructions (Signed)
 Visit Information  Thank you for taking time to visit with me today. Please don't hesitate to contact me if I can be of assistance to you before our next scheduled telephone appointment.   Following is a copy of your care plan:   Goals Addressed             This Visit's Progress    VBC[facility] Transitions of Care (TOC) Care Plan       Problems:  Recent Hospitalization for treatment of Acute Pancreatitis No Hospital Follow Up Provider appointment RNCM will assist with scheduling  Goal:  Over the next 30 days, the patient will not experience hospital readmission  Interventions:  Transitions of Care: Doctor Visits  - discussed the importance of doctor visits Post discharge activity limitations prescribed by provider reviewed Medication reviewed discussed pain management and list of stopped medications Reviewed GI Specialist note and discussed Reviewed upcoming appointments including: PCP on 09/07/24, Optometry on 09/11/24, Psychology on 09/13/24, Oncology on 09/18/24, Neurology on 10/16/24 Advised patient to create a list of symptoms to discuss with PCP-may need to schedule a separate appointment to address full body concerns  Patient Self Care Activities:  Attend all scheduled provider appointments Call pharmacy for medication refills 3-7 days in advance of running out of medications Call provider office for new concerns or questions  Notify RN Care Manager of [facility] call rescheduling needs Participate in Transition of Care Program/Attend TOC scheduled calls Take medications as prescribed    Plan:  Telephone follow up appointment with care management team member scheduled for:  09/14/24 at 10am        Patient verbalizes understanding of instructions and care plan provided today and agrees to view in MyChart. Active MyChart status and patient understanding of how to access instructions and care plan via MyChart confirmed with patient.     Telephone follow up appointment with care  management team member scheduled for:09/14/24 at 10am  Please call the care guide team at [phone] if you need to cancel or reschedule your appointment.   Please call [phone]-TALK (toll free, 24 hour hotline) go to [facility] Urgent [facility] [address] [place] [phone]) call 911 if you are experiencing a Mental Health or Behavioral Health Crisis or need someone to talk to.  Andrea Dimes RN, BSN [place]  [facility] RN Care Manager [phone]
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
## 2024-09-06 NOTE — Assessment & Plan Note (Signed)
 Admitted 1/21-1/27-first episode Hospitalist thought it was related to the few alcoholic drinks she had a couple of days prior She had seen GI and they thought possibly related to viral infection MRCP, abdominal ultrasound without biliary dilation or evidence of stone

## 2024-09-07 ENCOUNTER — Ambulatory Visit: Admitting: Internal Medicine

## 2024-09-07 VITALS — BP 108/74 | HR 68 | Temp 98.1°F | Ht 69.0 in | Wt 211.0 lb

## 2024-09-07 DIAGNOSIS — B999 Unspecified infectious disease: Secondary | ICD-10-CM

## 2024-09-07 DIAGNOSIS — K852 Alcohol induced acute pancreatitis without necrosis or infection: Secondary | ICD-10-CM

## 2024-09-07 DIAGNOSIS — E538 Deficiency of other specified B group vitamins: Secondary | ICD-10-CM

## 2024-09-07 DIAGNOSIS — E559 Vitamin D deficiency, unspecified: Secondary | ICD-10-CM | POA: Insufficient documentation

## 2024-09-07 DIAGNOSIS — R55 Syncope and collapse: Secondary | ICD-10-CM

## 2024-09-07 DIAGNOSIS — Z0184 Encounter for antibody response examination: Secondary | ICD-10-CM

## 2024-09-07 LAB — HEPATIC FUNCTION PANEL
ALT: 19 U/L (ref 3–35)
AST: 23 U/L (ref 5–37)
Albumin: 4.3 g/dL (ref 3.5–5.2)
Alkaline Phosphatase: 100 U/L (ref 39–117)
Bilirubin, Direct: 0 mg/dL — ABNORMAL LOW (ref 0.1–0.3)
Total Bilirubin: 0.3 mg/dL (ref 0.2–1.2)
Total Protein: 7.7 g/dL (ref 6.0–8.3)

## 2024-09-07 LAB — VITAMIN B12: Vitamin B-12: 389 pg/mL (ref 211–911)

## 2024-09-07 LAB — VITAMIN D 25 HYDROXY (VIT D DEFICIENCY, FRACTURES): VITD: 19.39 ng/mL — ABNORMAL LOW (ref 30.00–100.00)

## 2024-09-07 NOTE — Patient Instructions (Addendum)
" ° ° ° ° °  Blood work was ordered.       Medications changes include :   None    A referral was ordered cardiology at [facility], allergy/immunology at [facility] and ID at [facility] and someone will call you to schedule an appointment.    "

## 2024-09-11 ENCOUNTER — Inpatient Hospital Stay: Admitting: Internal Medicine

## 2024-09-14 ENCOUNTER — Telehealth: Admitting: *Deleted

## 2025-04-30 ENCOUNTER — Ambulatory Visit: Admitting: Obstetrics and Gynecology
